# Patient Record
Sex: Female | Born: 1950 | Race: White | Hispanic: No | Marital: Married | Smoking: Never smoker
Health system: Southern US, Community
[De-identification: ages and names within clinical notes are randomized; demographics above are authoritative.]

## PROBLEM LIST (undated history)

## (undated) DIAGNOSIS — K589 Irritable bowel syndrome without diarrhea: Secondary | ICD-10-CM

## (undated) DIAGNOSIS — F32A Depression, unspecified: Secondary | ICD-10-CM

## (undated) DIAGNOSIS — K219 Gastro-esophageal reflux disease without esophagitis: Secondary | ICD-10-CM

## (undated) DIAGNOSIS — E079 Disorder of thyroid, unspecified: Secondary | ICD-10-CM

## (undated) DIAGNOSIS — E7800 Pure hypercholesterolemia, unspecified: Secondary | ICD-10-CM

## (undated) DIAGNOSIS — E785 Hyperlipidemia, unspecified: Secondary | ICD-10-CM

## (undated) DIAGNOSIS — F329 Major depressive disorder, single episode, unspecified: Secondary | ICD-10-CM

## (undated) DIAGNOSIS — I639 Cerebral infarction, unspecified: Secondary | ICD-10-CM

## (undated) DIAGNOSIS — M1990 Unspecified osteoarthritis, unspecified site: Secondary | ICD-10-CM

## (undated) DIAGNOSIS — I10 Essential (primary) hypertension: Secondary | ICD-10-CM

## (undated) DIAGNOSIS — E039 Hypothyroidism, unspecified: Secondary | ICD-10-CM

## (undated) HISTORY — DX: Cerebral infarction, unspecified: I63.9

## (undated) HISTORY — PX: TRIGGER FINGER RELEASE: SHX641

## (undated) HISTORY — DX: Hyperlipidemia, unspecified: E78.5

## (undated) HISTORY — PX: CHOLECYSTECTOMY: SHX55

## (undated) HISTORY — PX: APPENDECTOMY: SHX54

## (undated) HISTORY — PX: THYROIDECTOMY: SHX17

## (undated) HISTORY — PX: ABDOMINAL HYSTERECTOMY: SHX81

## (undated) HISTORY — PX: EYE SURGERY: SHX253

---

## 2014-10-27 DIAGNOSIS — Z1211 Encounter for screening for malignant neoplasm of colon: Secondary | ICD-10-CM | POA: Diagnosis not present

## 2014-10-27 DIAGNOSIS — K219 Gastro-esophageal reflux disease without esophagitis: Secondary | ICD-10-CM | POA: Diagnosis not present

## 2014-10-27 DIAGNOSIS — Z1239 Encounter for other screening for malignant neoplasm of breast: Secondary | ICD-10-CM | POA: Diagnosis not present

## 2014-10-27 DIAGNOSIS — R000 Tachycardia, unspecified: Secondary | ICD-10-CM | POA: Diagnosis not present

## 2014-10-29 DIAGNOSIS — R000 Tachycardia, unspecified: Secondary | ICD-10-CM | POA: Diagnosis not present

## 2014-11-29 DIAGNOSIS — L570 Actinic keratosis: Secondary | ICD-10-CM | POA: Diagnosis not present

## 2014-11-29 DIAGNOSIS — L578 Other skin changes due to chronic exposure to nonionizing radiation: Secondary | ICD-10-CM | POA: Diagnosis not present

## 2014-11-29 DIAGNOSIS — Z85828 Personal history of other malignant neoplasm of skin: Secondary | ICD-10-CM | POA: Diagnosis not present

## 2014-12-06 DIAGNOSIS — Z1211 Encounter for screening for malignant neoplasm of colon: Secondary | ICD-10-CM | POA: Diagnosis not present

## 2014-12-06 DIAGNOSIS — Z85828 Personal history of other malignant neoplasm of skin: Secondary | ICD-10-CM | POA: Diagnosis not present

## 2014-12-06 DIAGNOSIS — D139 Benign neoplasm of ill-defined sites within the digestive system: Secondary | ICD-10-CM | POA: Diagnosis not present

## 2014-12-06 DIAGNOSIS — D123 Benign neoplasm of transverse colon: Secondary | ICD-10-CM | POA: Diagnosis not present

## 2014-12-06 DIAGNOSIS — K5190 Ulcerative colitis, unspecified, without complications: Secondary | ICD-10-CM | POA: Diagnosis not present

## 2014-12-06 DIAGNOSIS — K633 Ulcer of intestine: Secondary | ICD-10-CM | POA: Diagnosis not present

## 2014-12-06 DIAGNOSIS — D126 Benign neoplasm of colon, unspecified: Secondary | ICD-10-CM | POA: Diagnosis not present

## 2014-12-06 DIAGNOSIS — Z86010 Personal history of colonic polyps: Secondary | ICD-10-CM | POA: Diagnosis not present

## 2014-12-27 DIAGNOSIS — R197 Diarrhea, unspecified: Secondary | ICD-10-CM | POA: Diagnosis not present

## 2014-12-27 DIAGNOSIS — E780 Pure hypercholesterolemia: Secondary | ICD-10-CM | POA: Diagnosis not present

## 2014-12-27 DIAGNOSIS — R000 Tachycardia, unspecified: Secondary | ICD-10-CM | POA: Diagnosis not present

## 2014-12-27 DIAGNOSIS — K219 Gastro-esophageal reflux disease without esophagitis: Secondary | ICD-10-CM | POA: Diagnosis not present

## 2014-12-27 DIAGNOSIS — I10 Essential (primary) hypertension: Secondary | ICD-10-CM | POA: Diagnosis not present

## 2014-12-27 DIAGNOSIS — G4700 Insomnia, unspecified: Secondary | ICD-10-CM | POA: Diagnosis not present

## 2014-12-27 DIAGNOSIS — F329 Major depressive disorder, single episode, unspecified: Secondary | ICD-10-CM | POA: Diagnosis not present

## 2014-12-27 DIAGNOSIS — E890 Postprocedural hypothyroidism: Secondary | ICD-10-CM | POA: Diagnosis not present

## 2014-12-29 DIAGNOSIS — L259 Unspecified contact dermatitis, unspecified cause: Secondary | ICD-10-CM | POA: Diagnosis not present

## 2014-12-29 DIAGNOSIS — D485 Neoplasm of uncertain behavior of skin: Secondary | ICD-10-CM | POA: Diagnosis not present

## 2014-12-29 DIAGNOSIS — L570 Actinic keratosis: Secondary | ICD-10-CM | POA: Diagnosis not present

## 2014-12-29 DIAGNOSIS — L821 Other seborrheic keratosis: Secondary | ICD-10-CM | POA: Diagnosis not present

## 2014-12-29 DIAGNOSIS — Z85828 Personal history of other malignant neoplasm of skin: Secondary | ICD-10-CM | POA: Diagnosis not present

## 2014-12-29 DIAGNOSIS — L720 Epidermal cyst: Secondary | ICD-10-CM | POA: Diagnosis not present

## 2014-12-29 DIAGNOSIS — Z808 Family history of malignant neoplasm of other organs or systems: Secondary | ICD-10-CM | POA: Diagnosis not present

## 2015-01-04 ENCOUNTER — Ambulatory Visit: Payer: Self-pay | Admitting: Interventional Cardiology

## 2015-01-24 DIAGNOSIS — L570 Actinic keratosis: Secondary | ICD-10-CM | POA: Diagnosis not present

## 2015-01-25 ENCOUNTER — Encounter: Payer: Self-pay | Admitting: Interventional Cardiology

## 2015-03-05 DIAGNOSIS — J019 Acute sinusitis, unspecified: Secondary | ICD-10-CM | POA: Diagnosis not present

## 2015-03-05 DIAGNOSIS — H109 Unspecified conjunctivitis: Secondary | ICD-10-CM | POA: Diagnosis not present

## 2015-03-05 DIAGNOSIS — J3089 Other allergic rhinitis: Secondary | ICD-10-CM | POA: Diagnosis not present

## 2015-03-15 DIAGNOSIS — K219 Gastro-esophageal reflux disease without esophagitis: Secondary | ICD-10-CM | POA: Diagnosis not present

## 2015-03-15 DIAGNOSIS — Z86010 Personal history of colonic polyps: Secondary | ICD-10-CM | POA: Diagnosis not present

## 2015-03-15 DIAGNOSIS — R197 Diarrhea, unspecified: Secondary | ICD-10-CM | POA: Diagnosis not present

## 2015-03-21 DIAGNOSIS — R197 Diarrhea, unspecified: Secondary | ICD-10-CM | POA: Diagnosis not present

## 2015-03-24 DIAGNOSIS — L821 Other seborrheic keratosis: Secondary | ICD-10-CM | POA: Diagnosis not present

## 2015-03-24 DIAGNOSIS — Z411 Encounter for cosmetic surgery: Secondary | ICD-10-CM | POA: Diagnosis not present

## 2015-03-24 DIAGNOSIS — D489 Neoplasm of uncertain behavior, unspecified: Secondary | ICD-10-CM | POA: Diagnosis not present

## 2015-03-24 DIAGNOSIS — L570 Actinic keratosis: Secondary | ICD-10-CM | POA: Diagnosis not present

## 2015-04-04 DIAGNOSIS — L570 Actinic keratosis: Secondary | ICD-10-CM | POA: Diagnosis not present

## 2015-04-07 DIAGNOSIS — K589 Irritable bowel syndrome without diarrhea: Secondary | ICD-10-CM | POA: Diagnosis not present

## 2015-06-01 DIAGNOSIS — M545 Low back pain: Secondary | ICD-10-CM | POA: Diagnosis not present

## 2015-06-01 DIAGNOSIS — E780 Pure hypercholesterolemia: Secondary | ICD-10-CM | POA: Diagnosis not present

## 2015-06-01 DIAGNOSIS — G4700 Insomnia, unspecified: Secondary | ICD-10-CM | POA: Diagnosis not present

## 2015-06-01 DIAGNOSIS — K219 Gastro-esophageal reflux disease without esophagitis: Secondary | ICD-10-CM | POA: Diagnosis not present

## 2015-06-01 DIAGNOSIS — F339 Major depressive disorder, recurrent, unspecified: Secondary | ICD-10-CM | POA: Diagnosis not present

## 2015-06-01 DIAGNOSIS — E890 Postprocedural hypothyroidism: Secondary | ICD-10-CM | POA: Diagnosis not present

## 2015-06-01 DIAGNOSIS — R197 Diarrhea, unspecified: Secondary | ICD-10-CM | POA: Diagnosis not present

## 2015-06-01 DIAGNOSIS — I10 Essential (primary) hypertension: Secondary | ICD-10-CM | POA: Diagnosis not present

## 2015-07-27 DIAGNOSIS — R197 Diarrhea, unspecified: Secondary | ICD-10-CM | POA: Diagnosis not present

## 2015-07-27 DIAGNOSIS — R1084 Generalized abdominal pain: Secondary | ICD-10-CM | POA: Diagnosis not present

## 2015-07-27 DIAGNOSIS — Z86010 Personal history of colonic polyps: Secondary | ICD-10-CM | POA: Diagnosis not present

## 2015-07-27 DIAGNOSIS — R140 Abdominal distension (gaseous): Secondary | ICD-10-CM | POA: Diagnosis not present

## 2015-07-29 DIAGNOSIS — Z23 Encounter for immunization: Secondary | ICD-10-CM | POA: Diagnosis not present

## 2015-07-29 DIAGNOSIS — D225 Melanocytic nevi of trunk: Secondary | ICD-10-CM | POA: Diagnosis not present

## 2015-07-29 DIAGNOSIS — L570 Actinic keratosis: Secondary | ICD-10-CM | POA: Diagnosis not present

## 2015-07-29 DIAGNOSIS — D2271 Melanocytic nevi of right lower limb, including hip: Secondary | ICD-10-CM | POA: Diagnosis not present

## 2015-07-29 DIAGNOSIS — L820 Inflamed seborrheic keratosis: Secondary | ICD-10-CM | POA: Diagnosis not present

## 2015-07-29 DIAGNOSIS — Z808 Family history of malignant neoplasm of other organs or systems: Secondary | ICD-10-CM | POA: Diagnosis not present

## 2015-08-18 DIAGNOSIS — L570 Actinic keratosis: Secondary | ICD-10-CM | POA: Diagnosis not present

## 2015-08-24 DIAGNOSIS — K580 Irritable bowel syndrome with diarrhea: Secondary | ICD-10-CM | POA: Diagnosis not present

## 2015-10-12 DIAGNOSIS — L570 Actinic keratosis: Secondary | ICD-10-CM | POA: Diagnosis not present

## 2015-11-03 DIAGNOSIS — M545 Low back pain: Secondary | ICD-10-CM | POA: Diagnosis not present

## 2015-11-03 DIAGNOSIS — M65322 Trigger finger, left index finger: Secondary | ICD-10-CM | POA: Diagnosis not present

## 2015-11-03 DIAGNOSIS — M533 Sacrococcygeal disorders, not elsewhere classified: Secondary | ICD-10-CM | POA: Diagnosis not present

## 2015-11-10 DIAGNOSIS — M65332 Trigger finger, left middle finger: Secondary | ICD-10-CM | POA: Diagnosis not present

## 2015-11-14 DIAGNOSIS — M461 Sacroiliitis, not elsewhere classified: Secondary | ICD-10-CM | POA: Diagnosis not present

## 2015-12-05 DIAGNOSIS — M461 Sacroiliitis, not elsewhere classified: Secondary | ICD-10-CM | POA: Diagnosis not present

## 2015-12-19 DIAGNOSIS — M461 Sacroiliitis, not elsewhere classified: Secondary | ICD-10-CM | POA: Diagnosis not present

## 2015-12-19 DIAGNOSIS — M545 Low back pain: Secondary | ICD-10-CM | POA: Diagnosis not present

## 2015-12-29 DIAGNOSIS — Z6841 Body Mass Index (BMI) 40.0 and over, adult: Secondary | ICD-10-CM | POA: Diagnosis not present

## 2015-12-29 DIAGNOSIS — R635 Abnormal weight gain: Secondary | ICD-10-CM | POA: Diagnosis not present

## 2015-12-29 DIAGNOSIS — E890 Postprocedural hypothyroidism: Secondary | ICD-10-CM | POA: Diagnosis not present

## 2015-12-29 DIAGNOSIS — R8290 Unspecified abnormal findings in urine: Secondary | ICD-10-CM | POA: Diagnosis not present

## 2015-12-29 DIAGNOSIS — R232 Flushing: Secondary | ICD-10-CM | POA: Diagnosis not present

## 2015-12-29 DIAGNOSIS — R197 Diarrhea, unspecified: Secondary | ICD-10-CM | POA: Diagnosis not present

## 2016-01-19 ENCOUNTER — Ambulatory Visit: Payer: Medicare Other | Attending: Orthopaedic Surgery

## 2016-01-19 DIAGNOSIS — M5441 Lumbago with sciatica, right side: Secondary | ICD-10-CM

## 2016-01-19 DIAGNOSIS — M62838 Other muscle spasm: Secondary | ICD-10-CM | POA: Diagnosis not present

## 2016-01-19 DIAGNOSIS — R531 Weakness: Secondary | ICD-10-CM | POA: Diagnosis not present

## 2016-01-19 NOTE — Patient Instructions (Signed)
Knee to Chest (Flexion)   Pull knee toward chest. Feel stretch in lower back or buttock area. Breathing deeply, Hold _30___ seconds. Repeat with other knee. Repeat __3__ times. Do __3__ sessions per day.   Lumbar Rotation: Caudal - Bilateral (Supine)   Feet and knees together, arms outstretched, rock knees left and right, staying within shoulder distance ( man in picture is going too far) ,relaxing muscles of low back. Perform for 1 minute. Relax. Repeat _3___ times per set.  Do __3__ sessions per day.   Piriformis (Supine)  Cross legs, right on top. Gently pull other knee toward chest until stretch is felt in buttock/hip of top leg. Hold _30___ seconds. Repeat __3__ times per set. Do __3__ sets per session. Do __3__ sessions per day.  Hip Stretch  Put right ankle over left knee. Let right knee fall downward, but keep ankle in place. Feel the stretch in hip. May push down gently with hand to feel stretch. Hold _30___ seconds while counting out loud. Repeat with other leg. Repeat _3_ times. Do __3__ sessions per day.

## 2016-01-19 NOTE — Therapy (Addendum)
[place], [place], [address] Phone: [phone]   Fax:  [phone]  Physical Therapy Evaluation   Patient Details  Name: April Armstrong MRN: [id number] Date of Birth: 06-06-1951 Referring Provider: Joni Fears  Encounter Date: 01/19/2016      PT End of Session - 01/19/16 1451    Visit Number 1   Number of Visits 16   Date for PT Re-Evaluation 03/14/16   PT Start Time 1330   PT Stop Time 1450   PT Time Calculation (min) 80 min   Activity Tolerance Patient tolerated treatment well   Behavior During Therapy [facility] for tasks assessed/performed      No past medical history on file.  No past surgical history on file.  There were no vitals filed for this visit.  Visit Diagnosis:  Right-sided low back pain with right-sided sciatica - Plan: PT plan of care cert/re-cert  Weakness - Plan: PT plan of care cert/re-cert  Muscle spasm - Plan: PT plan of care cert/re-cert      Subjective Assessment - 01/19/16 1341    Subjective Pt has chronic LBP and started worsening >7 years ago. Pt underwent "shots" (ESI) for treatment 7 yeas ago, and again recently with Dr Cordelia Poche. Pain is worse with bending, vaccuming, sweeping. Better with sit to relieve pain. R leg weakness noted by patient.    Pertinent History thyroid, tremor, cholesterol, depression, and anxiety, IBS, R achilles tendon sx, 2003 tummy tuck    Limitations Walking   How long can you sit comfortably? not limited   How long can you stand comfortably? 5 mins   How long can you walk comfortably? 5 mins    Diagnostic tests X ray "Sacroiliac Arthritis"   Patient Stated Goals Be able to help with grandchildren, wash dishes, laundry, without pain.    Currently in Pain? Yes   Pain Score 2    Pain Location Back  hip and glute   Pain Orientation Left   Pain Descriptors / Indicators Aching;Cramping;Dull   Pain Type Acute pain   Pain Onset More than a month  ago   Pain Frequency Constant   Aggravating Factors  bending forward    Pain Relieving Factors heating pad, medication, use of TENs machine             [facility] PT Assessment - 01/19/16 0001    Assessment   Medical Diagnosis LBP   Referring Provider Joni Fears   Onset Date/Surgical Date 12/19/15   Hand Dominance Right   Next MD Visit 01/23/16   Prior Therapy ESI    Precautions   Precautions None   Restrictions   Weight Bearing Restrictions No   Balance Screen   Has the patient fallen in the past 6 months No   [place] residence   Prior Function   Level of Independence Independent   Cognition   Overall Cognitive Status Within Functional Limits for tasks assessed   Observation/Other Assessments   Focus on Therapeutic Outcomes (FOTO)  Intake: 58% limited, Predicted : 44% limited    ROM / Strength   AROM / PROM / Strength AROM;Strength   AROM   AROM Assessment Site Lumbar   Lumbar Flexion 90   Lumbar Extension 15   Lumbar - Right Side Bend 15   Lumbar - Left Side Bend 10   Lumbar - Right Rotation 30   Lumbar - Left Rotation 40   Strength   Strength  Assessment Site Hip;Knee;Ankle   Right/Left Hip Right;Left   Right Hip Flexion 3/5   Right Hip Extension 3+/5   Right Hip ABduction 3+/5   Left Hip Flexion 3+/5   Left Hip Extension 3/5   Left Hip ABduction 3/5   Right/Left Knee Right;Left   Right Knee Flexion 3+/5   Right Knee Extension 4/5   Left Knee Flexion 3+/5   Left Knee Extension 4/5   Right/Left Ankle Right;Left   Right Ankle Dorsiflexion 4/5   Right Ankle Plantar Flexion 3-/5   Left Ankle Dorsiflexion 4/5   Left Ankle Plantar Flexion 3/5   Palpation   Palpation comment PT at R QL, bilat piriformis and glute max   Special Tests    Special Tests --  SLS R: 8 secs, L: 4 secs                    [facility] Adult PT Treatment/Exercise - 01/19/16 0001    Self-Care   Self-Care --  see education section     Exercises   Exercises Lumbar   Lumbar Exercises: Stretches   Single Knee to Chest Stretch 3 reps;30 seconds  HEP   Lower Trunk Rotation 5 reps;10 seconds  HEP, with cues for breathing   Piriformis Stretch 1 rep;30 seconds  HEP, seated and supine    Manual Therapy   Manual Therapy Soft tissue mobilization   Soft tissue mobilization STM to lumbar and QL glutes and piriformis                PT Education - 01/19/16 1450    Education provided Yes   Education Details PT POC, HEP: SKTC, piriformis stretch, LTR ADL's;Posture;Heat/Ice Application;Other Self-Care Comments  Extensive Education on positions and activity to avoid, positioning during the day, proper posture, types of chairs/ furniture to sit on, PT POC, HEP. Pt demonstrated and verbalized understanding.      Person(s) Educated Patient   Methods Explanation;Handout   Comprehension Verbalized understanding;Returned demonstration;Need further instruction          PT Short Term Goals - 01/19/16 1501    PT SHORT TERM GOAL #1   Title Pt will be able to demo posture and body mechanics as it relates to lumbar spine by 02/16/16.    Time 4   Period Weeks   Status New   PT SHORT TERM GOAL #2   Title Pt will be I with initial HEP for continued strengthening and mobility by 02/16/16    Time 4   Period Weeks   Status New           PT Long Term Goals - 01/19/16 1502    PT LONG TERM GOAL #1   Title FOTO score will improve from 58% limited to 44% to demo improved function and mobility by 03/14/16.     Time 8   Period Weeks   Status New   PT LONG TERM GOAL #2   Title Pt will tolerate washing dishes for 10 mins without pain by 03/14/16.     Time 8   Period Weeks   Status New   PT LONG TERM GOAL #3   Title Bilat hip flexion, extension, and abduction strength will improve to 4-/5 for improved standing and walking  tolerance to 20 mins without increased pain by 03/14/16.    Time 8   Period Weeks   Status New   PT LONG  TERM GOAL #4   Title R trunk rotation will improve to  40 degrees in order for pt to perform hygiene activities with R UE by 03/14/16.    Time 4   Period Weeks   Status New               Plan - 02-11-16 1452    Clinical Impression Statement Pt presents for moderate complexity evaluation for back pain. Pain is better with sitting and worse with bending, vaccuming, and lifting. Pt presents with impairments including pain, impaired mobility/ROM, and impaired hip and core strength, which limit pt's functional abilities with standing, walking, bending, lifting. Pt will benefit from oupt PT for 2 times a week for 8 weeks for core and LE strengthening, stretching, pt education in order to address these impairments and functional limitations and return pt to pain-free PLOF.    Pt will benefit from skilled therapeutic intervention in order to improve on the following deficits Decreased activity tolerance;Decreased mobility;Decreased strength;Difficulty walking;Decreased range of motion;Decreased endurance;Impaired flexibility;Increased muscle spasms;Pain;Obesity;Impaired perceived functional ability   Rehab Potential Good   PT Frequency 2x / week   PT Duration 8 weeks   PT Treatment/Interventions ADLs/Self Care Home Management;Ultrasound;Cryotherapy;Moist Heat;Traction;Therapeutic exercise;Therapeutic activities;Functional mobility training;Neuromuscular re-education;Dry needling;Taping;Manual techniques;Patient/family education;Passive range of motion   PT Next Visit Plan Review effects of HEP ( if incresaed pain, switch to extension- based program).   Progress with basic back exercises, core strengthening, and hip strengthening. MT to lumbar paraspinals and piriformis. Body mechanics education.     PT Home Exercise Plan SKTC, piriformis stretch, LTR   Consulted and Agree with Plan of Care Patient          G-Codes - 02-11-16 1513    Functional Assessment Tool Used FOTO    Functional Limitation  Mobility: Walking and moving around   Mobility: Walking and Moving Around Current Status [id number]) At least 40 percent but less than 60 percent impaired, limited or restricted   Mobility: Walking and Moving Around Goal Status [id number]) At least 20 percent but less than 40 percent impaired, limited or restricted       Problem List There are no active problems to display for this patient.   Dollene Cleveland, PT 11-Feb-2016, 3:52 PM  [facility] [address] [place], [place], [address] Phone: [phone]   Fax:  [phone]  Name: Keileen Bride MRN: [id number] Date of Birth: 1950-12-09   Pt did not return after initial evaluation - reported she was waiting on an MRI.  Romualdo Bolk, PT, DPT 07/05/16 11:11 AM Phone: [phone] Fax: [phone]

## 2016-01-23 DIAGNOSIS — M545 Low back pain: Secondary | ICD-10-CM | POA: Diagnosis not present

## 2016-01-26 ENCOUNTER — Other Ambulatory Visit: Payer: Self-pay | Admitting: Orthopaedic Surgery

## 2016-01-26 DIAGNOSIS — M545 Low back pain: Secondary | ICD-10-CM

## 2016-01-30 ENCOUNTER — Encounter: Payer: Medicare Other | Admitting: Physical Therapy

## 2016-02-01 ENCOUNTER — Encounter: Payer: Medicare Other | Admitting: Physical Therapy

## 2016-02-06 ENCOUNTER — Encounter: Payer: Medicare Other | Admitting: Physical Therapy

## 2016-02-07 ENCOUNTER — Ambulatory Visit
Admission: RE | Admit: 2016-02-07 | Discharge: 2016-02-07 | Disposition: A | Discharge status: Home / Self Care | Payer: Medicare Other | Source: Ambulatory Visit | Attending: Orthopaedic Surgery | Admitting: Orthopaedic Surgery

## 2016-02-07 DIAGNOSIS — M5126 Other intervertebral disc displacement, lumbar region: Secondary | ICD-10-CM | POA: Diagnosis not present

## 2016-02-07 DIAGNOSIS — M545 Low back pain: Secondary | ICD-10-CM

## 2016-02-08 ENCOUNTER — Encounter: Payer: Medicare Other | Admitting: Physical Therapy

## 2016-02-09 DIAGNOSIS — L570 Actinic keratosis: Secondary | ICD-10-CM | POA: Diagnosis not present

## 2016-02-09 DIAGNOSIS — L821 Other seborrheic keratosis: Secondary | ICD-10-CM | POA: Diagnosis not present

## 2016-02-09 DIAGNOSIS — L851 Acquired keratosis [keratoderma] palmaris et plantaris: Secondary | ICD-10-CM | POA: Diagnosis not present

## 2016-02-13 ENCOUNTER — Encounter: Payer: Medicare Other | Admitting: Physical Therapy

## 2016-02-15 ENCOUNTER — Encounter: Payer: Medicare Other | Admitting: Physical Therapy

## 2016-02-20 ENCOUNTER — Encounter: Payer: Medicare Other | Admitting: Physical Therapy

## 2016-02-20 DIAGNOSIS — M545 Low back pain: Secondary | ICD-10-CM | POA: Diagnosis not present

## 2016-02-22 ENCOUNTER — Encounter: Payer: Medicare Other | Admitting: Physical Therapy

## 2016-02-22 DIAGNOSIS — M47816 Spondylosis without myelopathy or radiculopathy, lumbar region: Secondary | ICD-10-CM | POA: Diagnosis not present

## 2016-02-22 DIAGNOSIS — M545 Low back pain: Secondary | ICD-10-CM | POA: Diagnosis not present

## 2016-03-01 DIAGNOSIS — M545 Low back pain: Secondary | ICD-10-CM | POA: Diagnosis not present

## 2016-03-01 DIAGNOSIS — M47816 Spondylosis without myelopathy or radiculopathy, lumbar region: Secondary | ICD-10-CM | POA: Diagnosis not present

## 2016-03-02 DIAGNOSIS — R197 Diarrhea, unspecified: Secondary | ICD-10-CM | POA: Diagnosis not present

## 2016-03-02 DIAGNOSIS — R635 Abnormal weight gain: Secondary | ICD-10-CM | POA: Diagnosis not present

## 2016-03-02 DIAGNOSIS — E890 Postprocedural hypothyroidism: Secondary | ICD-10-CM | POA: Diagnosis not present

## 2016-03-02 DIAGNOSIS — R232 Flushing: Secondary | ICD-10-CM | POA: Diagnosis not present

## 2016-03-26 DIAGNOSIS — R197 Diarrhea, unspecified: Secondary | ICD-10-CM | POA: Diagnosis not present

## 2016-03-26 DIAGNOSIS — M545 Low back pain: Secondary | ICD-10-CM | POA: Diagnosis not present

## 2016-03-26 DIAGNOSIS — E890 Postprocedural hypothyroidism: Secondary | ICD-10-CM | POA: Diagnosis not present

## 2016-03-26 DIAGNOSIS — E7800 Pure hypercholesterolemia, unspecified: Secondary | ICD-10-CM | POA: Diagnosis not present

## 2016-03-26 DIAGNOSIS — F339 Major depressive disorder, recurrent, unspecified: Secondary | ICD-10-CM | POA: Diagnosis not present

## 2016-03-26 DIAGNOSIS — F419 Anxiety disorder, unspecified: Secondary | ICD-10-CM | POA: Diagnosis not present

## 2016-03-26 DIAGNOSIS — I10 Essential (primary) hypertension: Secondary | ICD-10-CM | POA: Diagnosis not present

## 2016-03-26 DIAGNOSIS — K219 Gastro-esophageal reflux disease without esophagitis: Secondary | ICD-10-CM | POA: Diagnosis not present

## 2016-03-26 DIAGNOSIS — G4700 Insomnia, unspecified: Secondary | ICD-10-CM | POA: Diagnosis not present

## 2016-04-04 DIAGNOSIS — M545 Low back pain: Secondary | ICD-10-CM | POA: Diagnosis not present

## 2016-04-04 DIAGNOSIS — M47816 Spondylosis without myelopathy or radiculopathy, lumbar region: Secondary | ICD-10-CM | POA: Diagnosis not present

## 2016-04-11 DIAGNOSIS — L570 Actinic keratosis: Secondary | ICD-10-CM | POA: Diagnosis not present

## 2016-04-12 ENCOUNTER — Encounter (HOSPITAL_COMMUNITY): Payer: Self-pay | Admitting: *Deleted

## 2016-04-12 ENCOUNTER — Inpatient Hospital Stay (HOSPITAL_COMMUNITY)
Admission: EM | Admit: 2016-04-12 | Discharge: 2016-04-14 | DRG: 066 | Disposition: A | Discharge status: Home / Self Care | Payer: Medicare Other | Attending: Internal Medicine | Admitting: Internal Medicine

## 2016-04-12 ENCOUNTER — Emergency Department (HOSPITAL_COMMUNITY): Payer: Medicare Other

## 2016-04-12 DIAGNOSIS — M5481 Occipital neuralgia: Secondary | ICD-10-CM | POA: Insufficient documentation

## 2016-04-12 DIAGNOSIS — R519 Headache, unspecified: Secondary | ICD-10-CM

## 2016-04-12 DIAGNOSIS — R51 Headache: Secondary | ICD-10-CM

## 2016-04-12 DIAGNOSIS — E785 Hyperlipidemia, unspecified: Secondary | ICD-10-CM | POA: Diagnosis not present

## 2016-04-12 DIAGNOSIS — E039 Hypothyroidism, unspecified: Secondary | ICD-10-CM | POA: Diagnosis present

## 2016-04-12 DIAGNOSIS — F329 Major depressive disorder, single episode, unspecified: Secondary | ICD-10-CM | POA: Diagnosis not present

## 2016-04-12 DIAGNOSIS — R42 Dizziness and giddiness: Secondary | ICD-10-CM

## 2016-04-12 DIAGNOSIS — I639 Cerebral infarction, unspecified: Secondary | ICD-10-CM | POA: Diagnosis not present

## 2016-04-12 DIAGNOSIS — E7800 Pure hypercholesterolemia, unspecified: Secondary | ICD-10-CM

## 2016-04-12 DIAGNOSIS — F419 Anxiety disorder, unspecified: Secondary | ICD-10-CM | POA: Diagnosis present

## 2016-04-12 DIAGNOSIS — K219 Gastro-esophageal reflux disease without esophagitis: Secondary | ICD-10-CM | POA: Diagnosis present

## 2016-04-12 DIAGNOSIS — R52 Pain, unspecified: Secondary | ICD-10-CM

## 2016-04-12 DIAGNOSIS — F32A Depression, unspecified: Secondary | ICD-10-CM | POA: Diagnosis present

## 2016-04-12 DIAGNOSIS — I10 Essential (primary) hypertension: Secondary | ICD-10-CM | POA: Diagnosis present

## 2016-04-12 DIAGNOSIS — K589 Irritable bowel syndrome without diarrhea: Secondary | ICD-10-CM | POA: Diagnosis present

## 2016-04-12 HISTORY — DX: Irritable bowel syndrome, unspecified: K58.9

## 2016-04-12 HISTORY — DX: Depression, unspecified: F32.A

## 2016-04-12 HISTORY — DX: Disorder of thyroid, unspecified: E07.9

## 2016-04-12 HISTORY — DX: Essential (primary) hypertension: I10

## 2016-04-12 HISTORY — DX: Pure hypercholesterolemia, unspecified: E78.00

## 2016-04-12 HISTORY — DX: Major depressive disorder, single episode, unspecified: F32.9

## 2016-04-12 HISTORY — DX: Hypothyroidism, unspecified: E03.9

## 2016-04-12 HISTORY — DX: Gastro-esophageal reflux disease without esophagitis: K21.9

## 2016-04-12 LAB — BASIC METABOLIC PANEL
Anion gap: 7 (ref 5–15)
BUN: 21 mg/dL — AB (ref 6–20)
CHLORIDE: 105 mmol/L (ref 101–111)
CO2: 28 mmol/L (ref 22–32)
CREATININE: 0.77 mg/dL (ref 0.44–1.00)
Calcium: 9.4 mg/dL (ref 8.9–10.3)
GFR calc Af Amer: >60 mL/min (ref >60)
GLUCOSE: 97 mg/dL (ref 65–99)
POTASSIUM: 4 mmol/L (ref 3.5–5.1)
Sodium: 140 mmol/L (ref 135–145)

## 2016-04-12 LAB — CBC
HCT: 41.6 % (ref 36.0–46.0)
Hemoglobin: 13.9 g/dL (ref 12.0–15.0)
MCH: 31.6 pg (ref 26.0–34.0)
MCHC: 33.4 g/dL (ref 30.0–36.0)
MCV: 94.5 fL (ref 78.0–100.0)
PLATELETS: 236 10*3/uL (ref 150–400)
RBC: 4.4 MIL/uL (ref 3.87–5.11)
RDW: 12.6 % (ref 11.5–15.5)
WBC: 8.6 10*3/uL (ref 4.0–10.5)

## 2016-04-12 LAB — CBG MONITORING, ED: Glucose-Capillary: 87 mg/dL (ref 65–99)

## 2016-04-12 LAB — PROTIME-INR
INR: 1.06 (ref 0.00–1.49)
Prothrombin Time: 13.6 seconds (ref 11.6–15.2)

## 2016-04-12 MED ORDER — METOPROLOL SUCCINATE ER 25 MG PO TB24
25.0000 mg | ORAL_TABLET | Freq: Every day | ORAL | Status: DC
  Administered 2016-04-13 – 2016-04-14 (×2): 25 mg via ORAL
  Filled 2016-04-12 (×3): qty 1

## 2016-04-12 MED ORDER — BUPROPION HCL ER (XL) 150 MG PO TB24
300.0000 mg | ORAL_TABLET | Freq: Every day | ORAL | Status: DC
  Administered 2016-04-13 – 2016-04-14 (×2): 300 mg via ORAL
  Filled 2016-04-12: qty 2
  Filled 2016-04-12 (×2): qty 1

## 2016-04-12 MED ORDER — CYCLOSPORINE 0.05 % OP EMUL
1.0000 [drp] | Freq: Two times a day (BID) | OPHTHALMIC | Status: DC
  Administered 2016-04-13 – 2016-04-14 (×4): 1 [drp] via OPHTHALMIC
  Filled 2016-04-12 (×6): qty 1

## 2016-04-12 MED ORDER — LEVOTHYROXINE SODIUM 100 MCG PO TABS
100.0000 ug | ORAL_TABLET | Freq: Every day | ORAL | Status: DC
  Administered 2016-04-13 – 2016-04-14 (×2): 100 ug via ORAL
  Filled 2016-04-12 (×4): qty 1

## 2016-04-12 MED ORDER — LORATADINE 10 MG PO TABS: 10.0000 mg | ORAL_TABLET | Freq: Every day | ORAL | Status: DC | PRN

## 2016-04-12 MED ORDER — ASPIRIN 81 MG PO CHEW: 324.0000 mg | CHEWABLE_TABLET | Freq: Once | ORAL | Status: DC

## 2016-04-12 MED ORDER — PANTOPRAZOLE SODIUM 40 MG PO TBEC
40.0000 mg | DELAYED_RELEASE_TABLET | Freq: Every day | ORAL | Status: DC
  Administered 2016-04-13 – 2016-04-14 (×2): 40 mg via ORAL
  Filled 2016-04-12 (×2): qty 1

## 2016-04-12 MED ORDER — ELUXADOLINE 75 MG PO TABS: 75.0000 mg | ORAL_TABLET | Freq: Every day | ORAL | Status: DC

## 2016-04-12 MED ORDER — PRENATAL MULTIVITAMIN CH
1.0000 | ORAL_TABLET | Freq: Every day | ORAL | Status: DC
  Administered 2016-04-14: 1 via ORAL
  Filled 2016-04-12 (×2): qty 1

## 2016-04-12 MED ORDER — MECLIZINE HCL 25 MG PO TABS: 25.0000 mg | ORAL_TABLET | Freq: Three times a day (TID) | ORAL | Status: DC | PRN

## 2016-04-12 MED ORDER — SERTRALINE HCL 100 MG PO TABS
100.0000 mg | ORAL_TABLET | Freq: Every day | ORAL | Status: DC
Start: 2016-04-13 — End: 2016-04-14
  Administered 2016-04-13 – 2016-04-14 (×2): 100 mg via ORAL
  Filled 2016-04-12 (×2): qty 1

## 2016-04-12 MED ORDER — LORAZEPAM 0.5 MG PO TABS
0.5000 mg | ORAL_TABLET | Freq: Four times a day (QID) | ORAL | Status: DC | PRN
  Administered 2016-04-13: 0.5 mg via ORAL
  Filled 2016-04-12: qty 1

## 2016-04-12 MED ORDER — ATORVASTATIN CALCIUM 40 MG PO TABS
40.0000 mg | ORAL_TABLET | Freq: Every day | ORAL | Status: DC
  Administered 2016-04-13 (×2): 40 mg via ORAL
  Filled 2016-04-12 (×4): qty 1

## 2016-04-12 MED ORDER — SODIUM CHLORIDE 0.9 % IV SOLN
INTRAVENOUS | Status: DC
Start: 2016-04-12 — End: 2016-04-13
  Administered 2016-04-13: 75 mL/h via INTRAVENOUS

## 2016-04-12 MED ORDER — TRAZODONE HCL 50 MG PO TABS
75.0000 mg | ORAL_TABLET | Freq: Every day | ORAL | Status: DC
  Administered 2016-04-13 (×2): 75 mg via ORAL
  Filled 2016-04-12 (×2): qty 2

## 2016-04-12 MED ORDER — VITAMIN B-12 100 MCG PO TABS
100.0000 ug | ORAL_TABLET | Freq: Every day | ORAL | Status: DC
  Administered 2016-04-13 (×2): 100 ug via ORAL
  Filled 2016-04-12 (×3): qty 1

## 2016-04-12 MED ORDER — LAMOTRIGINE 100 MG PO TABS
100.0000 mg | ORAL_TABLET | Freq: Every day | ORAL | Status: DC
  Administered 2016-04-13 – 2016-04-14 (×2): 100 mg via ORAL
  Filled 2016-04-12 (×3): qty 1

## 2016-04-12 MED ORDER — LORAZEPAM 2 MG/ML IJ SOLN
1.0000 mg | Freq: Once | INTRAMUSCULAR | Status: AC
  Administered 2016-04-12: 1 mg via INTRAVENOUS
  Filled 2016-04-12: qty 1

## 2016-04-12 MED ORDER — DIAZEPAM 5 MG PO TABS: 2.5000 mg | ORAL_TABLET | Freq: Four times a day (QID) | ORAL | Status: DC | PRN

## 2016-04-12 MED ORDER — MECLIZINE HCL 25 MG PO TABS
25.0000 mg | ORAL_TABLET | Freq: Once | ORAL | Status: AC
  Administered 2016-04-12: 25 mg via ORAL
  Filled 2016-04-12: qty 1

## 2016-04-12 NOTE — Discharge Instructions (Signed)
Benign Positional Vertigo °Vertigo is the feeling that you or your surroundings are moving when they are not. Benign positional vertigo is the most common form of vertigo. The cause of this condition is not serious (is benign). This condition is triggered by certain movements and positions (is positional). This condition can be dangerous if it occurs while you are doing something that could endanger you or others, such as driving.  °CAUSES °In many cases, the cause of this condition is not known. It may be caused by a disturbance in an area of the inner ear that helps your brain to sense movement and balance. This disturbance can be caused by a viral infection (labyrinthitis), head injury, or repetitive motion. °RISK FACTORS °This condition is more likely to develop in: °· Women. °· People who are 50 years of age or older. °SYMPTOMS °Symptoms of this condition usually happen when you move your head or your eyes in different directions. Symptoms may start suddenly, and they usually last for less than a minute. Symptoms may include: °· Loss of balance and falling. °· Feeling like you are spinning or moving. °· Feeling like your surroundings are spinning or moving. °· Nausea and vomiting. °· Blurred vision. °· Dizziness. °· Involuntary eye movement (nystagmus). °Symptoms can be mild and cause only slight annoyance, or they can be severe and interfere with daily life. Episodes of benign positional vertigo may return (recur) over time, and they may be triggered by certain movements. Symptoms may improve over time. °DIAGNOSIS °This condition is usually diagnosed by medical history and a physical exam of the head, neck, and ears. You may be referred to a health care provider who specializes in ear, nose, and throat (ENT) problems (otolaryngologist) or a provider who specializes in disorders of the nervous system (neurologist). You may have additional testing, including: °· MRI. °· A CT scan. °· Eye movement tests. Your  health care provider may ask you to change positions quickly while he or she watches you for symptoms of benign positional vertigo, such as nystagmus. Eye movement may be tested with an electronystagmogram (ENG), caloric stimulation, the Dix-Hallpike test, or the roll test. °· An electroencephalogram (EEG). This records electrical activity in your brain. °· Hearing tests. °TREATMENT °Usually, your health care provider will treat this by moving your head in specific positions to adjust your inner ear back to normal. Surgery may be needed in severe cases, but this is rare. In some cases, benign positional vertigo may resolve on its own in 2-4 weeks. °HOME CARE INSTRUCTIONS °Safety °· Move slowly. Avoid sudden body or head movements. °· Avoid driving. °· Avoid operating heavy machinery. °· Avoid doing any tasks that would be dangerous to you or others if a vertigo episode would occur. °· If you have trouble walking or keeping your balance, try using a cane for stability. If you feel dizzy or unstable, sit down right away. °· Return to your normal activities as told by your health care provider. Ask your health care provider what activities are safe for you. °General Instructions °· Take over-the-counter and prescription medicines only as told by your health care provider. °· Avoid certain positions or movements as told by your health care provider. °· Drink enough fluid to keep your urine clear or pale yellow. °· Keep all follow-up visits as told by your health care provider. This is important. °SEEK MEDICAL CARE IF: °· You have a fever. °· Your condition gets worse or you develop new symptoms. °· Your family or friends   notice any behavioral changes.  Your nausea or vomiting gets worse.  You have numbness or a "pins and needles" sensation. SEEK IMMEDIATE MEDICAL CARE IF:  You have difficulty speaking or moving.  You are always dizzy.  You faint.  You develop severe headaches.  You have weakness in your  legs or arms.  You have changes in your hearing or vision.  You develop a stiff neck.  You develop sensitivity to light.   This information is not intended to replace advice given to you by your health care provider. Make sure you discuss any questions you have with your health care provider.   Document Released: 08/20/2006 Document Revised: 08/03/2015 Document Reviewed: 03/07/2015 Elsevier Interactive Patient Education 2016 [employer] General Headache Without Cause A headache is pain or discomfort felt around the head or neck area. The specific cause of a headache may not be found. There are many causes and types of headaches. A few common ones are:  Tension headaches.  Migraine headaches.  Cluster headaches.  Chronic daily headaches. HOME CARE INSTRUCTIONS  Watch your condition for any changes. Take these steps to help with your condition: Managing Pain  Take over-the-counter and prescription medicines only as told by your health care provider.  Lie down in a dark, quiet room when you have a headache.  If directed, apply ice to the head and neck area:  Put ice in a plastic bag.  Place a towel between your skin and the bag.  Leave the ice on for 20 minutes, 2-3 times per day.  Use a heating pad or hot shower to apply heat to the head and neck area as told by your health care provider.  Keep lights dim if bright lights bother you or make your headaches worse. Eating and Drinking  Eat meals on a regular schedule.  Limit alcohol use.  Decrease the amount of caffeine you drink, or stop drinking caffeine. General Instructions  Keep all follow-up visits as told by your health care provider. This is important.  Keep a headache journal to help find out what may trigger your headaches. For example, write down:  What you eat and drink.  How much sleep you get.  Any change to your diet or medicines.  Try massage or other relaxation techniques.  Limit  stress.  Sit up straight, and do not tense your muscles.  Do not use tobacco products, including cigarettes, chewing tobacco, or e-cigarettes. If you need help quitting, ask your health care provider.  Exercise regularly as told by your health care provider.  Sleep on a regular schedule. Get 7-9 hours of sleep, or the amount recommended by your health care provider. SEEK MEDICAL CARE IF:   Your symptoms are not helped by medicine.  You have a headache that is different from the usual headache.  You have nausea or you vomit.  You have a fever. SEEK IMMEDIATE MEDICAL CARE IF:   Your headache becomes severe.  You have repeated vomiting.  You have a stiff neck.  You have a loss of vision.  You have problems with speech.  You have pain in the eye or ear.  You have muscular weakness or loss of muscle control.  You lose your balance or have trouble walking.  You feel faint or pass out.  You have confusion.   This information is not intended to replace advice given to you by your health care provider. Make sure you discuss any questions you have with your health care provider.  Document Released: 11/12/2005 Document Revised: 08/03/2015 Document Reviewed: 03/07/2015 Elsevier Interactive Patient Education [employer].

## 2016-04-12 NOTE — ED Provider Notes (Signed)
CSN: [id number]     Arrival date & time 04/12/16  1729 History   First MD Initiated Contact with Patient 04/12/16 1934     Chief Complaint  Patient presents with  . Dizziness     (Consider location/radiation/quality/duration/timing/severity/associated sxs/prior Treatment) HPI Patient states that she has been having some degree of dizziness which has an off-balance quality to it. She is somewhat equivocal on the severity of the symptoms at onset. She identifies falling 5 days ago due to dizziness with leaning in a certain direction. She hit the coffee table with her side but is not aware of having hit her head. She notes that yesterday morning she awoke with the fairly severe posterior headache. She reports she's had it all day yesterday and has persisted today. She reports that she continues to have a sensation of things not catching up with her vision. If she turns her head and looks to the side it seems like everything is delayed her off. She reports it feels like vertigo. She states however she is never personally had a history of vertigo. She denies any loss of vision, no focal weakness numbness or tingling of extremities. No slurred speech, no vomiting. She has not had fever or neck stiffness. She has not been ill recently. Past Medical History  Diagnosis Date  . Thyroid disease   . GERD (gastroesophageal reflux disease)   . Hypercholesterolemia   . Hypertension    Past Surgical History  Procedure Laterality Date  . Trigger finger release    . Cesarean section    . Abdominal hysterectomy    . Cholecystectomy    . Thyroidectomy    . Appendectomy     No family history on file. Social History  Substance Use Topics  . Smoking status: Never Smoker   . Smokeless tobacco: None  . Alcohol Use: No   OB History    No data available     Review of Systems 10 Systems reviewed and are negative for acute change except as noted in the HPI.    Allergies  Review of patient's allergies  indicates no known allergies.  Home Medications   Prior to Admission medications   Medication Sig Start Date End Date Taking? Authorizing Provider  atorvastatin (LIPITOR) 40 MG tablet Take 40 mg by mouth at bedtime.  04/10/16  Yes Historical Provider, MD  buPROPion (WELLBUTRIN XL) 300 MG 24 hr tablet Take 300 mg by mouth daily.  03/28/16  Yes Historical Provider, MD  Eluxadoline (VIBERZI PO) Take 75-150 mg by mouth daily. Pt takes 1 or 2 tablets depending on IBS condition.   Yes Historical Provider, MD  ibuprofen (ADVIL,MOTRIN) 200 MG tablet Take 800 mg by mouth every 6 (six) hours as needed for moderate pain.   Yes Historical Provider, MD  lamoTRIgine (LAMICTAL) 100 MG tablet Take 100 mg by mouth daily.  02/10/16  Yes Historical Provider, MD  levothyroxine (SYNTHROID, LEVOTHROID) 100 MCG tablet Take 100 mcg by mouth daily before breakfast.  03/28/16  Yes Historical Provider, MD  lisinopril-hydrochlorothiazide (PRINZIDE,ZESTORETIC) 20-12.5 MG tablet Take 1 tablet by mouth daily.  04/02/16  Yes Historical Provider, MD  loratadine (CLARITIN) 10 MG tablet Take 10 mg by mouth daily as needed for allergies (pain).   Yes Historical Provider, MD  LORazepam (ATIVAN) 0.5 MG tablet Take 0.5 mg by mouth every 6 (six) hours as needed for anxiety.  03/27/16  Yes Historical Provider, MD  meloxicam (MOBIC) 15 MG tablet Take 15 mg by mouth daily.  02/05/16  Yes Historical Provider, MD  metoprolol succinate (TOPROL-XL) 25 MG 24 hr tablet Take 25 mg by mouth daily.  02/10/16  Yes Historical Provider, MD  omeprazole (PRILOSEC) 40 MG capsule Take 40 mg by mouth daily.  04/09/16  Yes Historical Provider, MD  Prenatal Vit-Fe Fumarate-FA (PRENATAL MULTIVITAMIN) TABS tablet Take 1 tablet by mouth daily at 12 noon.   Yes Historical Provider, MD  RESTASIS 0.05 % ophthalmic emulsion Place 1 drop into both eyes 2 (two) times daily.  02/15/16  Yes Historical Provider, MD  sertraline (ZOLOFT) 100 MG tablet Take 100 mg by mouth daily.   03/28/16  Yes Historical Provider, MD  spironolactone (ALDACTONE) 25 MG tablet Take 25 mg by mouth daily.  01/09/16  Yes Historical Provider, MD  traZODone (DESYREL) 150 MG tablet Take 75 mg by mouth at bedtime.  02/05/16  Yes Historical Provider, MD  vitamin B-12 (CYANOCOBALAMIN) 100 MCG tablet Take 100 mcg by mouth at bedtime.   Yes Historical Provider, MD  diazepam (VALIUM) 5 MG tablet Take 0.5 tablets (2.5 mg total) by mouth every 6 (six) hours as needed (VERTIGO that is not relieved by meclizine.). 04/12/16   Charlesetta Shanks, MD  meclizine (ANTIVERT) 25 MG tablet Take 1 tablet (25 mg total) by mouth 3 (three) times daily as needed for dizziness. 04/12/16   Charlesetta Shanks, MD   BP 112/73 mmHg  Pulse 73  Temp(Src) 98.2 F (36.8 C) (Oral)  Resp 20  SpO2 96% Physical Exam  Constitutional: She is oriented to person, place, and time. She appears well-developed and well-nourished.  Patient is alert and nontoxic. She has no respiratory distress.  HENT:  Head: Normocephalic and atraumatic.  Right Ear: External ear normal.  Left Ear: External ear normal.  Nose: Nose normal.  Mouth/Throat: Oropharynx is clear and moist.  Bilateral TMs normal without erythema or bulging. Posterior oropharynx widely patent.  Eyes: EOM are normal. Pupils are equal, round, and reactive to light.  Neck: Neck supple.  Cardiovascular: Normal rate, regular rhythm, normal heart sounds and intact distal pulses.   Pulmonary/Chest: Effort normal and breath sounds normal.  Abdominal: Soft. Bowel sounds are normal. She exhibits no distension. There is no tenderness.  Musculoskeletal: Normal range of motion. She exhibits no edema or tenderness.  Neurological: She is alert and oriented to person, place, and time. She has normal strength. No cranial nerve deficit. She exhibits normal muscle tone. Coordination normal. GCS eye subscore is 4. GCS verbal subscore is 5. GCS motor subscore is 6.  Patient's motor strength is 5 out of 5  upper and lower. Questionable very subtle incoordination with finger-nose examination more noticeable on the left. No gross or obvious past-pointing or tremor. She appropriately performs heel-to-shin examination bilaterally.  Skin: Skin is warm, dry and intact.  Psychiatric: She has a normal mood and affect.    ED Course  Procedures (including critical care time) Labs Review Labs Reviewed  BASIC METABOLIC PANEL - Abnormal; Notable for the following:    BUN 21 (*)    All other components within normal limits  CBC  URINALYSIS, ROUTINE W REFLEX MICROSCOPIC (NOT AT [facility])  CBG MONITORING, ED    Imaging Review No results found. I have personally reviewed and evaluated these images and lab results as part of my medical decision-making.   EKG Interpretation None     Consult:[facility] hospitalist Dr. Donna Bernard.   MDM   Final diagnoses:  Vertigo  Acute nonintractable headache, unspecified headache type    Patient  has symptoms of dizziness and vertigo which started approximately 5 days ago. The patient does not describe these as being severely debilitating. She did however develop a headache yesterday that she qualifies as very painful. Her mental status is clear. Patient is nontoxic. Due to concern for possible cerebellar stroke or vertebrobasilar dissection given the patient's headache history in conjunction with vertigo, MRI/MRA has been obtained.Results indicate small, acute infarct. Plan will be for admission for CVA that occurred greater than 48 hours without focal motor or cognitive dysfunction.    Charlesetta Shanks, MD 04/14/16 [phone]

## 2016-04-12 NOTE — ED Notes (Signed)
Patient transported to MRI 

## 2016-04-12 NOTE — ED Notes (Signed)
Pt reports off balance which started Sunday that caused her to fall, then dizziness started Tuesday and h/a which started yesterday which gotten worse today.  Pt denies any unilateral weakness, facial droop, slurred speech at this time.  Pt is A&O x 4.

## 2016-04-12 NOTE — ED Notes (Signed)
Patient tried to give sample but dropped in in commode

## 2016-04-12 NOTE — H&P (Addendum)
History and Physical    April Armstrong [id number] DOB: 1951-08-11 DOA: 04/12/2016  Referring MD/NP/PA:   PCP: No primary care provider on file.   Patient coming from:  The patient is coming from home.  At his baseline, she is independent for most of his ADL.  Chief Complaint: Poor balance and dizziness  HPI: April Armstrong is a 65 y.o. female with medical history significant of hypertension, HLD, GERD, hypothyroidism, depression, IBS, who presents with poor balance and dizziness.  Patient reports that she has been having poor balance and dizziness for about 4-5 days. She had fall on Sunday, without any injury. Patient does not have unilateral weakness, numbness or tingling in her extremities. No slurred speech, vision change or hearing loss. She reports headache over occipital area, which is moderate, constant and nonradiating. No neck stiffness.  Patient does not have fever, chills, chest pain, shortness of breath, cough, nausea, vomiting, symptoms of UTI. Patient states that she has chronic diarrhea due to IBS, which has not changed recently.  ED Course: pt was found to have WBC 8.6, temperature normal, no tachycardia, electrolytes and renal function okay. Pt is placed on tele bed for obs. Neurology was consulted.  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 No associated hemorrhage or mass effect; mild generalized age-related cerebral atrophy; chronic small vessel ischemic disease. MRA HEAD showed no large or proximal arterial branch occlusion within the intracranial circulation. No high-grade or correctable stenosis. Azygos ACA, which is largely supplied via the left internal carotid artery system. Right ICA slightly diminutive, likely related to hypoplastic right A1 segment. Mild distal small vessel atheromatous irregularity within the MCA and PCA branches bilaterally. p  Review of Systems:   General: no fevers, chills, no changes  in body weight, has fatigue and HA. HEENT: no blurry vision, hearing changes or sore throat Pulm: no dyspnea, coughing, wheezing CV: no chest pain, no palpitations Abd: no nausea, vomiting, abdominal pain, has diarrhea, no constipation GU: no dysuria, burning on urination, increased urinary frequency, hematuria  Ext: no leg edema Neuro: no unilateral weakness, numbness, or tingling, no vision change or hearing loss. Has dizziness and poor balance. Skin: no rash MSK: No muscle spasm, no deformity, no limitation of range of movement in spin Heme: No easy bruising.  Travel history: No recent long distant travel.  Allergy: No Known Allergies  Past Medical History  Diagnosis Date  . Thyroid disease   . GERD (gastroesophageal reflux disease)   . Hypercholesterolemia   . Hypertension   . Depression   . Hypothyroidism   . IBS (irritable bowel syndrome)     Past Surgical History  Procedure Laterality Date  . Trigger finger release    . Cesarean section    . Abdominal hysterectomy    . Cholecystectomy    . Thyroidectomy    . Appendectomy      Social History:  reports that she has never smoked. She does not have any smokeless tobacco history on file. She reports that she does not drink alcohol or use illicit drugs.  Family History:  Family History  Problem Relation Age of Onset  . Dementia Mother   . Hypercholesterolemia Mother   . Heart disease Father   . Stroke Father   . Heart disease Brother   . Congestive Heart Failure Sister      Prior to Admission medications   Medication Sig Start Date End Date Taking? Authorizing Provider  atorvastatin (LIPITOR) 40 MG tablet  Take 40 mg by mouth at bedtime.  04/10/16  Yes Historical Provider, MD  buPROPion (WELLBUTRIN XL) 300 MG 24 hr tablet Take 300 mg by mouth daily.  03/28/16  Yes Historical Provider, MD  Eluxadoline (VIBERZI PO) Take 75-150 mg by mouth daily. Pt takes 1 or 2 tablets depending on IBS condition.   Yes Historical  Provider, MD  ibuprofen (ADVIL,MOTRIN) 200 MG tablet Take 800 mg by mouth every 6 (six) hours as needed for moderate pain.   Yes Historical Provider, MD  lamoTRIgine (LAMICTAL) 100 MG tablet Take 100 mg by mouth daily.  02/10/16  Yes Historical Provider, MD  levothyroxine (SYNTHROID, LEVOTHROID) 100 MCG tablet Take 100 mcg by mouth daily before breakfast.  03/28/16  Yes Historical Provider, MD  lisinopril-hydrochlorothiazide (PRINZIDE,ZESTORETIC) 20-12.5 MG tablet Take 1 tablet by mouth daily.  04/02/16  Yes Historical Provider, MD  loratadine (CLARITIN) 10 MG tablet Take 10 mg by mouth daily as needed for allergies (pain).   Yes Historical Provider, MD  LORazepam (ATIVAN) 0.5 MG tablet Take 0.5 mg by mouth every 6 (six) hours as needed for anxiety.  03/27/16  Yes Historical Provider, MD  meloxicam (MOBIC) 15 MG tablet Take 15 mg by mouth daily.  02/05/16  Yes Historical Provider, MD  metoprolol succinate (TOPROL-XL) 25 MG 24 hr tablet Take 25 mg by mouth daily.  02/10/16  Yes Historical Provider, MD  omeprazole (PRILOSEC) 40 MG capsule Take 40 mg by mouth daily.  04/09/16  Yes Historical Provider, MD  Prenatal Vit-Fe Fumarate-FA (PRENATAL MULTIVITAMIN) TABS tablet Take 1 tablet by mouth daily at 12 noon.   Yes Historical Provider, MD  RESTASIS 0.05 % ophthalmic emulsion Place 1 drop into both eyes 2 (two) times daily.  02/15/16  Yes Historical Provider, MD  sertraline (ZOLOFT) 100 MG tablet Take 100 mg by mouth daily.  03/28/16  Yes Historical Provider, MD  spironolactone (ALDACTONE) 25 MG tablet Take 25 mg by mouth daily.  01/09/16  Yes Historical Provider, MD  traZODone (DESYREL) 150 MG tablet Take 75 mg by mouth at bedtime.  02/05/16  Yes Historical Provider, MD  vitamin B-12 (CYANOCOBALAMIN) 100 MCG tablet Take 100 mcg by mouth at bedtime.   Yes Historical Provider, MD  diazepam (VALIUM) 5 MG tablet Take 0.5 tablets (2.5 mg total) by mouth every 6 (six) hours as needed (VERTIGO that is not relieved by  meclizine.). 04/12/16   Charlesetta Shanks, MD  meclizine (ANTIVERT) 25 MG tablet Take 1 tablet (25 mg total) by mouth 3 (three) times daily as needed for dizziness. 04/12/16   Charlesetta Shanks, MD    Physical Exam: Filed Vitals:   04/12/16 1831 04/12/16 2121 04/12/16 2304 04/12/16 2337  BP: 125/78 112/73 117/64 116/62  Pulse: 75 73 72 75  Temp: 98.2 F (36.8 C)   98.6 F (37 C)  TempSrc: Oral   Oral  Resp: 14 20 16 19   Height:    5\' 2"  (1.575 m)  Weight:    95.255 kg (210 lb)  SpO2: 95% 96% 97% 96%   General: Not in acute distress HEENT:       Eyes: PERRL, EOMI, no scleral icterus.       ENT: No discharge from the ears and nose, no pharynx injection, no tonsillar enlargement.        Neck: No JVD, no bruit, no mass felt. Heme: No neck lymph node enlargement. Cardiac: S1/S2, RRR, No murmurs, No gallops or rubs. Pulm: No rales, wheezing, rhonchi or rubs. Abd: Soft,  nondistended, nontender, no rebound pain, no organomegaly, BS present. GU: No hematuria Ext: No pitting leg edema bilaterally. 2+DP/PT pulse bilaterally. Musculoskeletal: No joint deformities, No joint redness or warmth, no limitation of ROM in spin. Skin: No rashes.  Neuro: Alert, oriented X3, cranial nerves II-XII grossly intact, moves all extremities normally. Muscle strength 5/5 in all extremities, sensation to light touch intact. Knee reflex 1+ bilaterally. Negative Babinski's sign. Normal finger to nose test. Psych: Patient is not psychotic, no suicidal or hemocidal ideation.  Labs on Admission: I have personally reviewed following labs and imaging studies  CBC:  Recent Labs Lab 04/12/16 1925  WBC 8.6  HGB 13.9  HCT 41.6  MCV 94.5  PLT [id number]   Basic Metabolic Panel:  Recent Labs Lab 04/12/16 1925  NA 140  K 4.0  CL 105  CO2 28  GLUCOSE 97  BUN 21*  CREATININE 0.77  CALCIUM 9.4   GFR: Estimated Creatinine Clearance: 74.5 mL/min (by C-G formula based on Cr of 0.77). Liver Function Tests: No results  for input(s): AST, ALT, ALKPHOS, BILITOT, PROT, ALBUMIN in the last 168 hours. No results for input(s): LIPASE, AMYLASE in the last 168 hours. No results for input(s): AMMONIA in the last 168 hours. Coagulation Profile:  Recent Labs Lab 04/12/16 1925  INR 1.06   Cardiac Enzymes: No results for input(s): CKTOTAL, CKMB, CKMBINDEX, TROPONINI in the last 168 hours. BNP (last 3 results) No results for input(s): PROBNP in the last 8760 hours. HbA1C: No results for input(s): HGBA1C in the last 72 hours. CBG:  Recent Labs Lab 04/12/16 1912  GLUCAP 87   Lipid Profile: No results for input(s): CHOL, HDL, LDLCALC, TRIG, CHOLHDL, LDLDIRECT in the last 72 hours. Thyroid Function Tests: No results for input(s): TSH, T4TOTAL, FREET4, T3FREE, THYROIDAB in the last 72 hours. Anemia Panel: No results for input(s): VITAMINB12, FOLATE, FERRITIN, TIBC, IRON, RETICCTPCT in the last 72 hours. Urine analysis: No results found for: COLORURINE, APPEARANCEUR, LABSPEC, PHURINE, GLUCOSEU, HGBUR, BILIRUBINUR, KETONESUR, PROTEINUR, UROBILINOGEN, NITRITE, LEUKOCYTESUR Sepsis Labs: @LABRCNTIP (procalcitonin:4,lacticidven:4) )No results found for this or any previous visit (from the past 240 hour(s)).   Radiological Exams on Admission: Mr Brain Wo Contrast (neuro Protocol)  04/12/2016  CLINICAL DATA:  Initial evaluation for acute vertigo. EXAM: MRI HEAD WITHOUT CONTRAST MRA HEAD WITHOUT CONTRAST TECHNIQUE: Multiplanar, multiecho pulse sequences of the brain and surrounding structures were obtained without intravenous contrast. Angiographic images of the head were obtained using MRA technique without contrast. COMPARISON:  None available. FINDINGS: MRI HEAD FINDINGS Diffuse prominence of the CSF containing spaces is compatible with generalized age-related cerebral atrophy. Patchy T2/FLAIR hyperintensity present within the periventricular, deep, and subcortical white matter of both cerebral hemispheres, nonspecific,  but most likely related to moderate chronic small vessel ischemic disease. Small abnormal restricted diffusion involving the cortical gray matter of the posterior right frontal lobe near the vertex (series 5, image 41). Finding consistent with small acute ischemic infarct. Infarct measures approximately 14 mm in length. This closely approximates the precentral gyrus. No associated hemorrhage or mass effect. No other acute infarct identified. Gray-white matter differentiation otherwise maintained. Major intracranial vascular flow voids are preserved. No acute or chronic intracranial hemorrhage. No areas of chronic infarction. No mass lesion, midline shift, or mass effect. No hydrocephalus. No extra-axial fluid collection. Major dural sinuses are grossly patent. Craniocervical junction within normal limits. Mild degenerative spondylolysis noted within the upper cervical spine without significant stenosis. Pituitary gland normal.  No acute abnormality about the orbits. Mild mucosal  thickening within the ethmoidal air cells. Paranasal sinuses are otherwise clear. No mastoid effusion. Inner ear structures grossly normal. Bone marrow signal intensity within normal limits. No scalp soft tissue abnormality. MRA HEAD FINDINGS ANTERIOR CIRCULATION: Visualized distal cervical segments of the internal carotid arteries are patent with antegrade flow. Right ICA somewhat diminutive as compared to the left. Petrous, cavernous, and supraclinoid segments widely patent. Right A1 segment hypoplastic but patent, likely accounting for the slightly diminutive right ICA. Dominant left A1 segment widely patent. Anterior communicating artery within normal limits. There appears to be an azygos ACA which is widely patent to its distal left. M1 segments patent without stenosis or occlusion. MCA bifurcations normal. No proximal M2 branch occlusion or stenosis. Distal MCA branches are symmetric but demonstrate multifocal atheromatous  irregularity. Vertebral arteries patent to the vertebrobasilar junction. Posterior inferior cerebral arteries not well evaluated on this exam. Basilar artery widely patent to its distal aspect. Superior cerebral arteries patent bilaterally. Both of the posterior cerebral arteries arise from the basilar artery and are well opacified to their distal aspects. Mild distal atheromatous irregularity within the PCA branches bilaterally. No aneurysm or vascular malformation. IMPRESSION: MRI HEAD IMPRESSION: 1. 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 No associated hemorrhage or mass effect. 2. No other acute intracranial process identified. 3. Mild generalized age-related cerebral atrophy. Multifocal patchy T2/FLAIR foci involving the cerebral white matter of both cerebral hemispheres is somewhat nonspecific, but most likely related to moderate chronic small vessel ischemic disease. MRA HEAD IMPRESSION: 1. No large or proximal arterial branch occlusion within the intracranial circulation. No high-grade or correctable stenosis. 2. Azygos ACA, which is largely supplied via the left internal carotid artery system. Right ICA slightly diminutive, likely related to hypoplastic right A1 segment. 3. Mild distal small vessel atheromatous irregularity within the MCA and PCA branches bilaterally. Electronically Signed   By: Jeannine Boga M.D.   On: 04/12/2016 22:55   Mr Virgel Paling (cerebral Arteries)  04/12/2016  CLINICAL DATA:  Initial evaluation for acute vertigo. EXAM: MRI HEAD WITHOUT CONTRAST MRA HEAD WITHOUT CONTRAST TECHNIQUE: Multiplanar, multiecho pulse sequences of the brain and surrounding structures were obtained without intravenous contrast. Angiographic images of the head were obtained using MRA technique without contrast. COMPARISON:  None available. FINDINGS: MRI HEAD FINDINGS Diffuse prominence of the CSF containing spaces is compatible with generalized age-related  cerebral atrophy. Patchy T2/FLAIR hyperintensity present within the periventricular, deep, and subcortical white matter of both cerebral hemispheres, nonspecific, but most likely related to moderate chronic small vessel ischemic disease. Small abnormal restricted diffusion involving the cortical gray matter of the posterior right frontal lobe near the vertex (series 5, image 41). Finding consistent with small acute ischemic infarct. Infarct measures approximately 14 mm in length. This closely approximates the precentral gyrus. No associated hemorrhage or mass effect. No other acute infarct identified. Gray-white matter differentiation otherwise maintained. Major intracranial vascular flow voids are preserved. No acute or chronic intracranial hemorrhage. No areas of chronic infarction. No mass lesion, midline shift, or mass effect. No hydrocephalus. No extra-axial fluid collection. Major dural sinuses are grossly patent. Craniocervical junction within normal limits. Mild degenerative spondylolysis noted within the upper cervical spine without significant stenosis. Pituitary gland normal.  No acute abnormality about the orbits. Mild mucosal thickening within the ethmoidal air cells. Paranasal sinuses are otherwise clear. No mastoid effusion. Inner ear structures grossly normal. Bone marrow signal intensity within normal limits. No scalp soft tissue abnormality. MRA HEAD FINDINGS ANTERIOR CIRCULATION:  Visualized distal cervical segments of the internal carotid arteries are patent with antegrade flow. Right ICA somewhat diminutive as compared to the left. Petrous, cavernous, and supraclinoid segments widely patent. Right A1 segment hypoplastic but patent, likely accounting for the slightly diminutive right ICA. Dominant left A1 segment widely patent. Anterior communicating artery within normal limits. There appears to be an azygos ACA which is widely patent to its distal left. M1 segments patent without stenosis or  occlusion. MCA bifurcations normal. No proximal M2 branch occlusion or stenosis. Distal MCA branches are symmetric but demonstrate multifocal atheromatous irregularity. Vertebral arteries patent to the vertebrobasilar junction. Posterior inferior cerebral arteries not well evaluated on this exam. Basilar artery widely patent to its distal aspect. Superior cerebral arteries patent bilaterally. Both of the posterior cerebral arteries arise from the basilar artery and are well opacified to their distal aspects. Mild distal atheromatous irregularity within the PCA branches bilaterally. No aneurysm or vascular malformation. IMPRESSION: MRI HEAD IMPRESSION: 1. 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 No associated hemorrhage or mass effect. 2. No other acute intracranial process identified. 3. Mild generalized age-related cerebral atrophy. Multifocal patchy T2/FLAIR foci involving the cerebral white matter of both cerebral hemispheres is somewhat nonspecific, but most likely related to moderate chronic small vessel ischemic disease. MRA HEAD IMPRESSION: 1. No large or proximal arterial branch occlusion within the intracranial circulation. No high-grade or correctable stenosis. 2. Azygos ACA, which is largely supplied via the left internal carotid artery system. Right ICA slightly diminutive, likely related to hypoplastic right A1 segment. 3. Mild distal small vessel atheromatous irregularity within the MCA and PCA branches bilaterally. Electronically Signed   By: Jeannine Boga M.D.   On: 04/12/2016 22:55     EKG: Independently reviewed. Sinus rhythm, QTC 461, mild T-wave inversion V2-V4.  Assessment/Plan Principal Problem:   Stroke [facility]) Active Problems:   Depression   Hypothyroidism   GERD (gastroesophageal reflux disease)   Hypercholesterolemia   Hypertension   IBS (irritable bowel syndrome)   Stroke [facility]): Patient's poor balance and dizziness are caused  by stroke. 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  MRA HEAD showed no large or proximal arterial branch occlusion within the intracranial circulation. No high-grade or correctable stenosis.  - will place on tele bed for obs - Neurology was consulted, with follow-up recommendations. - Risk factor modification: HgbA1c, fasting lipid panel  - PT consult, OT consult, Speech consult  - 2 d Echocardiogram  - Ekg  - Carotid dopplers  - Aspirin  Depression and anxiety: Stable, no suicidal or homicidal ideations. -Continue home medications: Wellbutrin when necessary Ativan  Hypothyroidism: Last TSH was not on record -Continue home Synthroid -Check TSH  GERD: -Protonix  HLD: Last LDL was not on record -Continue home medications: Lipitor -Check FLP  Hypertension: -Hold Prinzide and spironolactone to allow permissive hypertension -Continue metoprolol  IBS (irritable bowel syndrome): Patient has been followed up by Dr. Benson Norway. Still has diarrhea. She is on Viberzi -continue Biberzi  DVT ppx: SQ Lovenox Code Status: Full code Family Communication: Yes, patient's daughter and husband at bed side Disposition Plan:  Anticipate discharge back to previous home environment Consults called:  Neurology (EDP did not remember doctor's name, possibly Dr. Hollice Gong, on-call) Admission status: Obs / tele    Date of Service 04/12/2016    Ivor Costa [facility] Pager [phone]  If 7PM-7AM, please contact night-coverage [email or web address] Password [facility] 04/12/2016, 11:47 PM

## 2016-04-13 ENCOUNTER — Observation Stay (HOSPITAL_BASED_OUTPATIENT_CLINIC_OR_DEPARTMENT_OTHER): Payer: Medicare Other

## 2016-04-13 DIAGNOSIS — R51 Headache: Secondary | ICD-10-CM | POA: Diagnosis not present

## 2016-04-13 DIAGNOSIS — F419 Anxiety disorder, unspecified: Secondary | ICD-10-CM | POA: Diagnosis present

## 2016-04-13 DIAGNOSIS — I6789 Other cerebrovascular disease: Secondary | ICD-10-CM

## 2016-04-13 DIAGNOSIS — I10 Essential (primary) hypertension: Secondary | ICD-10-CM | POA: Insufficient documentation

## 2016-04-13 DIAGNOSIS — E039 Hypothyroidism, unspecified: Secondary | ICD-10-CM | POA: Diagnosis present

## 2016-04-13 DIAGNOSIS — M542 Cervicalgia: Secondary | ICD-10-CM | POA: Diagnosis not present

## 2016-04-13 DIAGNOSIS — I639 Cerebral infarction, unspecified: Secondary | ICD-10-CM

## 2016-04-13 DIAGNOSIS — I6300 Cerebral infarction due to thrombosis of unspecified precerebral artery: Secondary | ICD-10-CM | POA: Diagnosis not present

## 2016-04-13 DIAGNOSIS — M5481 Occipital neuralgia: Secondary | ICD-10-CM | POA: Diagnosis not present

## 2016-04-13 DIAGNOSIS — I63311 Cerebral infarction due to thrombosis of right middle cerebral artery: Secondary | ICD-10-CM | POA: Insufficient documentation

## 2016-04-13 DIAGNOSIS — E785 Hyperlipidemia, unspecified: Secondary | ICD-10-CM | POA: Diagnosis present

## 2016-04-13 DIAGNOSIS — K219 Gastro-esophageal reflux disease without esophagitis: Secondary | ICD-10-CM | POA: Diagnosis not present

## 2016-04-13 DIAGNOSIS — E7800 Pure hypercholesterolemia, unspecified: Secondary | ICD-10-CM | POA: Diagnosis not present

## 2016-04-13 DIAGNOSIS — I63411 Cerebral infarction due to embolism of right middle cerebral artery: Secondary | ICD-10-CM | POA: Diagnosis not present

## 2016-04-13 DIAGNOSIS — F329 Major depressive disorder, single episode, unspecified: Secondary | ICD-10-CM | POA: Diagnosis present

## 2016-04-13 DIAGNOSIS — S199XXA Unspecified injury of neck, initial encounter: Secondary | ICD-10-CM | POA: Diagnosis not present

## 2016-04-13 DIAGNOSIS — K589 Irritable bowel syndrome without diarrhea: Secondary | ICD-10-CM | POA: Diagnosis not present

## 2016-04-13 LAB — URINE MICROSCOPIC-ADD ON

## 2016-04-13 LAB — URINALYSIS, ROUTINE W REFLEX MICROSCOPIC
Bilirubin Urine: NEGATIVE
Glucose, UA: NEGATIVE mg/dL
HGB URINE DIPSTICK: NEGATIVE
Ketones, ur: NEGATIVE mg/dL
NITRITE: NEGATIVE
PROTEIN: NEGATIVE mg/dL
Specific Gravity, Urine: 1.023 (ref 1.005–1.030)
pH: 6 (ref 5.0–8.0)

## 2016-04-13 LAB — LIPID PANEL
CHOL/HDL RATIO: 4.1 ratio
Cholesterol: 167 mg/dL (ref 0–200)
HDL: 41 mg/dL (ref >40)
LDL Cholesterol: 92 mg/dL (ref 0–99)
Triglycerides: 172 mg/dL — ABNORMAL HIGH (ref <150)
VLDL: 34 mg/dL (ref 0–40)

## 2016-04-13 LAB — ECHOCARDIOGRAM COMPLETE
Height: 63 in
Weight: 3553.6 oz

## 2016-04-13 LAB — TSH: TSH: 3.067 u[IU]/mL (ref 0.350–4.500)

## 2016-04-13 MED ORDER — STROKE: EARLY STAGES OF RECOVERY BOOK
Freq: Once | Status: AC
  Administered 2016-04-13: 1
  Filled 2016-04-13: qty 1

## 2016-04-13 MED ORDER — ASPIRIN 325 MG PO TABS
325.0000 mg | ORAL_TABLET | Freq: Every day | ORAL | Status: DC
  Administered 2016-04-13 – 2016-04-14 (×3): 325 mg via ORAL
  Filled 2016-04-13 (×3): qty 1

## 2016-04-13 MED ORDER — SENNOSIDES-DOCUSATE SODIUM 8.6-50 MG PO TABS: 1.0000 | ORAL_TABLET | Freq: Every evening | ORAL | Status: DC | PRN

## 2016-04-13 MED ORDER — ENOXAPARIN SODIUM 40 MG/0.4ML ~~LOC~~ SOLN
40.0000 mg | Freq: Every day | SUBCUTANEOUS | Status: DC
  Administered 2016-04-13 (×2): 40 mg via SUBCUTANEOUS
  Filled 2016-04-13 (×3): qty 0.4

## 2016-04-13 MED ORDER — ASPIRIN 300 MG RE SUPP
300.0000 mg | Freq: Every day | RECTAL | Status: DC
  Filled 2016-04-13: qty 1

## 2016-04-13 MED ORDER — MORPHINE SULFATE (PF) 2 MG/ML IV SOLN: 1.0000 mg | Freq: Once | INTRAVENOUS | Status: DC

## 2016-04-13 MED ORDER — ACETAMINOPHEN 325 MG PO TABS
650.0000 mg | ORAL_TABLET | Freq: Four times a day (QID) | ORAL | Status: DC | PRN
  Administered 2016-04-13 – 2016-04-14 (×3): 650 mg via ORAL
  Filled 2016-04-13 (×3): qty 2

## 2016-04-13 NOTE — Progress Notes (Signed)
PROGRESS NOTE    April Armstrong  [id number] DOB: March 15, 1951 DOA: 04/12/2016 PCP: Dr.Mitchell Brief Narrative: is a 65 y.o. female with medical history significant of hypertension, HLD, GERD, hypothyroidism, depression, IBS, who presents with poor balance and dizziness. Patient reported that she has been having poor balance and dizziness for about 4-5 days. She had fall on Sunday, without any injury. MRI reported small acute R frontal infarct   Assessment & Plan:  CVA: small acute R frontal infarct -continue ASA, not on antiplatelets prior to admission -MRA without large vessel occlusions -NEuro consulted, d/w Dr.Nandigam recommended Tx to [facility] -FU HgbA1c, fasting lipid panel  -FU ECHO and Carotid duplex -PT consult, OT consult, Speech consult   Depression and anxiety: Stable, no suicidal or homicidal ideations. -Continue home medications: Wellbutrin when necessary Ativan  Hypothyroidism: Last TSH was not on record -Continue home Synthroid -TSH-WNL  GERD: -Protonix  HLD:  -Continue home medications: Lipitor -FU lipids  Hypertension: -Hold Prinzide and spironolactone to allow permissive hypertension -Continue metoprolol  IBS (irritable bowel syndrome): Patient has been followed up by Dr. Benson Norway, has chronic diarrhea -continue Viberzi  DVT ppx: SQ Lovenox  Code Status: Full code Family Communication:none at bed side Disposition Plan: Tx to [facility] per Dr.Nandigam    Consultants:  NEurology  Subjective: Feels ok, mild headache but improved Objective: Filed Vitals:   04/12/16 2352 04/13/16 0010 04/13/16 0158 04/13/16 0616  BP:  112/65 124/79 121/64  Pulse:  73 76 71  Temp: 98.6 F (37 C)  98.1 F (36.7 C) 97.7 F (36.5 C)  TempSrc:   Oral Oral  Resp:  22 16 18   Height:   5\' 3"  (1.6 m)   Weight:   100.744 kg (222 lb 1.6 oz)   SpO2:  96% 99% 94%   No intake or output data in the 24 hours ending 04/13/16 0914 Filed Weights   04/12/16 2337 04/13/16 0158    Weight: 95.255 kg (210 lb) 100.744 kg (222 lb 1.6 oz)    Examination:  General exam: Appears calm and comfortable, obese, no distress Respiratory system: Clear to auscultation. Respiratory effort normal. Cardiovascular system: S1 & S2 heard, RRR. No JVD, murmurs, rubs, gallops or clicks. No pedal edema. Gastrointestinal system: Abdomen is nondistended, soft and nontender. No organomegaly or masses felt. Normal bowel sounds heard. Central nervous system: Alert and oriented, motor 5/5 in all extremities, sensations-light touch intact, DTR 1plus, plantars withdrawal, gait not assessed, Extremities: Symmetric 5 x 5 power. Skin: No rashes, lesions or ulcers Psychiatry: Judgement and insight appear normal. Mood & affect appropriate.     Data Reviewed: I have personally reviewed following labs and imaging studies  CBC:  Recent Labs Lab 04/12/16 1925  WBC 8.6  HGB 13.9  HCT 41.6  MCV 94.5  PLT [id number]   Basic Metabolic Panel:  Recent Labs Lab 04/12/16 1925  NA 140  K 4.0  CL 105  CO2 28  GLUCOSE 97  BUN 21*  CREATININE 0.77  CALCIUM 9.4   GFR: Estimated Creatinine Clearance: 78.3 mL/min (by C-G formula based on Cr of 0.77). Liver Function Tests: No results for input(s): AST, ALT, ALKPHOS, BILITOT, PROT, ALBUMIN in the last 168 hours. No results for input(s): LIPASE, AMYLASE in the last 168 hours. No results for input(s): AMMONIA in the last 168 hours. Coagulation Profile:  Recent Labs Lab 04/12/16 1925  INR 1.06   Cardiac Enzymes: No results for input(s): CKTOTAL, CKMB, CKMBINDEX, TROPONINI in the last 168 hours. BNP (last 3  results) No results for input(s): PROBNP in the last 8760 hours. HbA1C: No results for input(s): HGBA1C in the last 72 hours. CBG:  Recent Labs Lab 04/12/16 1912  GLUCAP 87   Lipid Profile:  Recent Labs  04/13/16 0512  CHOL 167  HDL 41  LDLCALC 92  TRIG 172*  CHOLHDL 4.1   Thyroid Function Tests:  Recent Labs   04/13/16 0512  TSH 3.067   Anemia Panel: No results for input(s): VITAMINB12, FOLATE, FERRITIN, TIBC, IRON, RETICCTPCT in the last 72 hours. Urine analysis:    Component Value Date/Time   COLORURINE YELLOW 04/13/2016 0129   APPEARANCEUR CLEAR 04/13/2016 0129   LABSPEC 1.023 04/13/2016 0129   PHURINE 6.0 04/13/2016 0129   GLUCOSEU NEGATIVE 04/13/2016 0129   HGBUR NEGATIVE 04/13/2016 0129   BILIRUBINUR NEGATIVE 04/13/2016 0129   KETONESUR NEGATIVE 04/13/2016 0129   PROTEINUR NEGATIVE 04/13/2016 0129   NITRITE NEGATIVE 04/13/2016 0129   LEUKOCYTESUR TRACE* 04/13/2016 0129   Sepsis Labs: @LABRCNTIP (procalcitonin:4,lacticidven:4)  )No results found for this or any previous visit (from the past 240 hour(s)).       Radiology Studies: Mr Brain Wo Contrast (neuro Protocol)  04/12/2016  CLINICAL DATA:  Initial evaluation for acute vertigo. EXAM: MRI HEAD WITHOUT CONTRAST MRA HEAD WITHOUT CONTRAST TECHNIQUE: Multiplanar, multiecho pulse sequences of the brain and surrounding structures were obtained without intravenous contrast. Angiographic images of the head were obtained using MRA technique without contrast. COMPARISON:  None available. FINDINGS: MRI HEAD FINDINGS Diffuse prominence of the CSF containing spaces is compatible with generalized age-related cerebral atrophy. Patchy T2/FLAIR hyperintensity present within the periventricular, deep, and subcortical white matter of both cerebral hemispheres, nonspecific, but most likely related to moderate chronic small vessel ischemic disease. Small abnormal restricted diffusion involving the cortical gray matter of the posterior right frontal lobe near the vertex (series 5, image 41). Finding consistent with small acute ischemic infarct. Infarct measures approximately 14 mm in length. This closely approximates the precentral gyrus. No associated hemorrhage or mass effect. No other acute infarct identified. Gray-white matter differentiation  otherwise maintained. Major intracranial vascular flow voids are preserved. No acute or chronic intracranial hemorrhage. No areas of chronic infarction. No mass lesion, midline shift, or mass effect. No hydrocephalus. No extra-axial fluid collection. Major dural sinuses are grossly patent. Craniocervical junction within normal limits. Mild degenerative spondylolysis noted within the upper cervical spine without significant stenosis. Pituitary gland normal.  No acute abnormality about the orbits. Mild mucosal thickening within the ethmoidal air cells. Paranasal sinuses are otherwise clear. No mastoid effusion. Inner ear structures grossly normal. Bone marrow signal intensity within normal limits. No scalp soft tissue abnormality. MRA HEAD FINDINGS ANTERIOR CIRCULATION: Visualized distal cervical segments of the internal carotid arteries are patent with antegrade flow. Right ICA somewhat diminutive as compared to the left. Petrous, cavernous, and supraclinoid segments widely patent. Right A1 segment hypoplastic but patent, likely accounting for the slightly diminutive right ICA. Dominant left A1 segment widely patent. Anterior communicating artery within normal limits. There appears to be an azygos ACA which is widely patent to its distal left. M1 segments patent without stenosis or occlusion. MCA bifurcations normal. No proximal M2 branch occlusion or stenosis. Distal MCA branches are symmetric but demonstrate multifocal atheromatous irregularity. Vertebral arteries patent to the vertebrobasilar junction. Posterior inferior cerebral arteries not well evaluated on this exam. Basilar artery widely patent to its distal aspect. Superior cerebral arteries patent bilaterally. Both of the posterior cerebral arteries arise from the basilar artery and are  well opacified to their distal aspects. Mild distal atheromatous irregularity within the PCA branches bilaterally. No aneurysm or vascular malformation. IMPRESSION: MRI HEAD  IMPRESSION: 1. 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 No associated hemorrhage or mass effect. 2. No other acute intracranial process identified. 3. Mild generalized age-related cerebral atrophy. Multifocal patchy T2/FLAIR foci involving the cerebral white matter of both cerebral hemispheres is somewhat nonspecific, but most likely related to moderate chronic small vessel ischemic disease. MRA HEAD IMPRESSION: 1. No large or proximal arterial branch occlusion within the intracranial circulation. No high-grade or correctable stenosis. 2. Azygos ACA, which is largely supplied via the left internal carotid artery system. Right ICA slightly diminutive, likely related to hypoplastic right A1 segment. 3. Mild distal small vessel atheromatous irregularity within the MCA and PCA branches bilaterally. Electronically Signed   By: Jeannine Boga M.D.   On: 04/12/2016 22:55   Mr Virgel Paling (cerebral Arteries)  04/12/2016  CLINICAL DATA:  Initial evaluation for acute vertigo. EXAM: MRI HEAD WITHOUT CONTRAST MRA HEAD WITHOUT CONTRAST TECHNIQUE: Multiplanar, multiecho pulse sequences of the brain and surrounding structures were obtained without intravenous contrast. Angiographic images of the head were obtained using MRA technique without contrast. COMPARISON:  None available. FINDINGS: MRI HEAD FINDINGS Diffuse prominence of the CSF containing spaces is compatible with generalized age-related cerebral atrophy. Patchy T2/FLAIR hyperintensity present within the periventricular, deep, and subcortical white matter of both cerebral hemispheres, nonspecific, but most likely related to moderate chronic small vessel ischemic disease. Small abnormal restricted diffusion involving the cortical gray matter of the posterior right frontal lobe near the vertex (series 5, image 41). Finding consistent with small acute ischemic infarct. Infarct measures approximately 14 mm in length. This closely  approximates the precentral gyrus. No associated hemorrhage or mass effect. No other acute infarct identified. Gray-white matter differentiation otherwise maintained. Major intracranial vascular flow voids are preserved. No acute or chronic intracranial hemorrhage. No areas of chronic infarction. No mass lesion, midline shift, or mass effect. No hydrocephalus. No extra-axial fluid collection. Major dural sinuses are grossly patent. Craniocervical junction within normal limits. Mild degenerative spondylolysis noted within the upper cervical spine without significant stenosis. Pituitary gland normal.  No acute abnormality about the orbits. Mild mucosal thickening within the ethmoidal air cells. Paranasal sinuses are otherwise clear. No mastoid effusion. Inner ear structures grossly normal. Bone marrow signal intensity within normal limits. No scalp soft tissue abnormality. MRA HEAD FINDINGS ANTERIOR CIRCULATION: Visualized distal cervical segments of the internal carotid arteries are patent with antegrade flow. Right ICA somewhat diminutive as compared to the left. Petrous, cavernous, and supraclinoid segments widely patent. Right A1 segment hypoplastic but patent, likely accounting for the slightly diminutive right ICA. Dominant left A1 segment widely patent. Anterior communicating artery within normal limits. There appears to be an azygos ACA which is widely patent to its distal left. M1 segments patent without stenosis or occlusion. MCA bifurcations normal. No proximal M2 branch occlusion or stenosis. Distal MCA branches are symmetric but demonstrate multifocal atheromatous irregularity. Vertebral arteries patent to the vertebrobasilar junction. Posterior inferior cerebral arteries not well evaluated on this exam. Basilar artery widely patent to its distal aspect. Superior cerebral arteries patent bilaterally. Both of the posterior cerebral arteries arise from the basilar artery and are well opacified to their  distal aspects. Mild distal atheromatous irregularity within the PCA branches bilaterally. No aneurysm or vascular malformation. IMPRESSION: MRI HEAD IMPRESSION: 1. Small acute cortical ischemic infarct involving the posterior right frontal  lobe. No associated hemorrhage or mass effect. 2. No other acute intracranial process identified. 3. Mild generalized age-related cerebral atrophy. Multifocal patchy T2/FLAIR foci involving the cerebral white matter of both cerebral hemispheres is somewhat nonspecific, but most likely related to moderate chronic small vessel ischemic disease. MRA HEAD IMPRESSION: 1. No large or proximal arterial branch occlusion within the intracranial circulation. No high-grade or correctable stenosis. 2. Azygos ACA, which is largely supplied via the left internal carotid artery system. Right ICA slightly diminutive, likely related to hypoplastic right A1 segment. 3. Mild distal small vessel atheromatous irregularity within the MCA and PCA branches bilaterally. Electronically Signed   By: Jeannine Boga M.D.   On: 04/12/2016 22:55        Scheduled Meds: . aspirin  300 mg Rectal Daily   Or  . aspirin  325 mg Oral Daily  . atorvastatin  40 mg Oral QHS  . buPROPion  300 mg Oral Daily  . cycloSPORINE  1 drop Both Eyes BID  . Eluxadoline  75-150 mg Oral Daily  . enoxaparin (LOVENOX) injection  40 mg Subcutaneous QHS  . lamoTRIgine  100 mg Oral Daily  . levothyroxine  100 mcg Oral QAC breakfast  . metoprolol succinate  25 mg Oral Daily  .  morphine injection  1 mg Intravenous Once  . pantoprazole  40 mg Oral Daily  . prenatal multivitamin  1 tablet Oral Q1200  . sertraline  100 mg Oral Daily  . traZODone  75 mg Oral QHS  . vitamin B-12  100 mcg Oral QHS   Continuous Infusions:       Time spent: 42min    Domenic Polite, MD [facility] Pager [phone]  If 7PM-7AM, please contact night-coverage [email or web address] Password  [facility] 04/13/2016, 9:14 AM

## 2016-04-13 NOTE — Progress Notes (Signed)
VASCULAR LAB PRELIMINARY  PRELIMINARY  PRELIMINARY  PRELIMINARY  Carotid duplex  completed.    Preliminary report:  Bilateral:  1-39% ICA stenosis.  Vertebral artery flow is antegrade.      Ivyana Locey, RVT 04/13/2016, 10:20 AM

## 2016-04-13 NOTE — Progress Notes (Signed)
Patient passed the RN swallow screen in ED. PCP was notified so that the diet may be ordered.

## 2016-04-13 NOTE — Progress Notes (Signed)
Echocardiogram 2D Echocardiogram has been performed.  April Armstrong 04/13/2016, 3:41 PM

## 2016-04-13 NOTE — Progress Notes (Signed)
Speech Language Pathology Treatment:    Patient Details     Order for speech-language-cognitive assessment  received to start Sat 5/20     April Armstrong [place] M.Ed CCC-SLP Pager [place], Saud Bail Willis 04/13/2016, 10:30 AM

## 2016-04-13 NOTE — Progress Notes (Signed)
Pt being transferred to [facility] via [facility]. Report called to [employer], [facility] notified.

## 2016-04-13 NOTE — Consult Note (Signed)
Reason for consult: Stroke  HPI:  65 yo white female who had a fall several days ago at home after she slipped on some water.  She has had vertigo with headaches of occipital nature since then.  This has caused some imbalance.  She had no LOC when she fell, but it was a big fall.  She has remote history of migraines before she had her children.  She went to [facility] when it wasn't getting better and then transferred here.  She denies any numbness or weakness in any extremity.  She says she had a history of palpitations in the past and had a holter monitor detecting some arrhythmia but does not recall being told it was atrial fibrillation.  Telemetry so far has been normal sinus rhythm.    I reviewed her MRI Brain which shows an acute right frontal cortical infarct.  MRA Brain is normal.  Carotid Doppler ultrasound is normal.  TTE is normal.  LDL is 92, TG 172.  TSH normal.  She is a non-smoker, no illicit drugs, no diabetes.  BP well controlled.  She was not on anti-platelet treatment before this admission.  Meds: Reviewed, including ASA 325 mg qd, Lipitor 40 mg qd, Metoprolol XL 25 mg qd  Neuro exam is normal, except for vertigo with some head turns and imbalance while walking.    Strength 5/5 bilateral upper and lower extremities. No pronator drift. Sensation is intact. Finger to nose, heel to shin, and rapid alternating movements are intact.    A/P:  1. Vertigo, which is most likely post-concussive vertigo in combination with the headache.  Migrainous is a remote possibility but less likely given features.  2. Acute right frontal cortical infarct, without any physical sequelae.  It is essentially asymptomatic.  The vertigo would not be related to an infarct in that location.   She does not have any clear uncontrolled stroke risk factors.  There is a possibility of cardioembolims due to paroxysmal atrial fibrillation which has not been identified during this admission yet.  I recommend at least 30 days  of holter monitoring after discharge if nothing is found during telemetry here.  Consideration can be given to implanting a cardiac loop recording which can stay in for 3 years as well.  If atrial fibrillation is found, her management will change to anticoagulation.

## 2016-04-13 NOTE — Progress Notes (Signed)
[facility] progress note. Chief complaint. Transfer note. History of present illness. This 65 year old female with a history of hypertension, hypothyroidism, depression, irritable while presented to [facility] long emergency room poor balance and dizziness. MRI was obtained showing a small right acute frontal infarct. Case was discussed with neurology who requested patient be transferred to [facility] for further evaluation and treatment. The patient has arrived in transfer and I'm seeing her at bedside to ensure she remains clinically stable and that her orders transferred here appropriately. Patient complains of dizziness with change of position and a low-grade headache with cervical neck pain which she has chronically. Physical exam. Vital signs. Temperature 98.2, pulse 66, respiration 18, blood pressure 124/60. O2 sats 94%. General appearance. Obese female who is alert and in no distress. Cardiac. Rate and rhythm regular. Lungs. Breath sounds clear and equal. Abdomen. Soft and obese with positive bowel sounds. No pain. Neurologic. Cranial nerves II through XII grossly intact. No unilateral or focal defects seen. Impression/plan. Problem #1. Small acute right frontal infarct. I will notify neurology of patient's arrival. Patient for follow-up hemoglobin A1c, fasting lipid panel, 2-D echo, carotid Doppler, and therapy consults. Problem #2. Depression and anxiety. Continue home medications. Problem #3. Hypothyroidism. Continue Synthroid. TSH is within normal limits. Problem #4. GERD. Treated with Protonix. Problem #5. Hyperlipidemia. Continue home statin. Problem #6. Hypertension. Permissive hypertension. Hold Prinzide and spironolactone. Continue metoprolol. Problem #7. Irritable bowel syndrome. Continue home medication. Patient appears clinically stable post transfer. All orders appear to of transferred here appropriately.

## 2016-04-13 NOTE — ED Provider Notes (Signed)
Discussed briefly with Dr. Cristobal Goldmann, on call for neurology. He confirms that patient can be kept at [facility] for stroke workup. Neurology consultation in the a.m.  Orpah Greek, MD 04/13/16 [phone]

## 2016-04-14 ENCOUNTER — Inpatient Hospital Stay (HOSPITAL_COMMUNITY): Payer: Medicare Other

## 2016-04-14 DIAGNOSIS — I63411 Cerebral infarction due to embolism of right middle cerebral artery: Secondary | ICD-10-CM

## 2016-04-14 DIAGNOSIS — M5481 Occipital neuralgia: Secondary | ICD-10-CM | POA: Insufficient documentation

## 2016-04-14 LAB — HEMOGLOBIN A1C
Hgb A1c MFr Bld: 6.2 % — ABNORMAL HIGH (ref 4.8–5.6)
MEAN PLASMA GLUCOSE: 131 mg/dL

## 2016-04-14 LAB — VITAMIN B12: VITAMIN B 12: 1137 pg/mL — AB (ref 180–914)

## 2016-04-14 MED ORDER — ASPIRIN 325 MG PO TABS: 325.0000 mg | ORAL_TABLET | Freq: Every day | ORAL | Status: DC

## 2016-04-14 MED ORDER — ATORVASTATIN CALCIUM 80 MG PO TABS: 80.0000 mg | ORAL_TABLET | Freq: Every day | ORAL | Status: DC

## 2016-04-14 MED ORDER — LIDOCAINE HCL (PF) 2 % IJ SOLN
10.0000 mL | Freq: Once | INTRAMUSCULAR | Status: DC
  Filled 2016-04-14: qty 10

## 2016-04-14 MED ORDER — METHOCARBAMOL 500 MG PO TABS: 500.0000 mg | ORAL_TABLET | Freq: Four times a day (QID) | ORAL | Status: DC | PRN

## 2016-04-14 MED ORDER — DEXAMETHASONE SODIUM PHOSPHATE 4 MG/ML IJ SOLN
8.0000 mg | Freq: Once | INTRAMUSCULAR | Status: DC
  Filled 2016-04-14: qty 2

## 2016-04-14 NOTE — Procedures (Signed)
Procedure Date:  04/14/2016 Procedure: Occipital Nerve Block, bi  Pre-procedure Diagnosis: Headache  Post-procedure Diagnosis: same as above  Prior to Procedure:  Informed Consent: The risks, benefits, indications, potential complications, and alternatives were explained to the patient/family and informed consent obtained.  Attending Staff:  Dr. Rosalin Hawking Skin Prep: Cleansed with alcohol.  Anesthesia: 9ml Lidocaine 1% without epinephrine without added sodium bicarbonate, decadron 51ml (4mg ) Indications: She is a 65 yo F here for headache. The identity of the patient was confirmed and a bedside time out was performed.  Description of Procedure: Pt's skin was prepped with alcohol and 4cc lidocaine 2% and 1cc decadron 4mg /ml was injected over the occipital ridge in a fan-like fashion with appropriate procedure bilaterally. Pt had immediate relief.  Findings: immediate HA relief  Complications: The patient tolerated the procedure well with no complications.  Specimens:none  Estimated blood loss: zero   Rosalin Hawking, MD PhD Stroke Neurology 04/14/2016 4:07 PM

## 2016-04-14 NOTE — Progress Notes (Signed)
STROKE TEAM PROGRESS NOTE   HISTORY OF PRESENT ILLNESS (per record) 65 yo white female who had a fall several days ago at home after she slipped on some water. She has had vertigo with headaches of occipital nature since then. This has caused some imbalance. She had no LOC when she fell, but it was a big fall. She has remote history of migraines before she had her children. She went to [facility] when it wasn't getting better and then transferred here. She denies any numbness or weakness in any extremity. She says she had a history of palpitations in the past and had a holter monitor detecting some arrhythmia but does not recall being told it was atrial fibrillation. Telemetry so far has been normal sinus rhythm.   I reviewed her MRI Brain which shows an acute right frontal cortical infarct. MRA Brain is normal. Carotid Doppler ultrasound is normal. TTE is normal. LDL is 92, TG 172. TSH normal. She is a non-smoker, no illicit drugs, no diabetes. BP well controlled. She was not on anti-platelet treatment before this admission.   SUBJECTIVE (INTERVAL HISTORY) Her husband and daughter are at the bedside.  Overall she feels her condition is  stable. She still complains of occipital HA, neck pain and lightheadedness. MRI showed right frontal punctate infarct not correlating to presenting symptoms. Exam showed occipital neuralgia, will do occipital nerve block.     OBJECTIVE Temp:  [98 F (36.7 C)-98.2 F (36.8 C)] 98 F (36.7 C) (05/20 0508) Pulse Rate:  [66-77] 69 (05/20 0508) Cardiac Rhythm:  [-] Normal sinus rhythm (05/19 1900) Resp:  [16-18] 16 (05/20 0508) BP: (100-129)/(53-63) 129/63 mmHg (05/20 0508) SpO2:  [93 %-97 %] 96 % (05/20 0508) Weight:  [101.379 kg (223 lb 8 oz)] 101.379 kg (223 lb 8 oz) (05/19 2106)  CBC:  Recent Labs Lab 04/12/16 1925  WBC 8.6  HGB 13.9  HCT 41.6  MCV 94.5  PLT [id number]    Basic Metabolic Panel:  Recent Labs Lab 04/12/16 1925  NA 140  K 4.0   CL 105  CO2 28  GLUCOSE 97  BUN 21*  CREATININE 0.77  CALCIUM 9.4    Lipid Panel:    Component Value Date/Time   CHOL 167 04/13/2016 0512   TRIG 172* 04/13/2016 0512   HDL 41 04/13/2016 0512   CHOLHDL 4.1 04/13/2016 0512   VLDL 34 04/13/2016 0512   LDLCALC 92 04/13/2016 0512   HgbA1c:  Lab Results  Component Value Date   HGBA1C 6.2* 04/13/2016   Urine Drug Screen: No results found for: LABOPIA, COCAINSCRNUR, LABBENZ, AMPHETMU, THCU, LABBARB    IMAGING I have personally reviewed the radiological images below and agree with the radiology interpretations.  Mr Virgel Paling (cerebral Arteries) 04/12/2016    MRI HEAD 1. 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 No associated hemorrhage or mass effect.  2. No other acute intracranial process identified.  3. Mild generalized age-related cerebral atrophy. Multifocal patchy T2/FLAIR foci involving the cerebral white matter of both cerebral hemispheres is somewhat nonspecific, but most likely related to moderate chronic small vessel ischemic disease.   MRA HEAD  1. No large or proximal arterial branch occlusion within the intracranial circulation. No high-grade or correctable stenosis.  2. Azygos ACA, which is largely supplied via the left internal carotid artery system. Right ICA slightly diminutive, likely related to hypoplastic right A1 segment.  3. Mild distal small vessel atheromatous irregularity within the MCA and PCA branches bilaterally.  Bilateral Carotid duplex 04/13/2016 Summary: - The vertebral arteries appear patent with antegrade flow. - Findings consistent with 1-39 percent stenosis involving the  right internal carotid artery and the left internal carotid  artery.  Transthoracic echocardiogram 04/13/2016 Study Conclusions - Left ventricle: The cavity size was normal. Systolic function was  normal. The estimated ejection fraction was in the range of 55%  to 60%. Wall  motion was normal; there were no regional wall  motion abnormalities. Doppler parameters are consistent with  abnormal left ventricular relaxation (grade 1 diastolic  dysfunction). - Mitral valve: Calcified annulus. Impressions: - Normal LV systolic function; grade 1 diastolic dysfunction.    PHYSICAL EXAM  Temp:  [97.8 F (36.6 C)-98.2 F (36.8 C)] 97.8 F (36.6 C) (05/20 1401) Pulse Rate:  [64-73] 64 (05/20 1401) Resp:  [15-18] 15 (05/20 1401) BP: (106-129)/(60-72) 109/72 mmHg (05/20 1401) SpO2:  [93 %-98 %] 98 % (05/20 1401) Weight:  [223 lb 8 oz (101.379 kg)] 223 lb 8 oz (101.379 kg) (05/19 2106)  General - Well nourished, well developed, in no apparent distress.  Ophthalmologic - Fundi not visualized due to eye movement.  Cardiovascular - Regular rate and rhythm.  Neck - occipital nerve tenderness on palpation bilaterally.  Mental Status -  Level of arousal and orientation to time, place, and person were intact. Language including expression, naming, repetition, comprehension was assessed and found intact. Fund of Knowledge was assessed and was intact.  Cranial Nerves II - XII - II - Visual field intact OU. III, IV, VI - Extraocular movements intact. V - Facial sensation intact bilaterally. VII - Facial movement intact bilaterally. VIII - Hearing & vestibular intact bilaterally. X - Palate elevates symmetrically. XI - Chin turning & shoulder shrug intact bilaterally. XII - Tongue protrusion intact.  Motor Strength - The patient's strength was normal in all extremities and pronator drift was absent.  Bulk was normal and fasciculations were absent.   Motor Tone - Muscle tone was assessed at the neck and appendages and was normal.  Reflexes - The patient's reflexes were 1+ in all extremities and she had no pathological reflexes.  Sensory - Light touch, temperature/pinprick were assessed and were symmetrical.    Coordination - The patient had normal movements  in the hands with no ataxia or dysmetria.  Tremor was absent.  Gait and Station - The patient's transfers, posture, gait, station, and turns were observed as normal.    ASSESSMENT/PLAN [name] is a 65 y.o. female with history of hyperlipidemia, remote history of migraines, history of palpitations, hypertension, and hypothyroidism presenting with a recent fall, vertigo, headache, and balance problems.  She did not receive IV t-PA due to late presentation and unknown time of onset.  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  Resultant  Occipital neuralgia  MRI - small acute cortical ischemic infarct involving the posterior right frontal lobe.  MRA - unremarkable  Carotid Doppler - no significant stenosis  2D Echo - unremarkable  Recommend 30 day cardiac event monitoring as outpt.  LDL - 92  HgbA1c - 6.2  VTE prophylaxis - Lovenox  Diet Heart Room service appropriate?: Yes; Fluid consistency:: Thin  No antithrombotic prior to admission, now on aspirin 325 mg daily. Continue ASA on discharge.  Patient counseled to be compliant with her antithrombotic medications  Ongoing aggressive stroke risk factor management  Therapy recommendations: - Pending  Disposition: Pending  Palpitations  Pt stated occasional heart racing bud does  admit that she has anxiety episode  Had previous one day holter monitoring which was negative  Will do 30 day cardiac event monitoring as outpt  Occipital neuralgia  Occipital nerve tenderness on palpitation bilaterally  Pt complains of occipital pain with radiation to vertex and behind of eye  Will do occipital nerve block  Follow up with her outpt pain management doctor   Hypertension  Stable  Permissive hypertension (OK if < 220/120) but gradually normalize in 5-7 days  Hyperlipidemia  Home meds:  Lipitor 40 mg daily resumed in hospital  LDL 92, goal < 70  Increase  Lipitor to 80 mg daily  Continue statin at discharge  Other Stroke Risk Factors  Advanced age  Obesity, Body mass index is 39.6 kg/(m^2).   Family hx stroke (father)  Migraines  Other Active Problems  Mildly elevated BUN - 21  Hospital day # 1  Neurology will sign off. Please call with questions. Pt will follow up with Dr. Erlinda Hong at [facility] in about 2 months. Thanks for the consult.  Rosalin Hawking, MD PhD Stroke Neurology 04/14/2016 4:04 PM    To contact Stroke Continuity provider, please refer to [email or web address] After hours, contact General Neurology

## 2016-04-14 NOTE — Progress Notes (Signed)
PT Cancellation Note  Patient Details Name: April Armstrong MRN: [id number] DOB: 28-Aug-1951   Cancelled Treatment:    Reason Eval/Treat Not Completed: Patient at procedure or test/unavailable.  Will return 04/15/16 to attempt PT evaluation.  Collie Siad PT, DPT  Pager: [phone] Phone: [phone] 04/14/2016, 2:35 PM

## 2016-04-14 NOTE — Progress Notes (Signed)
Pt discharged from hospital per orders from MD. Pt and spouse educated on discharge instructions. Pt and spouse verbalized understanding of instructions. All questions and concerns were addressed. Pt's IV was removed before discharge. Pt exited hospital via wheelchair. 

## 2016-04-14 NOTE — Progress Notes (Signed)
Pt arrived on unit 2050hrs, A&O, no obvious distress, denies any pain, numbness, or tingling, NIHSS-0, does states dizziness remains with movement. Oriented to unit and room equipment, admission notified of pt arrival.

## 2016-04-14 NOTE — Discharge Summary (Signed)
Physician Discharge Summary  April Armstrong [id number] DOB: [birth year] DOA: 04/12/2016  PCP: Donnie Coffin, MD  Admit date: 04/12/2016 Discharge date: 04/14/2016   Recommendations for Outpatient Follow-Up:   1. S/p occipital nerve block 2. Outpatient 30 day event monitor- will need to be arranged   Discharge Diagnosis:   Principal Problem:   Stroke [facility]) Active Problems:   Depression   Hypothyroidism   GERD (gastroesophageal reflux disease)   Hypercholesterolemia   Hypertension   IBS (irritable bowel syndrome)   Stroke (cerebrum) (HCC)   Acute CVA (cerebrovascular accident) ([place])   Bilateral occipital neuralgia   Discharge disposition:  Home  Discharge Condition: Improved.  Diet recommendation: Low sodium, heart healthy  Wound care: None.   History of Present Illness:   April Armstrong is a 65 y.o. female with medical history significant of hypertension, HLD, GERD, hypothyroidism, depression, IBS, who presents with poor balance and dizziness.  Patient reports that she has been having poor balance and dizziness for about 4-5 days. She had fall on Sunday, without any injury. Patient does not have unilateral weakness, numbness or tingling in her extremities. No slurred speech, vision change or hearing loss. She reports headache over occipital area, which is moderate, constant and nonradiating. No neck stiffness. Patient does not have fever, chills, chest pain, shortness of breath, cough, nausea, vomiting, symptoms of UTI. Patient states that she has chronic diarrhea due to IBS, which has not changed recently.  ED Course: pt was found to have WBC 8.6, temperature normal, no tachycardia, electrolytes and renal function okay. Pt is placed on tele bed for obs. Neurology was consulted.  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 No associated hemorrhage or mass effect; mild generalized age-related cerebral atrophy;  chronic small vessel ischemic disease. MRA HEAD showed no large or proximal arterial branch occlusion within the intracranial circulation. No high-grade or correctable stenosis. Azygos ACA, which is largely supplied via the left internal carotid artery system. Right ICA slightly diminutive, likely related to hypoplastic right A1 segment. Mild distal small vessel atheromatous irregularity within the MCA and PCA branches bilaterally.   Hospital Course by Problem:   Stroke: Right frontal cortical punctate infarct, possibly embolic with an unknown etiology.  Resultant Occipital neuralgia  MRI - small acute cortical ischemic infarct involving the posterior right frontal lobe.  MRA - unremarkable  Carotid Doppler - no significant stenosis  2D Echo - unremarkable  Recommend 30 day cardiac event monitoring as outpt.  LDL - 92  HgbA1c - 6.2  No antithrombotic prior to admission, now on aspirin 325 mg daily. Continue ASA on discharge.   Palpitations  Pt stated occasional heart racing bud does admit that she has anxiety episode  Had previous one day holter monitoring which was negative  Will do 30 day cardiac event monitoring as outpt  B/l Occipital neuralgia  Occipital nerve tenderness on palpitation bilaterally  Pt complains of occipital pain with radiation to vertex and behind of eye  S/p occipital nerve block  Follow up with her outpt pain management doctor -immediate pain and symptom relief  Hypertension Permissive hypertension (OK if < 220/120) but gradually normalize in 5-7 days  Hyperlipidemia  Home meds: Lipitor 40 mg daily resumed in hospital  LDL 92, goal < 70  Increase Lipitor to 80 mg daily  Continue statin at discharge    Medical Consultants:    Neuro   Discharge Exam:   Filed Vitals:   04/14/16 0925 04/14/16 1401  BP: 106/63 109/72  Pulse: 70 64  Temp: 98 F (36.7 C) 97.8 F (36.6 C)  Resp: 16 15   Filed Vitals:   04/14/16 0110  04/14/16 0508 04/14/16 0925 04/14/16 1401  BP: 114/62 129/63 106/63 109/72  Pulse: 73 69 70 64  Temp: 98.1 F (36.7 C) 98 F (36.7 C) 98 F (36.7 C) 97.8 F (36.6 C)  TempSrc: Oral Oral Oral Oral  Resp: 18 16 16 15   Height:      Weight:      SpO2: 93% 96% 93% 98%    Gen:  NAD   The results of significant diagnostics from this hospitalization (including imaging, microbiology, ancillary and laboratory) are listed below for reference.     Procedures and Diagnostic Studies:   Mr Brain Wo Contrast (neuro Protocol)  04/12/2016  CLINICAL DATA:  Initial evaluation for acute vertigo. EXAM: MRI HEAD WITHOUT CONTRAST MRA HEAD WITHOUT CONTRAST TECHNIQUE: Multiplanar, multiecho pulse sequences of the brain and surrounding structures were obtained without intravenous contrast. Angiographic images of the head were obtained using MRA technique without contrast. COMPARISON:  None available. FINDINGS: MRI HEAD FINDINGS Diffuse prominence of the CSF containing spaces is compatible with generalized age-related cerebral atrophy. Patchy T2/FLAIR hyperintensity present within the periventricular, deep, and subcortical white matter of both cerebral hemispheres, nonspecific, but most likely related to moderate chronic small vessel ischemic disease. Small abnormal restricted diffusion involving the cortical gray matter of the posterior right frontal lobe near the vertex (series 5, image 41). Finding consistent with small acute ischemic infarct. Infarct measures approximately 14 mm in length. This closely approximates the precentral gyrus. No associated hemorrhage or mass effect. No other acute infarct identified. Gray-white matter differentiation otherwise maintained. Major intracranial vascular flow voids are preserved. No acute or chronic intracranial hemorrhage. No areas of chronic infarction. No mass lesion, midline shift, or mass effect. No hydrocephalus. No extra-axial fluid collection. Major dural sinuses are  grossly patent. Craniocervical junction within normal limits. Mild degenerative spondylolysis noted within the upper cervical spine without significant stenosis. Pituitary gland normal.  No acute abnormality about the orbits. Mild mucosal thickening within the ethmoidal air cells. Paranasal sinuses are otherwise clear. No mastoid effusion. Inner ear structures grossly normal. Bone marrow signal intensity within normal limits. No scalp soft tissue abnormality. MRA HEAD FINDINGS ANTERIOR CIRCULATION: Visualized distal cervical segments of the internal carotid arteries are patent with antegrade flow. Right ICA somewhat diminutive as compared to the left. Petrous, cavernous, and supraclinoid segments widely patent. Right A1 segment hypoplastic but patent, likely accounting for the slightly diminutive right ICA. Dominant left A1 segment widely patent. Anterior communicating artery within normal limits. There appears to be an azygos ACA which is widely patent to its distal left. M1 segments patent without stenosis or occlusion. MCA bifurcations normal. No proximal M2 branch occlusion or stenosis. Distal MCA branches are symmetric but demonstrate multifocal atheromatous irregularity. Vertebral arteries patent to the vertebrobasilar junction. Posterior inferior cerebral arteries not well evaluated on this exam. Basilar artery widely patent to its distal aspect. Superior cerebral arteries patent bilaterally. Both of the posterior cerebral arteries arise from the basilar artery and are well opacified to their distal aspects. Mild distal atheromatous irregularity within the PCA branches bilaterally. No aneurysm or vascular malformation. IMPRESSION: MRI HEAD IMPRESSION: 1. 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 No associated hemorrhage or mass effect. 2. No other acute intracranial process identified. 3. Mild generalized age-related cerebral atrophy. Multifocal patchy T2/FLAIR foci  involving the cerebral white matter of both cerebral hemispheres is somewhat nonspecific, but most likely related to moderate chronic small vessel ischemic disease. MRA HEAD IMPRESSION: 1. No large or proximal arterial branch occlusion within the intracranial circulation. No high-grade or correctable stenosis. 2. Azygos ACA, which is largely supplied via the left internal carotid artery system. Right ICA slightly diminutive, likely related to hypoplastic right A1 segment. 3. Mild distal small vessel atheromatous irregularity within the MCA and PCA branches bilaterally. Electronically Signed   By: Jeannine Boga M.D.   On: 04/12/2016 22:55   Mr Virgel Paling (cerebral Arteries)  04/12/2016  CLINICAL DATA:  Initial evaluation for acute vertigo. EXAM: MRI HEAD WITHOUT CONTRAST MRA HEAD WITHOUT CONTRAST TECHNIQUE: Multiplanar, multiecho pulse sequences of the brain and surrounding structures were obtained without intravenous contrast. Angiographic images of the head were obtained using MRA technique without contrast. COMPARISON:  None available. FINDINGS: MRI HEAD FINDINGS Diffuse prominence of the CSF containing spaces is compatible with generalized age-related cerebral atrophy. Patchy T2/FLAIR hyperintensity present within the periventricular, deep, and subcortical white matter of both cerebral hemispheres, nonspecific, but most likely related to moderate chronic small vessel ischemic disease. Small abnormal restricted diffusion involving the cortical gray matter of the posterior right frontal lobe near the vertex (series 5, image 41). Finding consistent with small acute ischemic infarct. Infarct measures approximately 14 mm in length. This closely approximates the precentral gyrus. No associated hemorrhage or mass effect. No other acute infarct identified. Gray-white matter differentiation otherwise maintained. Major intracranial vascular flow voids are preserved. No acute or chronic intracranial hemorrhage. No  areas of chronic infarction. No mass lesion, midline shift, or mass effect. No hydrocephalus. No extra-axial fluid collection. Major dural sinuses are grossly patent. Craniocervical junction within normal limits. Mild degenerative spondylolysis noted within the upper cervical spine without significant stenosis. Pituitary gland normal.  No acute abnormality about the orbits. Mild mucosal thickening within the ethmoidal air cells. Paranasal sinuses are otherwise clear. No mastoid effusion. Inner ear structures grossly normal. Bone marrow signal intensity within normal limits. No scalp soft tissue abnormality. MRA HEAD FINDINGS ANTERIOR CIRCULATION: Visualized distal cervical segments of the internal carotid arteries are patent with antegrade flow. Right ICA somewhat diminutive as compared to the left. Petrous, cavernous, and supraclinoid segments widely patent. Right A1 segment hypoplastic but patent, likely accounting for the slightly diminutive right ICA. Dominant left A1 segment widely patent. Anterior communicating artery within normal limits. There appears to be an azygos ACA which is widely patent to its distal left. M1 segments patent without stenosis or occlusion. MCA bifurcations normal. No proximal M2 branch occlusion or stenosis. Distal MCA branches are symmetric but demonstrate multifocal atheromatous irregularity. Vertebral arteries patent to the vertebrobasilar junction. Posterior inferior cerebral arteries not well evaluated on this exam. Basilar artery widely patent to its distal aspect. Superior cerebral arteries patent bilaterally. Both of the posterior cerebral arteries arise from the basilar artery and are well opacified to their distal aspects. Mild distal atheromatous irregularity within the PCA branches bilaterally. No aneurysm or vascular malformation. IMPRESSION: MRI HEAD IMPRESSION: 1. 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 No associated hemorrhage  or mass effect. 2. No other acute intracranial process identified. 3. Mild generalized age-related cerebral atrophy. Multifocal patchy T2/FLAIR foci involving the cerebral white matter of both cerebral hemispheres is somewhat nonspecific, but most likely related to moderate chronic small vessel ischemic disease. MRA HEAD IMPRESSION: 1. No large or proximal arterial branch occlusion within the  intracranial circulation. No high-grade or correctable stenosis. 2. Azygos ACA, which is largely supplied via the left internal carotid artery system. Right ICA slightly diminutive, likely related to hypoplastic right A1 segment. 3. Mild distal small vessel atheromatous irregularity within the MCA and PCA branches bilaterally. Electronically Signed   By: Jeannine Boga M.D.   On: 04/12/2016 22:55     Labs:   Basic Metabolic Panel:  Recent Labs Lab 04/12/16 1925  NA 140  K 4.0  CL 105  CO2 28  GLUCOSE 97  BUN 21*  CREATININE 0.77  CALCIUM 9.4   GFR Estimated Creatinine Clearance: 78.6 mL/min (by C-G formula based on Cr of 0.77). Liver Function Tests: No results for input(s): AST, ALT, ALKPHOS, BILITOT, PROT, ALBUMIN in the last 168 hours. No results for input(s): LIPASE, AMYLASE in the last 168 hours. No results for input(s): AMMONIA in the last 168 hours. Coagulation profile  Recent Labs Lab 04/12/16 1925  INR 1.06    CBC:  Recent Labs Lab 04/12/16 1925  WBC 8.6  HGB 13.9  HCT 41.6  MCV 94.5  PLT 236   Cardiac Enzymes: No results for input(s): CKTOTAL, CKMB, CKMBINDEX, TROPONINI in the last 168 hours. BNP: Invalid input(s): POCBNP CBG:  Recent Labs Lab 04/12/16 1912  GLUCAP 87   D-Dimer No results for input(s): DDIMER in the last 72 hours. Hgb A1c  Recent Labs  04/13/16 0512  HGBA1C 6.2*   Lipid Profile  Recent Labs  04/13/16 0512  CHOL 167  HDL 41  LDLCALC 92  TRIG 172*  CHOLHDL 4.1   Thyroid function studies  Recent Labs  04/13/16 0512  TSH  3.067   Anemia work up  Recent Labs  04/14/16 1407  VITAMINB12 1137*   Microbiology No results found for this or any previous visit (from the past 240 hour(s)).   Discharge Instructions:   Discharge Instructions    Ambulatory referral to Neurology    Complete by:  As directed   Pt will follow up with Dr. Erlinda Hong at [facility] in about 2 months. Thanks.     Diet - low sodium heart healthy    Complete by:  As directed      Discharge instructions    Complete by:  As directed     30 day cardiac event monitoring as outpt Valium/antivert was sent to your pharmacy-- do not fill these     Increase activity slowly    Complete by:  As directed             Medication List    STOP taking these medications        lisinopril-hydrochlorothiazide 20-12.5 MG tablet  Commonly known as:  PRINZIDE,ZESTORETIC      TAKE these medications        aspirin 325 MG tablet  Take 1 tablet (325 mg total) by mouth daily.     atorvastatin 80 MG tablet  Commonly known as:  LIPITOR  Take 1 tablet (80 mg total) by mouth at bedtime.     buPROPion 300 MG 24 hr tablet  Commonly known as:  WELLBUTRIN XL  Take 300 mg by mouth daily.     ibuprofen 200 MG tablet  Commonly known as:  ADVIL,MOTRIN  Take 800 mg by mouth every 6 (six) hours as needed for moderate pain.     lamoTRIgine 100 MG tablet  Commonly known as:  LAMICTAL  Take 100 mg by mouth daily.     levothyroxine 100 MCG tablet  Commonly known as:  SYNTHROID, LEVOTHROID  Take 100 mcg by mouth daily before breakfast.     loratadine 10 MG tablet  Commonly known as:  CLARITIN  Take 10 mg by mouth daily as needed for allergies (pain).     LORazepam 0.5 MG tablet  Commonly known as:  ATIVAN  Take 0.5 mg by mouth every 6 (six) hours as needed for anxiety.     meloxicam 15 MG tablet  Commonly known as:  MOBIC  Take 15 mg by mouth daily.     metoprolol succinate 25 MG 24 hr tablet  Commonly known as:  TOPROL-XL  Take 25 mg by mouth daily.       omeprazole 40 MG capsule  Commonly known as:  PRILOSEC  Take 40 mg by mouth daily.     prenatal multivitamin Tabs tablet  Take 1 tablet by mouth daily at 12 noon.     RESTASIS 0.05 % ophthalmic emulsion  Generic drug:  cycloSPORINE  Place 1 drop into both eyes 2 (two) times daily.     sertraline 100 MG tablet  Commonly known as:  ZOLOFT  Take 100 mg by mouth daily.     spironolactone 25 MG tablet  Commonly known as:  ALDACTONE  Take 25 mg by mouth daily.     traZODone 150 MG tablet  Commonly known as:  DESYREL  Take 75 mg by mouth at bedtime.     VIBERZI PO  Take 75-150 mg by mouth daily. Pt takes 1 or 2 tablets depending on IBS condition.     vitamin B-12 100 MCG tablet  Commonly known as:  CYANOCOBALAMIN  Take 100 mcg by mouth at bedtime.           Follow-up Information    Follow up with Xu,Jindong, MD. Schedule an appointment as soon as possible for a visit in 2 months.   Specialty:  Neurology   Why:  Call tomorrow to schedule recheck next week., stroke clinic   Contact information:   [address] [place] [address] [phone]       Follow up with Donnie Coffin, MD In 1 week.   Specialty:  Family Medicine   Contact information:   [address] [place] [address] [phone]        Time coordinating discharge: 35 min  Signed:  Vicktoria Armstrong Alison Stalling   [facility] 04/14/2016, 5:10 PM

## 2016-04-14 NOTE — Evaluation (Signed)
Occupational Therapy Evaluation and Discharge Patient Details Name: April Armstrong MRN: [id number] DOB: 10-Sep-1951 Today's Date: 04/14/2016    History of Present Illness 65 y.o. female with medical history significant of hypertension, HLD, GERD, hypothyroidism, depression, IBS, who presents with poor balance and dizziness. MRI on 5/18 + for Small acute cortical ischemic infarct involving the posterior.   Clinical Impression   Pt reports she was independent with ADLs and mobility PTA. Currently pt has returned to baseline level of function and is independent with ADLs and functional mobility. Performed higher level balance activities with pt; no dizziness, unsteadiness, or LOB noted. Pt planning to d/c home with 24/7 supervision from her husband. No further acute OT needs identified; signing off at this time. Please re-consult if needs change. Thank you for this referral.    Follow Up Recommendations  No OT follow up;Supervision - Intermittent    Equipment Recommendations  None recommended by OT    Recommendations for Other Services       Precautions / Restrictions Precautions Precautions: Fall Restrictions Weight Bearing Restrictions: No      Mobility Bed Mobility Overal bed mobility: Independent                Transfers Overall transfer level: Independent Equipment used: None                  Balance Overall balance assessment: No apparent balance deficits (not formally assessed)                                          ADL Overall ADL's : Independent;At baseline                                       General ADL Comments: Pt able to demo independence with functional mobility and ADL activities; performed tub transfer with mod I. Performed part of the DGI and higher level balance activities; pt with no dizziness, unsteadiness, or LOB throughout all activities. Reports she feels she has returned to baseline.     Vision  Vision Assessment?: No apparent visual deficits   Perception     Praxis      Pertinent Vitals/Pain Pain Assessment: No/denies pain     Hand Dominance Right   Extremity/Trunk Assessment Upper Extremity Assessment Upper Extremity Assessment: Overall WFL for tasks assessed   Lower Extremity Assessment Lower Extremity Assessment: Overall WFL for tasks assessed   Cervical / Trunk Assessment Cervical / Trunk Assessment: Normal   Communication Communication Communication: No difficulties   Cognition Arousal/Alertness: Awake/alert Behavior During Therapy: WFL for tasks assessed/performed Overall Cognitive Status: Within Functional Limits for tasks assessed                     General Comments       Exercises       Shoulder Instructions      Home Living Family/patient expects to be discharged to:: Private residence Living Arrangements: Spouse/significant other Available Help at Discharge: Family;Available 24 hours/day Type of Home: House Home Access: Stairs to enter [employer] of Steps: 2   Home Layout: Two level;Able to live on main level with bedroom/bathroom     Bathroom Shower/Tub: Tub/shower unit Shower/tub characteristics: Architectural technologist: Standard     Home Equipment: Civil engineer, contracting  Prior Functioning/Environment Level of Independence: Independent             OT Diagnosis: Other (comment) (dizziness)   OT Problem List:     OT Treatment/Interventions:      OT Goals(Current goals can be found in the care plan section) Acute Rehab OT Goals Patient Stated Goal: get back out on her boat OT Goal Formulation: All assessment and education complete, DC therapy  OT Frequency:     Barriers to D/C:            Co-evaluation              End of Session Equipment Utilized During Treatment: Gait belt  Activity Tolerance: Patient tolerated treatment well Patient left: in bed;with call bell/phone within  reach;with family/visitor present;with nursing/sitter in room   Time: [id number] OT Time Calculation (min): 16 min Charges:  OT General Charges $OT Visit: 1 Procedure OT Evaluation $OT Eval Low Complexity: 1 Procedure G-Codes:     Binnie Kand M.S., OTR/L Pager: [phone]  04/14/2016, 5:30 PM

## 2016-04-14 NOTE — Evaluation (Signed)
Speech Language Pathology Evaluation Patient Details Name: April Armstrong MRN: [id number] DOB: 18-Oct-1951 Today's Date: 04/14/2016 Time: [id number] SLP Time Calculation (min) (ACUTE ONLY): 11 min  Problem List:  Patient Active Problem List   Diagnosis Date Noted  . Stroke (cerebrum) ([place]) 04/13/2016  . Acute CVA (cerebrovascular accident) ([place]) 04/13/2016  . Cerebral infarction due to unspecified mechanism   . Essential hypertension   . Thyroid activity decreased   . Stroke ([place]) 04/12/2016  . Depression   . Hypothyroidism   . GERD (gastroesophageal reflux disease)   . Hypercholesterolemia   . Hypertension   . IBS (irritable bowel syndrome)    Past Medical History:  Past Medical History  Diagnosis Date  . Thyroid disease   . GERD (gastroesophageal reflux disease)   . Hypercholesterolemia   . Hypertension   . Depression   . Hypothyroidism   . IBS (irritable bowel syndrome)    Past Surgical History:  Past Surgical History  Procedure Laterality Date  . Trigger finger release    . Cesarean section    . Abdominal hysterectomy    . Cholecystectomy    . Thyroidectomy    . Appendectomy     HPI:  Pt is a 65 y.o. female with medical history significant of hypertension, HLD, GERD, hypothyroidism, depression, IBS, who presents with poor balance and dizziness. MRI of head signficant for small acute cortical ischemic infarct involving the posterior right frontal lobe   Assessment / Plan / Recommendation Clinical Impression  Pts cogntive linguistic abilities appearing within functional limits. Expressive and receptive language abilities also appear intact. Pt and spouse deny any difficutly from baseline functioning. No ST needs identified. ST to sign off.      SLP Assessment  Patient does not need any further Speech Lanaguage Pathology Services    Follow Up Recommendations  None    Frequency and Duration           SLP Evaluation Prior Functioning  Cognitive/Linguistic  Baseline: Within functional limits Type of Home: House  Lives With: Spouse Available Help at Discharge: Family;Available 24 hours/day Education: some college Vocation: Retired   Clinical biochemist Status: Within Advertising copywriter for tasks assessed Arousal/Alertness: Awake/alert Orientation Level: Oriented X4 Memory: Appears intact (pt reports at baseline having mild STM deficits ) Awareness: Appears intact Problem Solving: Appears intact Safety/Judgment: Appears intact    Comprehension  Auditory Comprehension Overall Auditory Comprehension: Appears within functional limits for tasks assessed    Expression Expression Primary Mode of Expression: Verbal Verbal Expression Overall Verbal Expression: Appears within functional limits for tasks assessed Written Expression Dominant Hand: Right   Oral / Motor  Oral Motor/Sensory Function Overall Oral Motor/Sensory Function: Within functional limits Motor Speech Overall Motor Speech: Appears within functional limits for tasks assessed   GO                   Arvil Chaco MA, [facility] Pathologist    Levi Aland 04/14/2016, 11:29 AM

## 2016-04-14 NOTE — Progress Notes (Signed)
OT Cancellation Note  Patient Details Name: April Armstrong MRN: [id number] DOB: 09-16-1951   Cancelled Treatment:    Reason Eval/Treat Not Completed: Patient at procedure or test/ unavailable. Pt currently off the floor for test. Will follow up for OT eval as time allows.  Binnie Kand M.S., OTR/L Pager: [phone]  04/14/2016, 2:35 PM

## 2016-04-16 ENCOUNTER — Telehealth: Payer: Self-pay | Admitting: *Deleted

## 2016-04-16 NOTE — Telephone Encounter (Signed)
------------------------------------------------------------   Otho Najjar [facility]             CID [id number]  Patient April Armstrong                 Pt's Dr Reola Calkins PT       Area Code 336 Phone# [phone] AN APPT W/OFFICE WITHIN A    WEEK FOR A FOLLOW UP AFTER HAVING A STROKE           Disp:Y/N N If Y = C/B If No Response In 26minutes ============================================================

## 2016-04-18 NOTE — Telephone Encounter (Signed)
Rn call patient about her appt in 2 months. Pt stated she did schedule a 2 month follow up with Dr.Xu. Also her discharge stated to have a recheck in two weeks. Rn stated a message will be sent to Dr. Erlinda Hong about if its a clerical error. Pt verbalized understanding.

## 2016-04-18 NOTE — Telephone Encounter (Signed)
Pt called to scheduled appt but she brought to my attention that it sts to f.u in 26mth but to call clinic to f/u for recheck next week. RN please clarify. Thanks!!

## 2016-04-18 NOTE — Telephone Encounter (Signed)
Appointment in 2 months. Please let the pt know. Thanks.  Rosalin Hawking, MD PhD Stroke Neurology 04/18/2016 10:47 PM

## 2016-04-19 NOTE — Telephone Encounter (Signed)
Rn call patient back to let her know that her 2 month appt is fine. Rn stated she does not have to be recheck in 2 weeks at [facility]. Pt verbalized understanding.

## 2016-04-20 DIAGNOSIS — M545 Low back pain: Secondary | ICD-10-CM | POA: Diagnosis not present

## 2016-04-20 DIAGNOSIS — R000 Tachycardia, unspecified: Secondary | ICD-10-CM | POA: Diagnosis not present

## 2016-04-20 DIAGNOSIS — F339 Major depressive disorder, recurrent, unspecified: Secondary | ICD-10-CM | POA: Diagnosis not present

## 2016-04-20 DIAGNOSIS — G4700 Insomnia, unspecified: Secondary | ICD-10-CM | POA: Diagnosis not present

## 2016-04-20 DIAGNOSIS — I10 Essential (primary) hypertension: Secondary | ICD-10-CM | POA: Diagnosis not present

## 2016-04-20 DIAGNOSIS — K589 Irritable bowel syndrome without diarrhea: Secondary | ICD-10-CM | POA: Diagnosis not present

## 2016-04-20 DIAGNOSIS — I639 Cerebral infarction, unspecified: Secondary | ICD-10-CM | POA: Diagnosis not present

## 2016-04-25 DIAGNOSIS — I6789 Other cerebrovascular disease: Secondary | ICD-10-CM | POA: Diagnosis not present

## 2016-04-25 DIAGNOSIS — R1084 Generalized abdominal pain: Secondary | ICD-10-CM | POA: Diagnosis not present

## 2016-04-25 DIAGNOSIS — K580 Irritable bowel syndrome with diarrhea: Secondary | ICD-10-CM | POA: Diagnosis not present

## 2016-04-25 DIAGNOSIS — R000 Tachycardia, unspecified: Secondary | ICD-10-CM | POA: Insufficient documentation

## 2016-04-26 ENCOUNTER — Ambulatory Visit (INDEPENDENT_AMBULATORY_CARE_PROVIDER_SITE_OTHER): Payer: Medicare Other

## 2016-04-26 DIAGNOSIS — R000 Tachycardia, unspecified: Secondary | ICD-10-CM

## 2016-05-03 DIAGNOSIS — M545 Low back pain: Secondary | ICD-10-CM | POA: Diagnosis not present

## 2016-05-03 DIAGNOSIS — M47816 Spondylosis without myelopathy or radiculopathy, lumbar region: Secondary | ICD-10-CM | POA: Diagnosis not present

## 2016-05-17 DIAGNOSIS — M47816 Spondylosis without myelopathy or radiculopathy, lumbar region: Secondary | ICD-10-CM | POA: Diagnosis not present

## 2016-06-13 DIAGNOSIS — M542 Cervicalgia: Secondary | ICD-10-CM | POA: Diagnosis not present

## 2016-06-13 DIAGNOSIS — M5481 Occipital neuralgia: Secondary | ICD-10-CM | POA: Diagnosis not present

## 2016-06-13 DIAGNOSIS — M47812 Spondylosis without myelopathy or radiculopathy, cervical region: Secondary | ICD-10-CM | POA: Diagnosis not present

## 2016-06-13 DIAGNOSIS — M47816 Spondylosis without myelopathy or radiculopathy, lumbar region: Secondary | ICD-10-CM | POA: Diagnosis not present

## 2016-06-21 ENCOUNTER — Ambulatory Visit (INDEPENDENT_AMBULATORY_CARE_PROVIDER_SITE_OTHER): Payer: Medicare Other | Admitting: Neurology

## 2016-06-21 ENCOUNTER — Encounter: Payer: Self-pay | Admitting: Neurology

## 2016-06-21 VITALS — BP 103/72 | HR 69 | Ht 63.0 in | Wt 229.0 lb

## 2016-06-21 DIAGNOSIS — M5481 Occipital neuralgia: Secondary | ICD-10-CM | POA: Diagnosis not present

## 2016-06-21 DIAGNOSIS — I10 Essential (primary) hypertension: Secondary | ICD-10-CM | POA: Diagnosis not present

## 2016-06-21 DIAGNOSIS — F411 Generalized anxiety disorder: Secondary | ICD-10-CM

## 2016-06-21 DIAGNOSIS — I639 Cerebral infarction, unspecified: Secondary | ICD-10-CM

## 2016-06-21 DIAGNOSIS — E785 Hyperlipidemia, unspecified: Secondary | ICD-10-CM

## 2016-06-21 DIAGNOSIS — I63311 Cerebral infarction due to thrombosis of right middle cerebral artery: Secondary | ICD-10-CM | POA: Diagnosis not present

## 2016-06-21 NOTE — Patient Instructions (Signed)
-   continue ASA and lipitor for stroke prevention - check BP at home and record - Follow up with your primary care physician for stroke risk factor modification. Recommend maintain blood pressure goal <130/80, diabetes with hemoglobin A1c goal below 7.0% and lipids with LDL cholesterol goal below 70 mg/dL.  - relaxation and cope with stress - consider psychology counseling - follow up in 4 months

## 2016-06-22 DIAGNOSIS — F411 Generalized anxiety disorder: Secondary | ICD-10-CM | POA: Insufficient documentation

## 2016-06-22 NOTE — Progress Notes (Signed)
STROKE NEUROLOGY FOLLOW UP NOTE  NAME: April Armstrong DOB: [birth year]  REASON FOR VISIT: stroke follow up HISTORY FROM: pt and chart  Today we had the pleasure of seeing April Armstrong in follow-up at our Neurology Clinic. Pt was accompanied by no one.   History Summary [name] is a 65 y.o. female with history of hyperlipidemia, remote history of migraines, palpitations, hypertension, and hypothyroidism admitted on 04/12/16 after a recent fall, vertigo, headache, and balance problems.  MRI showed small acute infarct involving the posterior right frontal lobe. MRA, carotid Doppler, TTE all negative. LDL 92 and A1c 6.2.  She does have history of palpitations and recommend 30 day cardiac event monitor as outpatient.  She had occipital neuralgia and received occipital nerve block bilaterally.  She was discharged with aspirin and Lipitor.  Interval History During the interval time, the patient has been doing well. No recurrent strokelike symptoms. Occipital neurology has not flared up yet.  30 day monitoring negative for A. Fib. On aspirin and Lipitor, without complaints.  Has frequent anxiety spells, on Wellbutrin, Lamictal, Ativan,  Zoloft and trazodone. BP 103/72.  REVIEW OF SYSTEMS: Full 14 system review of systems performed and notable only for those listed below and in HPI above, all others are negative:  Constitutional:   Fatigue, unexpected wt changes, excessive sweating Cardiovascular:  Leg swelling Ear/Nose/Throat:   Skin:  Eyes:   Blurry vision Respiratory:   Gastroitestinal:   Genitourinary:  Hematology/Lymphatic:   Bleeding easily Endocrine:  Excessive thirst Musculoskeletal:   Allergy/Immunology:   Neurological:   Speech difficulty Psychiatric:  Depression, nervous, anxious Sleep:  Daytime sleepiness, snoring  The following represents the patient's updated allergies and side effects list: No Known Allergies  The neurologically relevant items on the patient's  problem list were reviewed on today's visit.  Neurologic Examination  A problem focused neurological exam (12 or more points of the single system neurologic examination, vital signs counts as 1 point, cranial nerves count for 8 points) was performed.  Blood pressure 103/72, pulse 69, height 5\' 3"  (1.6 m), weight 229 lb (103.9 kg).  General - Well nourished, well developed, in no apparent distress.  Ophthalmologic - Sharp disc margins OU.   Cardiovascular - Regular rate and rhythm with no murmur.  Mental Status -  Level of arousal and orientation to time, place, and person were intact. Language including expression, naming, repetition, comprehension was assessed and found intact. Attention span and concentration were normal. Recent and remote memory were intact. Fund of Knowledge was assessed and was intact.  Cranial Nerves II - XII - II - Visual field intact OU. III, IV, VI - Extraocular movements intact. V - Facial sensation intact bilaterally. VII - Facial movement intact bilaterally. VIII - Hearing & vestibular intact bilaterally. X - Palate elevates symmetrically. XI - Chin turning & shoulder shrug intact bilaterally. XII - Tongue protrusion intact.  Motor Strength - The patient's strength was normal in all extremities and pronator drift was absent.  Bulk was normal and fasciculations were absent.   Motor Tone - Muscle tone was assessed at the neck and appendages and was normal.  Reflexes - The patient's reflexes were 1+ in all extremities and she had no pathological reflexes.  Sensory - Light touch, temperature/pinprick, vibration and proprioception, and Romberg testing were assessed and were normal.    Coordination - The patient had normal movements in the hands and feet with no ataxia or dysmetria.  Tremor was absent.  Gait and Station -  The patient's transfers, posture, gait, station, and turns were observed as normal.   Functional score  mRS = 0   0 - No  symptoms.   1 - No significant disability. Able to carry out all usual activities, despite some symptoms.   2 - Slight disability. Able to look after own affairs without assistance, but unable to carry out all previous activities.   3 - Moderate disability. Requires some help, but able to walk unassisted.   4 - Moderately severe disability. Unable to attend to own bodily needs without assistance, and unable to walk unassisted.   5 - Severe disability. Requires constant nursing care and attention, bedridden, incontinent.   6 - Dead.   NIH Stroke Scale = 0   Data reviewed: I personally reviewed the images and agree with the radiology interpretations.  Mri and Mra Head 04/12/2016    MRI HEAD 1. 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 No associated hemorrhage or mass effect.  2. No other acute intracranial process identified.  3. Mild generalized age-related cerebral atrophy. Multifocal patchy T2/FLAIR foci involving the cerebral white matter of both cerebral hemispheres is somewhat nonspecific, but most likely related to moderate chronic small vessel ischemic disease.   MRA HEAD  1. No large or proximal arterial branch occlusion within the intracranial circulation. No high-grade or correctable stenosis.  2. Azygos ACA, which is largely supplied via the left internal carotid artery system. Right ICA slightly diminutive, likely related to hypoplastic right A1 segment.  3. Mild distal small vessel atheromatous irregularity within the MCA and PCA branches bilaterally.   Bilateral Carotid duplex 04/13/2016 Summary: - The vertebral arteries appear patent with antegrade flow. - Findings consistent with 1-39 percent stenosis involving the  right internal carotid artery and the left internal carotid  artery.  Transthoracic echocardiogram 04/13/2016 Study Conclusions - Left ventricle: The cavity size was normal. Systolic function was   normal. The estimated ejection fraction was in the range of 55%  to 60%. Wall motion was normal; there were no regional wall  motion abnormalities. Doppler parameters are consistent with  abnormal left ventricular relaxation (grade 1 diastolic  dysfunction). - Mitral valve: Calcified annulus. Impressions: - Normal LV systolic function; grade 1 diastolic dysfunction.   30 day cardiac event monitoring -  No A. fib  Component     Latest Ref Rng & Units 04/13/2016 04/14/2016  Cholesterol     0 - 200 mg/dL 167   Triglycerides     <150 mg/dL 172 (H)   HDL Cholesterol     >40 mg/dL 41   Total CHOL/HDL Ratio     RATIO 4.1   VLDL     0 - 40 mg/dL 34   LDL (calc)     0 - 99 mg/dL 92   Hemoglobin A1C     4.8 - 5.6 % 6.2 (H)   Mean Plasma Glucose     mg/dL 131   TSH     0.350 - 4.500 uIU/mL 3.067   Vitamin B12     180 - 914 pg/mL  1,137 (H)    Assessment: As you may recall, she is a 65 y.o. Caucasian female with PMH of hyperlipidemia, remote history of migraines, palpitations, hypertension, and hypothyroidism admitted on 04/12/16 for a small acute infarct involving the posterior right frontal lobe. MRA, carotid Doppler, TTE all negative. LDL 92 and A1c 6.2.  She does have history of palpitations and recommend 30 day cardiac event monitor  as outpatient.  She had occipital neuralgia and received occipital nerve block bilaterally.  She was discharged with aspirin and Lipitor. During the interval time, the patient has been doing well.  30 today cardiac event monitoring no A. Fib. Occipital neurology has not flared up yet.  On aspirin and Lipitor, without complaints. Has frequent anxiety spells, on Wellbutrin, Lamictal, Ativan,  Zoloft and trazodone.   Plan:  - continue ASA and lipitor for stroke prevention - check BP at home and record - Follow up with your primary care physician for stroke risk factor modification. Recommend maintain blood pressure goal <130/80, diabetes with hemoglobin  A1c goal below 7.0% and lipids with LDL cholesterol goal below 70 mg/dL.  - relaxation and cope with stress - consider psychology counseling - follow up in 4 months   I spent more than 25 minutes of face to face time with the patient. Greater than 50% of time was spent in counseling and coordination of care.    No orders of the defined types were placed in this encounter.   No orders of the defined types were placed in this encounter.   Patient Instructions  - continue ASA and lipitor for stroke prevention - check BP at home and record - Follow up with your primary care physician for stroke risk factor modification. Recommend maintain blood pressure goal <130/80, diabetes with hemoglobin A1c goal below 7.0% and lipids with LDL cholesterol goal below 70 mg/dL.  - relaxation and cope with stress - consider psychology counseling - follow up in 4 months    Rosalin Hawking, MD PhD [facility] [address], [place], [place] [address] [phone]

## 2016-07-03 ENCOUNTER — Encounter: Payer: Self-pay | Admitting: Interventional Cardiology

## 2016-07-03 ENCOUNTER — Ambulatory Visit (INDEPENDENT_AMBULATORY_CARE_PROVIDER_SITE_OTHER): Payer: Medicare Other | Admitting: Interventional Cardiology

## 2016-07-03 VITALS — BP 102/66 | HR 76 | Ht 63.0 in | Wt 226.1 lb

## 2016-07-03 DIAGNOSIS — I10 Essential (primary) hypertension: Secondary | ICD-10-CM

## 2016-07-03 DIAGNOSIS — E785 Hyperlipidemia, unspecified: Secondary | ICD-10-CM

## 2016-07-03 DIAGNOSIS — R000 Tachycardia, unspecified: Secondary | ICD-10-CM

## 2016-07-03 DIAGNOSIS — I6300 Cerebral infarction due to thrombosis of unspecified precerebral artery: Secondary | ICD-10-CM | POA: Diagnosis not present

## 2016-07-03 NOTE — Patient Instructions (Signed)
Medication Instructions:  Your physician recommends that you continue on your current medications as directed. Please refer to the Current Medication list given to you today.  Labwork: None ordered.  Testing/Procedures: None ordered.  Follow-Up: Your physician recommends that you schedule a follow-up appointment as needed.   Any Other Special Instructions Will Be Listed Below (If Applicable).     If you need a refill on your cardiac medications before your next appointment, please call your pharmacy.   

## 2016-07-03 NOTE — Progress Notes (Signed)
Cardiology Office Note    Date:  07/03/2016   ID:  April Armstrong, DOB 1950/11/27, MRN [id number]  PCP:  Donnie Coffin, MD  Cardiologist: Sinclair Grooms, MD   Chief Complaint  Patient presents with  . Hospitalization Follow-up    History of Present Illness:  April Armstrong is a 65 y.o. female who wishes to establish with a cardiologist because of a significant family history of heart disease (atrial fibrillation, coronary artery disease, CHF, hypertension), hyperlipidemia, and hypertension. She has not normal resting EKG with anterior T-wave abnormality.  Patient seems to be quite anxious. Is no prior history of cardiac abnormality. She did have a recent stroke. It was felt to be an ischemic right cortical infarct. Mild atheromatous irregularity was noted overall it was felt that the event was likely embolic.  30 day monitor has not demonstrated any arrhythmia that can cause embolic events. Echocardiogram is normal. Carotid Dopplers did not reveal any atherosclerotic abnormality.    Past Medical History:  Diagnosis Date  . Depression   . GERD (gastroesophageal reflux disease)   . Hypercholesterolemia   . Hypertension   . Hypothyroidism   . IBS (irritable bowel syndrome)   . Stroke ([place])   . Thyroid disease     Past Surgical History:  Procedure Laterality Date  . ABDOMINAL HYSTERECTOMY    . APPENDECTOMY    . CESAREAN SECTION    . CHOLECYSTECTOMY    . THYROIDECTOMY    . TRIGGER FINGER RELEASE      Current Medications: Outpatient Medications Prior to Visit  Medication Sig Dispense Refill  . aspirin 325 MG tablet Take 1 tablet (325 mg total) by mouth daily.    Marland Kitchen atorvastatin (LIPITOR) 80 MG tablet Take 1 tablet (80 mg total) by mouth at bedtime. 30 tablet 0  . buPROPion (WELLBUTRIN XL) 300 MG 24 hr tablet Take 300 mg by mouth daily.     Marland Kitchen ibuprofen (ADVIL,MOTRIN) 200 MG tablet Take 800 mg by mouth every 6 (six) hours as needed for moderate pain.    Marland Kitchen lamoTRIgine  (LAMICTAL) 100 MG tablet Take 100 mg by mouth daily.     Marland Kitchen levothyroxine (SYNTHROID, LEVOTHROID) 100 MCG tablet Take 100 mcg by mouth daily before breakfast.     . loratadine (CLARITIN) 10 MG tablet Take 10 mg by mouth daily as needed for allergies.     Marland Kitchen LORazepam (ATIVAN) 0.5 MG tablet Take 0.5 mg by mouth every 6 (six) hours as needed for anxiety.     . metoprolol succinate (TOPROL-XL) 25 MG 24 hr tablet Take 25 mg by mouth daily.     Marland Kitchen omeprazole (PRILOSEC) 40 MG capsule Take 40 mg by mouth daily.     . Prenatal Vit-Fe Fumarate-FA (PRENATAL MULTIVITAMIN) TABS tablet Take 1 tablet by mouth daily at 12 noon.    . RESTASIS 0.05 % ophthalmic emulsion Place 1 drop into both eyes 2 (two) times daily.     . sertraline (ZOLOFT) 100 MG tablet Take 100 mg by mouth daily.     Marland Kitchen spironolactone (ALDACTONE) 25 MG tablet Take 25 mg by mouth daily.     . traZODone (DESYREL) 150 MG tablet Take 75 mg by mouth at bedtime.     . vitamin B-12 (CYANOCOBALAMIN) 100 MCG tablet Take 100 mcg by mouth at bedtime.    . meloxicam (MOBIC) 15 MG tablet Take 15 mg by mouth daily.      No facility-administered medications prior to visit.  Allergies:   Review of patient's allergies indicates no known allergies.   Social History   Social History  . Marital status: Unknown    Spouse name: N/A  . Number of children: N/A  . Years of education: N/A   Social History Main Topics  . Smoking status: Never Smoker  . Smokeless tobacco: Never Used  . Alcohol use No  . Drug use: No  . Sexual activity: Not Asked   Other Topics Concern  . None   Social History Narrative  . None     Family History:  The patient's family history includes Congestive Heart Failure in her sister; Dementia in her mother; Heart disease in her brother and father; Hypercholesterolemia in her mother; Stroke in her father.   ROS:   Please see the history of present illness.    Chronic back pain, anxiety, difficulty sleeping, occasional  dizziness.  All other systems reviewed and are negative.   PHYSICAL EXAM:   VS:  BP 102/66   Pulse 76   Ht 5\' 3"  (1.6 m)   Wt 226 lb 1.9 oz (102.6 kg)   BMI 40.06 kg/m    GEN: Well nourished, well developed, in no acute distress. Obese.  HEENT: normal  Neck: no JVD, carotid bruits, or masses Cardiac: RRR; no murmurs, rubs, or gallops,no edema  Respiratory:  clear to auscultation bilaterally, normal work of breathing GI: soft, nontender, nondistended, + BS MS: no deformity or atrophy  Skin: warm and dry, no rash Neuro:  Alert and Oriented x 3, Strength and sensation are intact Psych: euthymic mood, full affect  Wt Readings from Last 3 Encounters:  07/03/16 226 lb 1.9 oz (102.6 kg)  06/21/16 229 lb (103.9 kg)  04/13/16 223 lb 8 oz (101.4 kg)      Studies/Labs Reviewed:   EKG:  EKG  Normal sinus rhythm with flattened/biphasic  Recent Labs: 04/12/2016: BUN 21; Creatinine, Ser 0.77; Hemoglobin 13.9; Platelets 236; Potassium 4.0; Sodium 140 04/13/2016: TSH 3.067   Lipid Panel    Component Value Date/Time   CHOL 167 04/13/2016 0512   TRIG 172 (H) 04/13/2016 0512   HDL 41 04/13/2016 0512   CHOLHDL 4.1 04/13/2016 0512   VLDL 34 04/13/2016 0512   LDLCALC 92 04/13/2016 0512    Additional studies/ records that were reviewed today include:  Echocardiography, May 2017: Study Conclusions  - Left ventricle: The cavity size was normal. Systolic function was   normal. The estimated ejection fraction was in the range of 55%   to 60%. Wall motion was normal; there were no regional wall   motion abnormalities. Doppler parameters are consistent with   abnormal left ventricular relaxation (grade 1 diastolic   dysfunction). - Mitral valve: Calcified annulus.  Impressions:  - Normal LV systolic function; grade 1 diastolic dysfunction.   Carotid ultrasound: May 2017 Summary:  - The vertebral arteries appear patent with antegrade flow. - Findings consistent with 1-39  percent stenosis involving the   right internal carotid artery and the left internal carotid   artery.  Other specific details can be found in the table(s) above.   Thirty-day continuous monitor: 03/2016 Study Highlights    Normal study with NSR as basic rhythm.  Rare PAC's.  No pauses or arrhythmia.  No correlation between complaints and rhythm.   Normal study with no correlation between complaints of dizziness and heart rhythm.     ASSESSMENT:    1. Rapid heart rate   2. Essential hypertension   3.  Cerebrovascular accident (CVA) due to thrombosis of precerebral artery ([place])   4. Hyperlipidemia   5. Morbid obesity due to excess calories ([place])      PLAN:  In order of problems listed above:  1. No abnormalities noted on 30 day monitor. 2. Excellent blood pressure control. Target 130/90 or less 3. This was felt to be embolic. Negative workup occurred in the hospital. Platelet therapy in the form of aspirin. Followed by neurology. 4. Goal LDL cholesterol of 70 discuss. Currently on had intensity statin therapy. 5. Current weight loss as this will decrease generalized inflammation in her body that could be a culprit in the genesis of her stroke.  No definitive clinical abnormalities to suggest ongoing cardiac problem. We'll see the patient as needed  Medication Adjustments/Labs and Tests Ordered: Current medicines are reviewed at length with the patient today.  Concerns regarding medicines are outlined above.  Medication changes, Labs and Tests ordered today are listed in the Patient Instructions below. Patient Instructions  Medication Instructions:  Your physician recommends that you continue on your current medications as directed. Please refer to the Current Medication list given to you today.   Labwork: None ordered  Testing/Procedures: None ordered  Follow-Up: Your physician recommends that you schedule a follow-up appointment as needed   Any Other  Special Instructions Will Be Listed Below (If Applicable).     If you need a refill on your cardiac medications before your next appointment, please call your pharmacy.      Signed, Sinclair Grooms, MD  07/03/2016 11:25 AM    [address], [place], [place]  [address] Phone: [phone]; Fax: [phone]

## 2016-07-17 DIAGNOSIS — F330 Major depressive disorder, recurrent, mild: Secondary | ICD-10-CM | POA: Diagnosis not present

## 2016-07-24 DIAGNOSIS — F330 Major depressive disorder, recurrent, mild: Secondary | ICD-10-CM | POA: Diagnosis not present

## 2016-08-01 DIAGNOSIS — F330 Major depressive disorder, recurrent, mild: Secondary | ICD-10-CM | POA: Diagnosis not present

## 2016-08-07 DIAGNOSIS — L570 Actinic keratosis: Secondary | ICD-10-CM | POA: Diagnosis not present

## 2016-08-07 DIAGNOSIS — L821 Other seborrheic keratosis: Secondary | ICD-10-CM | POA: Diagnosis not present

## 2016-08-07 DIAGNOSIS — Z85828 Personal history of other malignant neoplasm of skin: Secondary | ICD-10-CM | POA: Diagnosis not present

## 2016-08-07 DIAGNOSIS — D225 Melanocytic nevi of trunk: Secondary | ICD-10-CM | POA: Diagnosis not present

## 2016-08-07 DIAGNOSIS — L853 Xerosis cutis: Secondary | ICD-10-CM | POA: Diagnosis not present

## 2016-08-07 DIAGNOSIS — H61001 Unspecified perichondritis of right external ear: Secondary | ICD-10-CM | POA: Diagnosis not present

## 2016-08-07 DIAGNOSIS — D2271 Melanocytic nevi of right lower limb, including hip: Secondary | ICD-10-CM | POA: Diagnosis not present

## 2016-08-13 DIAGNOSIS — F330 Major depressive disorder, recurrent, mild: Secondary | ICD-10-CM | POA: Diagnosis not present

## 2016-08-20 DIAGNOSIS — F330 Major depressive disorder, recurrent, mild: Secondary | ICD-10-CM | POA: Diagnosis not present

## 2016-08-31 DIAGNOSIS — F330 Major depressive disorder, recurrent, mild: Secondary | ICD-10-CM | POA: Diagnosis not present

## 2016-09-17 DIAGNOSIS — I10 Essential (primary) hypertension: Secondary | ICD-10-CM | POA: Diagnosis not present

## 2016-09-17 DIAGNOSIS — R05 Cough: Secondary | ICD-10-CM | POA: Diagnosis not present

## 2016-09-17 DIAGNOSIS — R9431 Abnormal electrocardiogram [ECG] [EKG]: Secondary | ICD-10-CM | POA: Diagnosis not present

## 2016-09-17 DIAGNOSIS — R0789 Other chest pain: Secondary | ICD-10-CM | POA: Diagnosis not present

## 2016-09-17 DIAGNOSIS — J209 Acute bronchitis, unspecified: Secondary | ICD-10-CM | POA: Diagnosis not present

## 2016-09-17 DIAGNOSIS — Z79899 Other long term (current) drug therapy: Secondary | ICD-10-CM | POA: Diagnosis not present

## 2016-09-17 DIAGNOSIS — Z7982 Long term (current) use of aspirin: Secondary | ICD-10-CM | POA: Diagnosis not present

## 2016-09-17 DIAGNOSIS — J069 Acute upper respiratory infection, unspecified: Secondary | ICD-10-CM | POA: Diagnosis not present

## 2016-09-24 DIAGNOSIS — F330 Major depressive disorder, recurrent, mild: Secondary | ICD-10-CM | POA: Diagnosis not present

## 2016-10-01 DIAGNOSIS — F330 Major depressive disorder, recurrent, mild: Secondary | ICD-10-CM | POA: Diagnosis not present

## 2016-10-08 DIAGNOSIS — F330 Major depressive disorder, recurrent, mild: Secondary | ICD-10-CM | POA: Diagnosis not present

## 2016-10-22 ENCOUNTER — Ambulatory Visit (INDEPENDENT_AMBULATORY_CARE_PROVIDER_SITE_OTHER): Payer: Medicare Other | Admitting: Neurology

## 2016-10-22 ENCOUNTER — Encounter: Payer: Self-pay | Admitting: Neurology

## 2016-10-22 VITALS — BP 116/76 | HR 66 | Wt 225.1 lb

## 2016-10-22 DIAGNOSIS — I10 Essential (primary) hypertension: Secondary | ICD-10-CM

## 2016-10-22 DIAGNOSIS — H8110 Benign paroxysmal vertigo, unspecified ear: Secondary | ICD-10-CM

## 2016-10-22 DIAGNOSIS — I639 Cerebral infarction, unspecified: Secondary | ICD-10-CM | POA: Diagnosis not present

## 2016-10-22 DIAGNOSIS — F411 Generalized anxiety disorder: Secondary | ICD-10-CM | POA: Diagnosis not present

## 2016-10-22 DIAGNOSIS — E785 Hyperlipidemia, unspecified: Secondary | ICD-10-CM | POA: Diagnosis not present

## 2016-10-22 DIAGNOSIS — I63311 Cerebral infarction due to thrombosis of right middle cerebral artery: Secondary | ICD-10-CM | POA: Diagnosis not present

## 2016-10-22 DIAGNOSIS — M5481 Occipital neuralgia: Secondary | ICD-10-CM

## 2016-10-22 NOTE — Patient Instructions (Addendum)
-   continue ASA and lipitor for stroke prevention - check BP at home and record - Follow up with your primary care physician for stroke risk factor modification. Recommend maintain blood pressure goal <130/80, diabetes with hemoglobin A1c goal below 7.0% and lipids with LDL cholesterol goal below 70 mg/dL.  - relaxation and cope with stress - continue psychology follow up - will refer to PT for BPPV maneuver training. Whenever you have vertigo, do the maneuver 5-10 times a day for 2-3 weeks.  - make appointment with cardiology to consider long term heart monitoring I.e. Loop recorder for heart palpitation, especially the episode in October in [place].  - follow up in 6 months

## 2016-10-22 NOTE — Progress Notes (Signed)
STROKE NEUROLOGY FOLLOW UP NOTE  NAME: April Armstrong DOB: 10/02/1951  REASON FOR VISIT: stroke follow up HISTORY FROM: pt and chart  Today we had the pleasure of seeing April Armstrong in follow-up at our Neurology Clinic. Pt was accompanied by no one.   History Summary April Armstrong is a 65 y.o. female with history of hyperlipidemia, remote history of migraines, palpitations, hypertension, and hypothyroidism admitted on 04/12/16 after a recent fall, vertigo, headache, and balance problems.  MRI showed small acute infarct involving the posterior right frontal lobe. MRA, carotid Doppler, TTE all negative. LDL 92 and A1c 6.2.  She does have history of palpitations and recommend 30 day cardiac event monitor as outpatient.  She had occipital neuralgia and received occipital nerve block bilaterally.  She was discharged with aspirin and Lipitor.  06/21/16 follow up - the patient has been doing well. No recurrent strokelike symptoms. Occipital neurology has not flared up yet.  30 day monitoring negative for A. Fib. On aspirin and Lipitor, without complaints.  Has frequent anxiety spells, on Wellbutrin, Lamictal, Ativan,  Zoloft and trazodone. BP 103/72.  Interval History During the interval time, pt followed with cardiology Dr. Tamala Julian in 06/2016. However, one month ago, she was in [place] and went to urgent care for palpitation and SOB. He was admitted for evaluation and was told abnormal EKGs. However, no diagnosis given beside acute bronchitis. She has not seen Dr. Tamala Julian yet since came back. She also complains of brief vertigo everyday for a while when lying down at night before sleep. No vertigo on sitting up in the morning. BP 116/76 today.   REVIEW OF SYSTEMS: Full 14 system review of systems performed and notable only for those listed below and in HPI above, all others are negative:  Constitutional:   Fatigue, chills Cardiovascular:  palpitation Ear/Nose/Throat:   Skin:  Eyes:   Blurry  vision Respiratory:  wheezing Gastroitestinal:   Genitourinary:  Hematology/Lymphatic:    Endocrine:  Excessive thirst Musculoskeletal:  Walking difficulty Allergy/Immunology:   Neurological:   dizziness Psychiatric:  Depression, nervous, anxious Sleep:  Daytime sleepiness, snoring, sleep talking  The following represents the patient's updated allergies and side effects list: No Known Allergies  The neurologically relevant items on the patient's problem list were reviewed on today's visit.  Neurologic Examination  A problem focused neurological exam (12 or more points of the single system neurologic examination, vital signs counts as 1 point, cranial nerves count for 8 points) was performed.  Blood pressure 116/76, pulse 66, weight 225 lb 1.6 oz (102.1 kg).  General - Well nourished, well developed, in no apparent distress.  Ophthalmologic - Sharp disc margins OU.   Cardiovascular - Regular rate and rhythm with no murmur.  Mental Status -  Level of arousal and orientation to time, place, and person were intact. Language including expression, naming, repetition, comprehension was assessed and found intact. Attention span and concentration were normal. Recent and remote memory were intact. Fund of Knowledge was assessed and was intact.  Cranial Nerves II - XII - II - Visual field intact OU. III, IV, VI - Extraocular movements intact. V - Facial sensation intact bilaterally. VII - Facial movement intact bilaterally. VIII - Hearing & vestibular intact bilaterally. X - Palate elevates symmetrically. XI - Chin turning & shoulder shrug intact bilaterally. XII - Tongue protrusion intact.  Motor Strength - The patient's strength was normal in all extremities and pronator drift was absent.  Bulk was normal and fasciculations were absent.  Motor Tone - Muscle tone was assessed at the neck and appendages and was normal.  Reflexes - The patient's reflexes were 1+ in all  extremities and she had no pathological reflexes.  Sensory - Light touch, temperature/pinprick, vibration and proprioception, and Romberg testing were assessed and were normal.    Coordination - The patient had normal movements in the hands and feet with no ataxia or dysmetria.  Tremor was absent.  Gait and Station - The patient's transfers, posture, gait, station, and turns were observed as normal.   Data reviewed: I personally reviewed the images and agree with the radiology interpretations.  Mri and Mra Head 04/12/2016    MRI HEAD 1. 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 No associated hemorrhage or mass effect.  2. No other acute intracranial process identified.  3. Mild generalized age-related cerebral atrophy. Multifocal patchy T2/FLAIR foci involving the cerebral white matter of both cerebral hemispheres is somewhat nonspecific, but most likely related to moderate chronic small vessel ischemic disease.   MRA HEAD  1. No large or proximal arterial branch occlusion within the intracranial circulation. No high-grade or correctable stenosis.  2. Azygos ACA, which is largely supplied via the left internal carotid artery system. Right ICA slightly diminutive, likely related to hypoplastic right A1 segment.  3. Mild distal small vessel atheromatous irregularity within the MCA and PCA branches bilaterally.   Bilateral Carotid duplex 04/13/2016 Summary: - The vertebral arteries appear patent with antegrade flow. - Findings consistent with 1-39 percent stenosis involving the  right internal carotid artery and the left internal carotid  artery.  Transthoracic echocardiogram 04/13/2016 Study Conclusions - Left ventricle: The cavity size was normal. Systolic function was  normal. The estimated ejection fraction was in the range of 55%  to 60%. Wall motion was normal; there were no regional wall  motion abnormalities. Doppler  parameters are consistent with  abnormal left ventricular relaxation (grade 1 diastolic  dysfunction). - Mitral valve: Calcified annulus. Impressions: - Normal LV systolic function; grade 1 diastolic dysfunction.   30 day cardiac event monitoring -  No A. fib  Component     Latest Ref Rng & Units 04/13/2016 04/14/2016  Cholesterol     0 - 200 mg/dL 167   Triglycerides     <150 mg/dL 172 (H)   HDL Cholesterol     >40 mg/dL 41   Total CHOL/HDL Ratio     RATIO 4.1   VLDL     0 - 40 mg/dL 34   LDL (calc)     0 - 99 mg/dL 92   Hemoglobin A1C     4.8 - 5.6 % 6.2 (H)   Mean Plasma Glucose     mg/dL 131   TSH     0.350 - 4.500 uIU/mL 3.067   Vitamin B12     180 - 914 pg/mL  1,137 (H)    Assessment: As you may recall, she is a 65 y.o. Caucasian female with PMH of hyperlipidemia, remote history of migraines, palpitations, hypertension, and hypothyroidism admitted on 04/12/16 for a small acute infarct involving the posterior right frontal lobe. MRA, carotid Doppler, TTE all negative. LDL 92 and A1c 6.2.  She does have history of palpitations and recommend 30 day cardiac event monitor as outpatient.  She had occipital neuralgia and received occipital nerve block bilaterally.  She was discharged with aspirin and Lipitor. During the interval time, the patient has been doing well.  30 today cardiac event  monitoring no A. Fib. Occipital neuralgia has not flared up yet.  On aspirin and Lipitor, without complaints. Has frequent anxiety spells, on Wellbutrin, Lamictal, Ativan,  Zoloft and trazodone. Has psychology counseling and felt helpful. Followed with cardiology and had recent admission in [place] for palpitation. EKG abnormal as per pt but no further details. Recommend to follow up with cardiology closely. 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    Plan:  - continue ASA and lipitor for stroke prevention - check BP at home and record - Follow up with your primary  care physician for stroke risk factor modification. Recommend maintain blood pressure goal <130/80, diabetes with hemoglobin A1c goal below 7.0% and lipids with LDL cholesterol goal below 70 mg/dL.  - relaxation and cope with stress - continue psychology follow up - will refer to PT for BPPV maneuver training.  - follow up with cardiology soon to consider long term heart monitoring i.e. Loop recorder for heart palpitation with hx of stroke.  - follow up in 6 months   I spent more than 25 minutes of face to face time with the patient. Greater than 50% of time was spent in counseling and coordination of care. We discussed about cardiology follow up, BPPV diagnosis and treatment.   Orders Placed This Encounter  Procedures  . Ambulatory referral to Physical Therapy    Referral Priority:   Routine    Referral Type:   Physical Medicine    Referral Reason:   Specialty Services Required    Requested Specialty:   Physical Therapy    Number of Visits Requested:   1    No orders of the defined types were placed in this encounter.   Patient Instructions  - continue ASA and lipitor for stroke prevention - check BP at home and record - Follow up with your primary care physician for stroke risk factor modification. Recommend maintain blood pressure goal <130/80, diabetes with hemoglobin A1c goal below 7.0% and lipids with LDL cholesterol goal below 70 mg/dL.  - relaxation and cope with stress - continue psychology follow up - will refer to PT for BPPV maneuver training. Whenever you have vertigo, do the maneuver 5-10 times a day for 2-3 weeks.  - make appointment with cardiology to consider long term heart monitoring I.e. Loop recorder for heart palpitation, especially the episode in October in [place].  - follow up in 6 months    Rosalin Hawking, MD PhD [facility] [address] [place], [place] [address] [phone]

## 2016-10-23 DIAGNOSIS — F330 Major depressive disorder, recurrent, mild: Secondary | ICD-10-CM | POA: Diagnosis not present

## 2016-12-03 ENCOUNTER — Ambulatory Visit (INDEPENDENT_AMBULATORY_CARE_PROVIDER_SITE_OTHER): Payer: Medicare Other | Admitting: Interventional Cardiology

## 2016-12-03 ENCOUNTER — Encounter: Payer: Self-pay | Admitting: Interventional Cardiology

## 2016-12-03 VITALS — BP 122/74 | HR 79 | Ht 63.0 in | Wt 223.0 lb

## 2016-12-03 DIAGNOSIS — R000 Tachycardia, unspecified: Secondary | ICD-10-CM | POA: Diagnosis not present

## 2016-12-03 DIAGNOSIS — R0609 Other forms of dyspnea: Secondary | ICD-10-CM

## 2016-12-03 DIAGNOSIS — R0602 Shortness of breath: Secondary | ICD-10-CM

## 2016-12-03 DIAGNOSIS — I6340 Cerebral infarction due to embolism of unspecified cerebral artery: Secondary | ICD-10-CM

## 2016-12-03 DIAGNOSIS — I10 Essential (primary) hypertension: Secondary | ICD-10-CM | POA: Diagnosis not present

## 2016-12-03 DIAGNOSIS — E7849 Other hyperlipidemia: Secondary | ICD-10-CM

## 2016-12-03 DIAGNOSIS — E784 Other hyperlipidemia: Secondary | ICD-10-CM | POA: Diagnosis not present

## 2016-12-03 DIAGNOSIS — R0600 Dyspnea, unspecified: Secondary | ICD-10-CM

## 2016-12-03 NOTE — Patient Instructions (Signed)
Medication Instructions:  None  Labwork: None  Testing/Procedures: Your physician has requested that you have a lexiscan myoview. For further information please visit [email or web address]. Please follow instruction sheet, as given.    Follow-Up: Your physician wants you to follow-up in: 6 months with Dr. Tamala Julian. You will receive a reminder letter in the mail two months in advance. If you don't receive a letter, please call our office to schedule the follow-up appointment.   Any Other Special Instructions Will Be Listed Below (If Applicable).  You have been referred to our Electrophysiology physicians for loop recorder placement. (Dr. Tamala Julian ok with whomever has next available availability)    If you need a refill on your cardiac medications before your next appointment, please call your pharmacy.

## 2016-12-03 NOTE — Progress Notes (Signed)
Cardiology Office Note    Date:  12/03/2016   ID:  April Armstrong, DOB [birth year], MRN [id number]  PCP:  April Coffin, MD  Cardiologist: Sinclair Grooms, MD   Chief Complaint  Patient presents with  . Atrial Fibrillation  . Hospitalization Follow-up    History of Present Illness:  April Armstrong is a 66 y.o. female with hypertension, history of stroke (felt embolic), and an occasional palpitations.  April Armstrong suffered a stroke in late spring 2017. Heart was structurally normal and echocardiography without explanation for stroke. 30- day monitor did not demonstrate any arrhythmias that would predispose to embolization. In October while visiting in [place]. She developed shortness of breath and cough. She was seen in an urgent care and sent to the emergency room where cardiac studies were done that included serial EKGs and blood work. No cardiac diagnosis was made. She has recently followed up with Dr. Erlinda Hong of our neurology unit [facility] who feels she should have a loop recorder. Further evaluation for possible atrial fibrillation is needed.  She has a family history of coronary artery disease. A brother has had coronary bypass and a sister has CHF.  Past Medical History:  Diagnosis Date  . Depression   . GERD (gastroesophageal reflux disease)   . Hypercholesterolemia   . Hypertension   . Hypothyroidism   . IBS (irritable bowel syndrome)   . Stroke ([place])   . Thyroid disease     Past Surgical History:  Procedure Laterality Date  . ABDOMINAL HYSTERECTOMY    . APPENDECTOMY    . CESAREAN SECTION    . CHOLECYSTECTOMY    . THYROIDECTOMY    . TRIGGER FINGER RELEASE      Current Medications: Outpatient Medications Prior to Visit  Medication Sig Dispense Refill  . aspirin 325 MG tablet Take 1 tablet (325 mg total) by mouth daily.    Marland Kitchen atorvastatin (LIPITOR) 80 MG tablet Take 1 tablet (80 mg total) by mouth at bedtime. 30 tablet 0  . buPROPion (WELLBUTRIN XL) 300  MG 24 hr tablet Take 300 mg by mouth daily.     Marland Kitchen GARCINIA CAMBOGIA-CHROMIUM PO Take 2 tablets by mouth daily.    Marland Kitchen ibuprofen (ADVIL,MOTRIN) 200 MG tablet Take 800 mg by mouth every 6 (six) hours as needed for moderate pain.    Marland Kitchen lamoTRIgine (LAMICTAL) 100 MG tablet Take 100 mg by mouth daily.     Marland Kitchen levothyroxine (SYNTHROID, LEVOTHROID) 100 MCG tablet Take 100 mcg by mouth daily before breakfast.     . loratadine (CLARITIN) 10 MG tablet Take 10 mg by mouth daily as needed for allergies.     Marland Kitchen LORazepam (ATIVAN) 0.5 MG tablet Take 0.5 mg by mouth every 6 (six) hours as needed for anxiety.     . metoprolol succinate (TOPROL-XL) 25 MG 24 hr tablet Take 25 mg by mouth daily.     Marland Kitchen omeprazole (PRILOSEC) 40 MG capsule Take 40 mg by mouth daily.     Marland Kitchen OVER THE COUNTER MEDICATION Take 1 capsule by mouth daily. Med Name: [place]    . Prenatal Vit-Fe Fumarate-FA (PRENATAL MULTIVITAMIN) TABS tablet Take 1 tablet by mouth daily at 12 noon.    . RESTASIS 0.05 % ophthalmic emulsion Place 1 drop into both eyes 2 (two) times daily.     . sertraline (ZOLOFT) 100 MG tablet Take 100 mg by mouth daily.     Marland Kitchen spironolactone (ALDACTONE) 25 MG tablet Take  25 mg by mouth daily.     . traZODone (DESYREL) 150 MG tablet Take 75 mg by mouth at bedtime.     . vitamin B-12 (CYANOCOBALAMIN) 100 MCG tablet Take 100 mcg by mouth at bedtime.     No facility-administered medications prior to visit.      Allergies:   Patient has no known allergies.   Social History   Social History  . Marital status: Unknown    Spouse name: N/A  . Number of children: N/A  . Years of education: N/A   Social History Main Topics  . Smoking status: Never Smoker  . Smokeless tobacco: Never Used  . Alcohol use No  . Drug use: No  . Sexual activity: Not Asked   Other Topics Concern  . None   Social History Narrative  . None     Family History:  The patient's family history includes Congestive Heart Failure  in her sister; Dementia in her mother; Heart disease in her brother and father; Hypercholesterolemia in her mother; Stroke in her father.   ROS:   Please see the history of present illness.    Depression, vision disturbance, recent change in appetite, chills, leg swelling, vision disturbance, anxiety, difficulty with balance, snoring, and excessive fatigue.  All other systems reviewed and are negative.   PHYSICAL EXAM:   VS:  BP 122/74 (BP Location: Left Arm)   Pulse 79   Ht 5\' 3"  (1.6 m)   Wt 223 lb (101.2 kg)   BMI 39.50 kg/m    GEN: Well nourished, well developed, in no acute distress  HEENT: normal  Neck: no JVD, carotid bruits, or masses Cardiac: RRR; no murmurs, rubs, or gallops,no edema  Respiratory:  clear to auscultation bilaterally, normal work of breathing GI: soft, nontender, nondistended, + BS MS: no deformity or atrophy  Skin: warm and dry, no rash Neuro:  Alert and Oriented x 3, Strength and sensation are intact Psych: euthymic mood, full affect  Wt Readings from Last 3 Encounters:  12/03/16 223 lb (101.2 kg)  10/22/16 225 lb 1.6 oz (102.1 kg)  07/03/16 226 lb 1.9 oz (102.6 kg)      Studies/Labs Reviewed:   EKG:  EKG  Repeated  Recent Labs: 04/12/2016: BUN 21; Creatinine, Ser 0.77; Hemoglobin 13.9; Platelets 236; Potassium 4.0; Sodium 140 04/13/2016: TSH 3.067   Lipid Panel    Component Value Date/Time   CHOL 167 04/13/2016 0512   TRIG 172 (H) 04/13/2016 0512   HDL 41 04/13/2016 0512   CHOLHDL 4.1 04/13/2016 0512   VLDL 34 04/13/2016 0512   LDLCALC 92 04/13/2016 0512    Additional studies/ records that were reviewed today include:  30 day event monitor July 2017: Study Highlights    Normal study with NSR as basic rhythm.  Rare PAC's.  No pauses or arrhythmia.  No correlation between complaints and rhythm.   Normal study with no correlation between complaints of dizziness and heart rhythm.    Echocardiogram May 2017: Study  Conclusions  - Left ventricle: The cavity size was normal. Systolic function was   normal. The estimated ejection fraction was in the range of 55%   to 60%. Wall motion was normal; there were no regional wall   motion abnormalities. Doppler parameters are consistent with   abnormal left ventricular relaxation (grade 1 diastolic   dysfunction). - Mitral valve: Calcified annulus.  Impressions:  - Normal LV systolic function; grade 1 diastolic dysfunction.   ASSESSMENT:    1.  Essential hypertension   2. Tachycardia   3. Other hyperlipidemia   4. Dyspnea on exertion   5. Cerebrovascular accident (CVA) due to embolism of cerebral artery ([place])   6. SOB (shortness of breath)      PLAN:  In order of problems listed above:  1. Moderate control. Low salt diet and weight loss advocated. Aerobic activity is recommended. 2. No significant arrhythmias noted on monitor. Will arrange Loop recorder placement to exclude atrial fibrillation in this patient with prior history of CVA. Will refer to our EP team for this procedure. 3. LDL target is less than 70 4. Lexiscan Myoview to exclude occult coronary disease given strong family history of vascular disease. 5. After being seen by Dr.Xu, the assumption is that her 2017 infarction was embolic. Please see above. Loop has been recommended. She has been referred to EP.  Clinical follow-up with me in 6 months. Call if recurrent dyspnea or any new neurological complaints.  Medication Adjustments/Labs and Tests Ordered: Current medicines are reviewed at length with the patient today.  Concerns regarding medicines are outlined above.  Medication changes, Labs and Tests ordered today are listed in the Patient Instructions below. Patient Instructions  Medication Instructions:  None  Labwork: None  Testing/Procedures: Your physician has requested that you have a lexiscan myoview. For further information please visit [email or web address]. Please  follow instruction sheet, as given.    Follow-Up: Your physician wants you to follow-up in: 6 months with Dr. Tamala Julian. You will receive a reminder letter in the mail two months in advance. If you don't receive a letter, please call our office to schedule the follow-up appointment.   Any Other Special Instructions Will Be Listed Below (If Applicable).  You have been referred to our Electrophysiology physicians for loop recorder placement. (Dr. Tamala Julian ok with whomever has next available availability)    If you need a refill on your cardiac medications before your next appointment, please call your pharmacy.      Signed, Sinclair Grooms, MD  12/03/2016 5:20 PM    [facility] [address], [place], [place]  [address] Phone: [phone]; Fax: [phone]

## 2016-12-04 ENCOUNTER — Telehealth (HOSPITAL_COMMUNITY): Payer: Self-pay | Admitting: *Deleted

## 2016-12-04 NOTE — Telephone Encounter (Signed)
Patient given detailed instructions per Myocardial Perfusion Study Information Sheet for the test on 12/06/16 at 0745. Patient notified to arrive 15 minutes early and that it is imperative to arrive on time for appointment to keep from having the test rescheduled.  If you need to cancel or reschedule your appointment, please call the office within 24 hours of your appointment. Failure to do so may result in a cancellation of your appointment, and a $50 no show fee. Patient verbalized understanding.Bekim Werntz, Ranae [place]

## 2016-12-05 ENCOUNTER — Telehealth: Payer: Self-pay | Admitting: Interventional Cardiology

## 2016-12-05 ENCOUNTER — Ambulatory Visit (HOSPITAL_COMMUNITY): Payer: Medicare Other | Attending: Cardiovascular Disease

## 2016-12-05 DIAGNOSIS — I10 Essential (primary) hypertension: Secondary | ICD-10-CM | POA: Diagnosis not present

## 2016-12-05 DIAGNOSIS — E7800 Pure hypercholesterolemia, unspecified: Secondary | ICD-10-CM | POA: Diagnosis not present

## 2016-12-05 DIAGNOSIS — R0602 Shortness of breath: Secondary | ICD-10-CM | POA: Insufficient documentation

## 2016-12-05 DIAGNOSIS — I4891 Unspecified atrial fibrillation: Secondary | ICD-10-CM | POA: Insufficient documentation

## 2016-12-05 MED ORDER — TECHNETIUM TC 99M TETROFOSMIN IV KIT
32.1000 | PACK | Freq: Once | INTRAVENOUS | Status: AC | PRN
  Administered 2016-12-05: 32.1 via INTRAVENOUS
  Filled 2016-12-05: qty 33

## 2016-12-05 MED ORDER — REGADENOSON 0.4 MG/5ML IV SOLN
0.4000 mg | Freq: Once | INTRAVENOUS | Status: AC
Start: 2016-12-05 — End: 2016-12-05
  Administered 2016-12-05: 0.4 mg via INTRAVENOUS

## 2016-12-06 ENCOUNTER — Encounter (HOSPITAL_COMMUNITY): Payer: Medicare Other

## 2016-12-06 ENCOUNTER — Ambulatory Visit (HOSPITAL_COMMUNITY): Payer: Medicare Other | Attending: Cardiovascular Disease

## 2016-12-06 LAB — MYOCARDIAL PERFUSION IMAGING
CHL CUP NUCLEAR SRS: 7
CHL CUP NUCLEAR SSS: 5
CSEPPHR: 90 {beats}/min
LHR: 0.33
LV dias vol: 78 mL (ref 46–106)
LV sys vol: 31 mL
NUC STRESS TID: 1.02
Rest HR: 69 {beats}/min
SDS: 1

## 2016-12-06 MED ORDER — TECHNETIUM TC 99M TETROFOSMIN IV KIT
32.1000 | PACK | Freq: Once | INTRAVENOUS | Status: AC | PRN
  Administered 2016-12-06: 32.1 via INTRAVENOUS
  Filled 2016-12-06: qty 33

## 2016-12-07 ENCOUNTER — Ambulatory Visit (INDEPENDENT_AMBULATORY_CARE_PROVIDER_SITE_OTHER): Payer: Medicare Other | Admitting: Internal Medicine

## 2016-12-07 VITALS — BP 112/70 | HR 72 | Ht 63.0 in | Wt 222.0 lb

## 2016-12-07 DIAGNOSIS — B349 Viral infection, unspecified: Secondary | ICD-10-CM | POA: Diagnosis not present

## 2016-12-07 DIAGNOSIS — I6340 Cerebral infarction due to embolism of unspecified cerebral artery: Secondary | ICD-10-CM

## 2016-12-07 DIAGNOSIS — I10 Essential (primary) hypertension: Secondary | ICD-10-CM | POA: Diagnosis not present

## 2016-12-07 DIAGNOSIS — J040 Acute laryngitis: Secondary | ICD-10-CM | POA: Diagnosis not present

## 2016-12-07 NOTE — Patient Instructions (Signed)
Medication Instructions:  Your physician recommends that you continue on your current medications as directed. Please refer to the Current Medication list given to you today.  Labwork: None ordered  Testing/Procedures: Your physician has recommended that you have a loop recorder inserted.   Arrive at [facility], Entrance "A" of [place] at 11:00 a.m.  Follow-Up: Your physician recommends that you schedule a follow-up appointment in: 7-10 days, after your procedure on 12/28/16, with device clinic for a wound check.  If you need a refill on your cardiac medications before your next appointment, please call your pharmacy.

## 2016-12-07 NOTE — Progress Notes (Signed)
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 The patient is a 66 yo woman who developed dizziness and was found on workup to have sustained a right brain stroke. She has recently had problems with flu like symptoms. No other complaints. She has occaisional palpitations and her mother had atrial fib and her father had a cardiomyopathy.  No Known Allergies   Current Outpatient Prescriptions  Medication Sig Dispense Refill  . aspirin 325 MG tablet Take 1 tablet (325 mg total) by mouth daily.    Marland Kitchen atorvastatin (LIPITOR) 80 MG tablet Take 1 tablet (80 mg total) by mouth at bedtime. 30 tablet 0  . buPROPion (WELLBUTRIN XL) 300 MG 24 hr tablet Take 300 mg by mouth daily.     Marland Kitchen GARCINIA CAMBOGIA-CHROMIUM PO Take 2 tablets by mouth daily.    Marland Kitchen ibuprofen (ADVIL,MOTRIN) 200 MG tablet Take 800 mg by mouth every 6 (six) hours as needed for moderate pain.    Marland Kitchen lamoTRIgine (LAMICTAL) 100 MG tablet Take 100 mg by mouth daily.     Marland Kitchen levothyroxine (SYNTHROID, LEVOTHROID) 100 MCG tablet Take 100 mcg by mouth daily before breakfast.     . loratadine (CLARITIN) 10 MG tablet Take 10 mg by mouth daily as needed for allergies.     Marland Kitchen LORazepam (ATIVAN) 0.5 MG tablet Take 0.5 mg by mouth every 6 (six) hours as needed for anxiety.     . metoprolol succinate (TOPROL-XL) 25 MG 24 hr tablet Take 25 mg by mouth daily.     Marland Kitchen omeprazole (PRILOSEC) 40 MG capsule Take 40 mg by mouth daily.     Marland Kitchen OVER THE COUNTER MEDICATION Take 1 capsule by mouth daily. Med Name: [place]    . Prenatal Vit-Fe Fumarate-FA (PRENATAL MULTIVITAMIN) TABS tablet Take 1 tablet by mouth daily at 12 noon.    . RESTASIS 0.05 % ophthalmic emulsion Place 1 drop into both eyes 2 (two) times daily.     . sertraline (ZOLOFT) 100 MG tablet Take 100 mg by mouth daily.     Marland Kitchen spironolactone (ALDACTONE) 25 MG tablet Take 25 mg by mouth daily.     . traZODone (DESYREL) 150 MG tablet Take 75 mg by mouth at  bedtime.     . vitamin B-12 (CYANOCOBALAMIN) 100 MCG tablet Take 100 mcg by mouth at bedtime.     No current facility-administered medications for this visit.      Past Medical History:  Diagnosis Date  . Depression   . GERD (gastroesophageal reflux disease)   . Hypercholesterolemia   . Hypertension   . Hypothyroidism   . IBS (irritable bowel syndrome)   . Stroke ([place])   . Thyroid disease     ROS:   All systems reviewed and negative except as noted in the HPI.   Past Surgical History:  Procedure Laterality Date  . ABDOMINAL HYSTERECTOMY    . APPENDECTOMY    . CESAREAN SECTION    . CHOLECYSTECTOMY    . THYROIDECTOMY    . TRIGGER FINGER RELEASE       Family History  Problem Relation Age of Onset  . Dementia Mother   . Hypercholesterolemia Mother   . Heart disease Father   . Stroke Father   . Heart disease Brother   . Congestive Heart Failure Sister      Social History   Social History  . Marital status: Unknown    Spouse name: N/A  .  Number of children: N/A  . Years of education: N/A   Occupational History  . Not on file.   Social History Main Topics  . Smoking status: Never Smoker  . Smokeless tobacco: Never Used  . Alcohol use No  . Drug use: No  . Sexual activity: Not on file   Other Topics Concern  . Not on file   Social History Narrative  . No narrative on file     BP 112/70   Pulse 72   Ht 5\' 3"  (1.6 m)   Wt 222 lb (100.7 kg)   SpO2 94%   BMI 39.33 kg/m   Physical Exam:  Well appearing NAD HEENT: Unremarkable Neck:  No JVD, no thyromegally Lymphatics:  No adenopathy Back:  No CVA tenderness Lungs:  Clear HEART:  Regular rate rhythm, no murmurs, no rubs, no clicks Abd:  soft, positive bowel sounds, no organomegally, no rebound, no guarding Ext:  2 plus pulses, no edema, no cyanosis, no clubbing Skin:  No rashes no nodules Neuro:  CN II through XII intact, motor grossly intact  Assess/Plan: 1. Cryptogenic stroke - we  discussed the treatment options and she is willing to undergo insertion of an ILR. 2. Obesity - she will need to work on losing weight. 3. HTN heart disease - her blood pressure is controlled. She will continue her current meds.

## 2016-12-11 DIAGNOSIS — F419 Anxiety disorder, unspecified: Secondary | ICD-10-CM | POA: Diagnosis not present

## 2016-12-11 DIAGNOSIS — F339 Major depressive disorder, recurrent, unspecified: Secondary | ICD-10-CM | POA: Diagnosis not present

## 2016-12-11 DIAGNOSIS — M545 Low back pain: Secondary | ICD-10-CM | POA: Diagnosis not present

## 2016-12-11 DIAGNOSIS — E7800 Pure hypercholesterolemia, unspecified: Secondary | ICD-10-CM | POA: Diagnosis not present

## 2016-12-11 DIAGNOSIS — J069 Acute upper respiratory infection, unspecified: Secondary | ICD-10-CM | POA: Diagnosis not present

## 2016-12-11 DIAGNOSIS — G4700 Insomnia, unspecified: Secondary | ICD-10-CM | POA: Diagnosis not present

## 2016-12-11 DIAGNOSIS — I10 Essential (primary) hypertension: Secondary | ICD-10-CM | POA: Diagnosis not present

## 2016-12-11 DIAGNOSIS — E890 Postprocedural hypothyroidism: Secondary | ICD-10-CM | POA: Diagnosis not present

## 2016-12-11 DIAGNOSIS — K219 Gastro-esophageal reflux disease without esophagitis: Secondary | ICD-10-CM | POA: Diagnosis not present

## 2016-12-13 ENCOUNTER — Encounter: Payer: Self-pay | Admitting: Interventional Cardiology

## 2016-12-13 NOTE — Telephone Encounter (Signed)
Void  

## 2016-12-20 DIAGNOSIS — H2513 Age-related nuclear cataract, bilateral: Secondary | ICD-10-CM | POA: Diagnosis not present

## 2016-12-20 DIAGNOSIS — H04123 Dry eye syndrome of bilateral lacrimal glands: Secondary | ICD-10-CM | POA: Diagnosis not present

## 2016-12-28 ENCOUNTER — Encounter (HOSPITAL_COMMUNITY)
Admission: RE | Disposition: A | Discharge status: Home / Self Care | Payer: Self-pay | Source: Ambulatory Visit | Attending: Internal Medicine

## 2016-12-28 ENCOUNTER — Encounter (HOSPITAL_COMMUNITY): Payer: Self-pay | Admitting: *Deleted

## 2016-12-28 ENCOUNTER — Ambulatory Visit (HOSPITAL_COMMUNITY)
Admission: RE | Admit: 2016-12-28 | Discharge: 2016-12-28 | Disposition: A | Discharge status: Home / Self Care | Payer: Medicare Other | Source: Ambulatory Visit | Attending: Internal Medicine | Admitting: Internal Medicine

## 2016-12-28 DIAGNOSIS — Z8249 Family history of ischemic heart disease and other diseases of the circulatory system: Secondary | ICD-10-CM | POA: Diagnosis not present

## 2016-12-28 DIAGNOSIS — Z7982 Long term (current) use of aspirin: Secondary | ICD-10-CM | POA: Diagnosis not present

## 2016-12-28 DIAGNOSIS — E669 Obesity, unspecified: Secondary | ICD-10-CM | POA: Insufficient documentation

## 2016-12-28 DIAGNOSIS — E039 Hypothyroidism, unspecified: Secondary | ICD-10-CM | POA: Insufficient documentation

## 2016-12-28 DIAGNOSIS — I638 Other cerebral infarction: Secondary | ICD-10-CM | POA: Diagnosis not present

## 2016-12-28 DIAGNOSIS — F329 Major depressive disorder, single episode, unspecified: Secondary | ICD-10-CM | POA: Insufficient documentation

## 2016-12-28 DIAGNOSIS — K219 Gastro-esophageal reflux disease without esophagitis: Secondary | ICD-10-CM | POA: Insufficient documentation

## 2016-12-28 DIAGNOSIS — K589 Irritable bowel syndrome without diarrhea: Secondary | ICD-10-CM | POA: Diagnosis not present

## 2016-12-28 DIAGNOSIS — Z6839 Body mass index (BMI) 39.0-39.9, adult: Secondary | ICD-10-CM | POA: Diagnosis not present

## 2016-12-28 DIAGNOSIS — E7800 Pure hypercholesterolemia, unspecified: Secondary | ICD-10-CM | POA: Insufficient documentation

## 2016-12-28 DIAGNOSIS — I10 Essential (primary) hypertension: Secondary | ICD-10-CM | POA: Insufficient documentation

## 2016-12-28 DIAGNOSIS — Z823 Family history of stroke: Secondary | ICD-10-CM | POA: Diagnosis not present

## 2016-12-28 DIAGNOSIS — I639 Cerebral infarction, unspecified: Secondary | ICD-10-CM | POA: Diagnosis not present

## 2016-12-28 HISTORY — PX: EP IMPLANTABLE DEVICE: SHX172B

## 2016-12-28 SURGERY — LOOP RECORDER INSERTION

## 2016-12-28 MED ORDER — LIDOCAINE-EPINEPHRINE (PF) 1.5 %-1:200000 IJ SOLN
30.0000 mL | Freq: Once | INTRAMUSCULAR | Status: AC
  Administered 2016-12-28: 30 mL
  Filled 2016-12-28: qty 30

## 2016-12-28 SURGICAL SUPPLY — 2 items
LOOP REVEAL LINQSYS (Prosthesis & Implant Heart) ×2 IMPLANT
PACK LOOP INSERTION (CUSTOM PROCEDURE TRAY) ×2 IMPLANT

## 2016-12-28 NOTE — H&P (View-Only) (Signed)
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 The patient is a 66 yo woman who developed dizziness and was found on workup to have sustained a right brain stroke. She has recently had problems with flu like symptoms. No other complaints. She has occaisional palpitations and her mother had atrial fib and her father had a cardiomyopathy.  No Known Allergies   Current Outpatient Prescriptions  Medication Sig Dispense Refill  . aspirin 325 MG tablet Take 1 tablet (325 mg total) by mouth daily.    Marland Kitchen atorvastatin (LIPITOR) 80 MG tablet Take 1 tablet (80 mg total) by mouth at bedtime. 30 tablet 0  . buPROPion (WELLBUTRIN XL) 300 MG 24 hr tablet Take 300 mg by mouth daily.     Marland Kitchen GARCINIA CAMBOGIA-CHROMIUM PO Take 2 tablets by mouth daily.    Marland Kitchen ibuprofen (ADVIL,MOTRIN) 200 MG tablet Take 800 mg by mouth every 6 (six) hours as needed for moderate pain.    Marland Kitchen lamoTRIgine (LAMICTAL) 100 MG tablet Take 100 mg by mouth daily.     Marland Kitchen levothyroxine (SYNTHROID, LEVOTHROID) 100 MCG tablet Take 100 mcg by mouth daily before breakfast.     . loratadine (CLARITIN) 10 MG tablet Take 10 mg by mouth daily as needed for allergies.     Marland Kitchen LORazepam (ATIVAN) 0.5 MG tablet Take 0.5 mg by mouth every 6 (six) hours as needed for anxiety.     . metoprolol succinate (TOPROL-XL) 25 MG 24 hr tablet Take 25 mg by mouth daily.     Marland Kitchen omeprazole (PRILOSEC) 40 MG capsule Take 40 mg by mouth daily.     Marland Kitchen OVER THE COUNTER MEDICATION Take 1 capsule by mouth daily. Med Name: [place]    . Prenatal Vit-Fe Fumarate-FA (PRENATAL MULTIVITAMIN) TABS tablet Take 1 tablet by mouth daily at 12 noon.    . RESTASIS 0.05 % ophthalmic emulsion Place 1 drop into both eyes 2 (two) times daily.     . sertraline (ZOLOFT) 100 MG tablet Take 100 mg by mouth daily.     Marland Kitchen spironolactone (ALDACTONE) 25 MG tablet Take 25 mg by mouth daily.     . traZODone (DESYREL) 150 MG tablet Take 75 mg by mouth at  bedtime.     . vitamin B-12 (CYANOCOBALAMIN) 100 MCG tablet Take 100 mcg by mouth at bedtime.     No current facility-administered medications for this visit.      Past Medical History:  Diagnosis Date  . Depression   . GERD (gastroesophageal reflux disease)   . Hypercholesterolemia   . Hypertension   . Hypothyroidism   . IBS (irritable bowel syndrome)   . Stroke ([place])   . Thyroid disease     ROS:   All systems reviewed and negative except as noted in the HPI.   Past Surgical History:  Procedure Laterality Date  . ABDOMINAL HYSTERECTOMY    . APPENDECTOMY    . CESAREAN SECTION    . CHOLECYSTECTOMY    . THYROIDECTOMY    . TRIGGER FINGER RELEASE       Family History  Problem Relation Age of Onset  . Dementia Mother   . Hypercholesterolemia Mother   . Heart disease Father   . Stroke Father   . Heart disease Brother   . Congestive Heart Failure Sister      Social History   Social History  . Marital status: Unknown    Spouse name: N/A  .  Number of children: N/A  . Years of education: N/A   Occupational History  . Not on file.   Social History Main Topics  . Smoking status: Never Smoker  . Smokeless tobacco: Never Used  . Alcohol use No  . Drug use: No  . Sexual activity: Not on file   Other Topics Concern  . Not on file   Social History Narrative  . No narrative on file     BP 112/70   Pulse 72   Ht 5\' 3"  (1.6 m)   Wt 222 lb (100.7 kg)   SpO2 94%   BMI 39.33 kg/m   Physical Exam:  Well appearing NAD HEENT: Unremarkable Neck:  No JVD, no thyromegally Lymphatics:  No adenopathy Back:  No CVA tenderness Lungs:  Clear HEART:  Regular rate rhythm, no murmurs, no rubs, no clicks Abd:  soft, positive bowel sounds, no organomegally, no rebound, no guarding Ext:  2 plus pulses, no edema, no cyanosis, no clubbing Skin:  No rashes no nodules Neuro:  CN II through XII intact, motor grossly intact  Assess/Plan: 1. Cryptogenic stroke - we  discussed the treatment options and she is willing to undergo insertion of an ILR. 2. Obesity - she will need to work on losing weight. 3. HTN heart disease - her blood pressure is controlled. She will continue her current meds.

## 2016-12-28 NOTE — Interval H&P Note (Signed)
History and Physical Interval Note:  12/28/2016 11:53 AM  April Armstrong  has presented today for surgery, with the diagnosis of stroke  The various methods of treatment have been discussed with the patient and family. After consideration of risks, benefits and other options for treatment, the patient has consented to  Procedure(s): Loop Recorder Insertion (N/A) as a surgical intervention .  The patient's history has been reviewed, patient examined, no change in status, stable for surgery.  I have reviewed the patient's chart and labs.  Questions were answered to the patient's satisfaction.     Cristopher Peru

## 2017-01-07 ENCOUNTER — Ambulatory Visit (INDEPENDENT_AMBULATORY_CARE_PROVIDER_SITE_OTHER): Payer: Medicare Other | Admitting: *Deleted

## 2017-01-07 DIAGNOSIS — I6340 Cerebral infarction due to embolism of unspecified cerebral artery: Secondary | ICD-10-CM

## 2017-01-07 NOTE — Progress Notes (Signed)
Wound check appointment. Steri-strips removed. Wound without redness or edema. Incision edges approximated, wound well healed. Loop check in clinic. Battery status: good. R-waves 0.38mV. 0 symptom episodes, 0 tachy episodes, 0 pause episodes, 0 brady episodes. 0 AF episodes. Monthly summary reports and ROV with GT PRN

## 2017-01-23 ENCOUNTER — Telehealth (INDEPENDENT_AMBULATORY_CARE_PROVIDER_SITE_OTHER): Payer: Self-pay

## 2017-01-23 NOTE — Telephone Encounter (Signed)
Patient left message requesting an injection for her "sciatica on both sides." Had RFA in June 2017. Please advise

## 2017-01-24 NOTE — Telephone Encounter (Signed)
Ov first?

## 2017-01-24 NOTE — Telephone Encounter (Signed)
Pt scheduled for ov tomorrow @ 10:30

## 2017-01-25 ENCOUNTER — Encounter (INDEPENDENT_AMBULATORY_CARE_PROVIDER_SITE_OTHER): Payer: Self-pay | Admitting: Physical Medicine and Rehabilitation

## 2017-01-25 ENCOUNTER — Ambulatory Visit (INDEPENDENT_AMBULATORY_CARE_PROVIDER_SITE_OTHER): Payer: Medicare Other | Admitting: Physical Medicine and Rehabilitation

## 2017-01-25 VITALS — BP 116/65 | HR 65

## 2017-01-25 DIAGNOSIS — I6340 Cerebral infarction due to embolism of unspecified cerebral artery: Secondary | ICD-10-CM | POA: Diagnosis not present

## 2017-01-25 DIAGNOSIS — G8929 Other chronic pain: Secondary | ICD-10-CM | POA: Diagnosis not present

## 2017-01-25 DIAGNOSIS — M47816 Spondylosis without myelopathy or radiculopathy, lumbar region: Secondary | ICD-10-CM | POA: Diagnosis not present

## 2017-01-25 DIAGNOSIS — M545 Low back pain, unspecified: Secondary | ICD-10-CM

## 2017-01-25 NOTE — Progress Notes (Signed)
April Armstrong - 66 y.o. female MRN [id number]  Date of birth: [birth year]  Office Visit Note: Visit Date: 01/25/2017 PCP: Donnie Coffin, MD Referred by: Alroy Dust, L.Marlou Sa, MD  Subjective: Chief Complaint  Patient presents with  . Lower Back - Pain  . Spine - Pain   HPI: April Armstrong is a 66 year old female who we've seen over the last year with mostly axial low back pain which was felt to be facet mediated low back pain and did respond to facet joint blocks as well as radiofrequency ablation. The ablation was performed last year about 6-7 months ago. She reports that that has actually been doing well but it's really been more of her buttock pain recently that has been giving her more pain. His left more than right. It feels like it is deep in the pelvic buttock region. She does point to the SI joint. Prior sacroiliac joint was not effective in helping her last year. She says walking makes it worse as well as standing to wash the dishes. She says sitting on a heating pad seems to help. She reports this pain as "debilitating". She does wear back brace at times that helps a little bit. Medications have been ineffective. She has had physical therapy in the past but not specifically for the buttock pain. She has no groin pain or radicular pain down the legs. MRI of the lumbar spine was reviewed and again shows significant facet arthropathy which is moderate at L4-5 with a grade 1 listhesis which is minimal. There is no central canal stenosis. There are no herniated disc or nerve compression.    Bilateral buttock pain- left worse than right. Has been having this for several years. RFA helped lower back pain but not the buttock pain. Walking makes it worse, standing to wash dishes. Sitting on heating pad helps. Says pain is "debilitating." Wears a back brace. Review of Systems  Constitutional: Negative for chills, fever, malaise/fatigue and weight loss.  HENT: Negative for hearing loss and sinus pain.    Eyes: Negative for blurred vision, double vision and photophobia.  Respiratory: Negative for cough and shortness of breath.   Cardiovascular: Negative for chest pain, palpitations and leg swelling.  Gastrointestinal: Negative for abdominal pain, nausea and vomiting.  Genitourinary: Negative for flank pain.  Musculoskeletal: Positive for back pain. Negative for myalgias.  Skin: Negative for itching and rash.  Neurological: Negative for tremors, focal weakness and weakness.  Endo/Heme/Allergies: Negative.   Psychiatric/Behavioral: Negative for depression.  All other systems reviewed and are negative.  Otherwise per HPI.  Assessment & Plan: Visit Diagnoses:  1. Spondylosis without myelopathy or radiculopathy, lumbar region   2. Chronic left-sided low back pain without sciatica     Plan: Findings:  Chronic worsening severe left more than right buttock pain which is likely irritation of the nerve root at the L4-5 region with her is a very small listhesis. I feel this way because of the nature of the way she complains of it and when it occurs. It could be sacroiliac joint pain and a prior sacroiliac joint injection was not effective although that doesn't rule out out. She could also have something muscular such as piriformis her gluteus medius tendinitis. I think the best approach is diagnostic of therapeutic left L5 transforaminal epidural steroid injection. We'll get that scheduled.    Meds & Orders: No orders of the defined types were placed in this encounter.  No orders of the defined types were placed in this encounter.  Follow-up: Return for Left L5 transforaminal epidural steroid injection.   Procedures: No procedures performed  No notes on file   Clinical History: No specialty comments available.  She reports that she has never smoked. She has never used smokeless tobacco.   Recent Labs  04/13/16 0512  HGBA1C 6.2*    Objective:  VS:  HT:    WT:   BMI:     BP:116/65   HR:65bpm  TEMP: ( )  RESP:  Physical Exam  Constitutional: She is oriented to person, place, and time. She appears well-developed and well-nourished.  Eyes: Conjunctivae and EOM are normal. Pupils are equal, round, and reactive to light.  Cardiovascular: Normal rate and intact distal pulses.   Pulmonary/Chest: Effort normal.  Musculoskeletal:  Patient has concordant low back pain with extension rotation. She does have a positive Fortin finger test on the left and right. She has some pain over the PSIS but not over the greater trochanter. No pain with hip rotation. Good distal strength.  Neurological: She is alert and oriented to person, place, and time.  Skin: Skin is warm and dry. No rash noted. No erythema.  Psychiatric: She has a normal mood and affect. Her behavior is normal.  Nursing note and vitals reviewed.   Ortho Exam Imaging: No results found.  Past Medical/Family/Surgical/Social History: Medications & Allergies reviewed per EMR Patient Active Problem List   Diagnosis Date Noted  . BPPV (benign paroxysmal positional vertigo) 10/22/2016  . Anxiety state 06/22/2016  . Tachycardia 04/25/2016  . Bilateral occipital neuralgia   . Cryptogenic stroke ([place]) 04/13/2016  . Cerebral infarction ([place])   . Thyroid activity decreased   . Depression   . Hypothyroidism   . GERD (gastroesophageal reflux disease)   . Hyperlipidemia   . Hypertension   . IBS (irritable bowel syndrome)    Past Medical History:  Diagnosis Date  . Depression   . GERD (gastroesophageal reflux disease)   . Hypercholesterolemia   . Hypertension   . Hypothyroidism   . IBS (irritable bowel syndrome)   . Stroke ([place])   . Thyroid disease    Family History  Problem Relation Age of Onset  . Dementia Mother   . Hypercholesterolemia Mother   . Heart disease Father   . Stroke Father   . Heart disease Brother   . Congestive Heart Failure Sister    Past Surgical History:  Procedure Laterality Date  .  ABDOMINAL HYSTERECTOMY    . APPENDECTOMY    . CESAREAN SECTION    . CHOLECYSTECTOMY    . EP IMPLANTABLE DEVICE N/A 12/28/2016   Procedure: Loop Recorder Insertion;  Surgeon: Evans Lance, MD;  Location: [facility];  Service: Cardiovascular;  Laterality: N/A;  . THYROIDECTOMY    . TRIGGER FINGER RELEASE     Social History   Occupational History  . Not on file.   Social History Main Topics  . Smoking status: Never Smoker  . Smokeless tobacco: Never Used  . Alcohol use No  . Drug use: No  . Sexual activity: Not on file

## 2017-01-28 ENCOUNTER — Ambulatory Visit (INDEPENDENT_AMBULATORY_CARE_PROVIDER_SITE_OTHER): Payer: Medicare Other | Admitting: *Deleted

## 2017-01-28 DIAGNOSIS — I6340 Cerebral infarction due to embolism of unspecified cerebral artery: Secondary | ICD-10-CM | POA: Diagnosis not present

## 2017-01-28 NOTE — Progress Notes (Signed)
[facility] Summary Report / Loop Recorder 

## 2017-02-05 ENCOUNTER — Encounter (INDEPENDENT_AMBULATORY_CARE_PROVIDER_SITE_OTHER): Payer: Self-pay | Admitting: Physical Medicine and Rehabilitation

## 2017-02-05 ENCOUNTER — Ambulatory Visit (INDEPENDENT_AMBULATORY_CARE_PROVIDER_SITE_OTHER): Payer: Medicare Other

## 2017-02-05 ENCOUNTER — Ambulatory Visit (INDEPENDENT_AMBULATORY_CARE_PROVIDER_SITE_OTHER): Payer: Medicare Other | Admitting: Physical Medicine and Rehabilitation

## 2017-02-05 VITALS — BP 121/62 | HR 79 | Temp 98.2°F

## 2017-02-05 DIAGNOSIS — M5416 Radiculopathy, lumbar region: Secondary | ICD-10-CM | POA: Diagnosis not present

## 2017-02-05 MED ORDER — METHYLPREDNISOLONE ACETATE 80 MG/ML IJ SUSP
80.0000 mg | Freq: Once | INTRAMUSCULAR | Status: AC
  Administered 2017-02-05: 80 mg

## 2017-02-05 MED ORDER — LIDOCAINE HCL (PF) 1 % IJ SOLN
0.3300 mL | Freq: Once | INTRAMUSCULAR | Status: AC
  Administered 2017-02-05: 0.3 mL

## 2017-02-05 NOTE — Procedures (Signed)
Lumbosacral Transforaminal Epidural Steroid Injection - Infraneural Approach with Fluoroscopic Guidance  Patient: April Armstrong      Date of Birth: 12/22/1950 MRN: [id number] PCP: Donnie Coffin, MD      Visit Date: 02/05/2017   Universal Protocol:    Date/Time: 03/13/182:08 PM  Consent Given By: the patient  Position: PRONE   Additional Comments: Vital signs were monitored before and after the procedure. Patient was prepped and draped in the usual sterile fashion. The correct patient, procedure, and site was verified.   Injection Procedure Details:  Procedure Site One Meds Administered:  Meds ordered this encounter  Medications  . lidocaine (PF) (XYLOCAINE) 1 % injection 0.3 mL  . methylPREDNISolone acetate (DEPO-MEDROL) injection 80 mg      Laterality: Left  Location/Site:  L5-S1  Needle size: 22 G  Needle type: Spinal  Needle Placement: Transforaminal  Findings:  -Contrast Used: 1 mL iohexol 180 mg iodine/mL   -Comments: Excellent flow of contrast along the nerve and into the epidural space.  Procedure Details: After squaring off the end-plates of the desired vertebral level to get a true AP view, the C-arm was obliqued to the painful side so that the superior articulating process is positioned about 1/3 the length of the inferior endplate.  The needle was aimed toward the junction of the superior articular process and the transverse process of the inferior vertebrae. The needle's initial entry is in the lower third of the foramen through Kambin's triangle. The soft tissues overlying this target were infiltrated with 2-3 ml. of 1% Lidocaine without Epinephrine.  The spinal needle was then inserted and advanced toward the target using a "trajectory" view along the fluoroscope beam.  Under AP and lateral visualization, the needle was advanced so it did not puncture dura and did not traverse medially beyond the 6 o'clock position of the pedicle. Bi-planar projections  were used to confirm position. Aspiration was confirmed to be negative for CSF and/or blood. A 1-2 ml. volume of Isovue-250 was injected and flow of contrast was noted at each level. Radiographs were obtained for documentation purposes.   After attaining the desired flow of contrast documented above, a 0.5 to 1.0 ml test dose of 0.25% Marcaine was injected into each respective transforaminal space.  The patient was observed for 90 seconds post injection.  After no sensory deficits were reported, and normal lower extremity motor function was noted,   the above injectate was administered so that equal amounts of the injectate were placed at each foramen (level) into the transforaminal epidural space.   Additional Comments:  The patient tolerated the procedure well Dressing: Band-Aid    Post-procedure details: Patient was observed during the procedure. Post-procedure instructions were reviewed.  Patient left the clinic in stable condition.

## 2017-02-05 NOTE — Patient Instructions (Signed)

## 2017-02-05 NOTE — Progress Notes (Signed)
April Armstrong - 66 y.o. female MRN [id number]  Date of birth: 10-02-1951  Office Visit Note: Visit Date: 02/05/2017 PCP: Donnie Coffin, MD Referred by: Alroy Dust, L.Marlou Sa, MD  Subjective: Chief Complaint  Patient presents with  . Lower Back - Pain   HPI: April Armstrong is a 66 year old female who is here today for planned left L5 transforaminal injection. No change in symptoms. She has 75% pain on the left side with some on the right side. Her pain is debilitating to her and seems to be out of proportion with the physical exam findings as well as imaging. This is going to be a diagnostic injection. Please see our last note for further evaluation and management details.    ROS Otherwise per HPI.  Assessment & Plan: Visit Diagnoses:  1. Lumbar radiculopathy     Plan: Findings:  Left L5 transforaminal epidural steroid injection.    Meds & Orders:  Meds ordered this encounter  Medications  . lidocaine (PF) (XYLOCAINE) 1 % injection 0.3 mL  . methylPREDNISolone acetate (DEPO-MEDROL) injection 80 mg    Orders Placed This Encounter  Procedures  . XR C-ARM NO REPORT  . Epidural Steroid injection    Follow-up: Return if symptoms worsen or fail to improve, 2 weeks.   Procedures: No procedures performed  Lumbosacral Transforaminal Epidural Steroid Injection - Infraneural Approach with Fluoroscopic Guidance  Patient: April Armstrong      Date of Birth: [birth year] MRN: [id number] PCP: Donnie Coffin, MD      Visit Date: 02/05/2017   Universal Protocol:    Date/Time: 03/13/182:08 PM  Consent Given By: the patient  Position: PRONE   Additional Comments: Vital signs were monitored before and after the procedure. Patient was prepped and draped in the usual sterile fashion. The correct patient, procedure, and site was verified.   Injection Procedure Details:  Procedure Site One Meds Administered:  Meds ordered this encounter  Medications  . lidocaine (PF) (XYLOCAINE) 1 %  injection 0.3 mL  . methylPREDNISolone acetate (DEPO-MEDROL) injection 80 mg      Laterality: Left  Location/Site:  L5-S1  Needle size: 22 G  Needle type: Spinal  Needle Placement: Transforaminal  Findings:  -Contrast Used: 1 mL iohexol 180 mg iodine/mL   -Comments: Excellent flow of contrast along the nerve and into the epidural space.  Procedure Details: After squaring off the end-plates of the desired vertebral level to get a true AP view, the C-arm was obliqued to the painful side so that the superior articulating process is positioned about 1/3 the length of the inferior endplate.  The needle was aimed toward the junction of the superior articular process and the transverse process of the inferior vertebrae. The needle's initial entry is in the lower third of the foramen through Kambin's triangle. The soft tissues overlying this target were infiltrated with 2-3 ml. of 1% Lidocaine without Epinephrine.  The spinal needle was then inserted and advanced toward the target using a "trajectory" view along the fluoroscope beam.  Under AP and lateral visualization, the needle was advanced so it did not puncture dura and did not traverse medially beyond the 6 o'clock position of the pedicle. Bi-planar projections were used to confirm position. Aspiration was confirmed to be negative for CSF and/or blood. A 1-2 ml. volume of Isovue-250 was injected and flow of contrast was noted at each level. Radiographs were obtained for documentation purposes.   After attaining the desired flow of contrast documented above, a 0.5 to 1.0 ml test  dose of 0.25% Marcaine was injected into each respective transforaminal space.  The patient was observed for 90 seconds post injection.  After no sensory deficits were reported, and normal lower extremity motor function was noted,   the above injectate was administered so that equal amounts of the injectate were placed at each foramen (level) into the transforaminal  epidural space.   Additional Comments:  The patient tolerated the procedure well Dressing: Band-Aid    Post-procedure details: Patient was observed during the procedure. Post-procedure instructions were reviewed.  Patient left the clinic in stable condition.   Clinical History: No specialty comments available.  She reports that she has never smoked. She has never used smokeless tobacco.   Recent Labs  04/13/16 0512  HGBA1C 6.2*    Objective:  VS:  HT:    WT:   BMI:     BP:121/62  HR:79bpm  TEMP:98.2 F (36.8 C)( )  RESP:95 % Physical Exam  Musculoskeletal:  Patient ambulates without aid with good distal strength.    Ortho Exam Imaging: Xr C-arm No Report  Result Date: 02/05/2017 Please see Notes or Procedures tab for imaging impression.   Past Medical/Family/Surgical/Social History: Medications & Allergies reviewed per EMR Patient Active Problem List   Diagnosis Date Noted  . BPPV (benign paroxysmal positional vertigo) 10/22/2016  . Anxiety state 06/22/2016  . Tachycardia 04/25/2016  . Bilateral occipital neuralgia   . Cryptogenic stroke ([place]) 04/13/2016  . Cerebral infarction ([place])   . Thyroid activity decreased   . Depression   . Hypothyroidism   . GERD (gastroesophageal reflux disease)   . Hyperlipidemia   . Hypertension   . IBS (irritable bowel syndrome)    Past Medical History:  Diagnosis Date  . Depression   . GERD (gastroesophageal reflux disease)   . Hypercholesterolemia   . Hypertension   . Hypothyroidism   . IBS (irritable bowel syndrome)   . Stroke ([place])   . Thyroid disease    Family History  Problem Relation Age of Onset  . Dementia Mother   . Hypercholesterolemia Mother   . Heart disease Father   . Stroke Father   . Heart disease Brother   . Congestive Heart Failure Sister    Past Surgical History:  Procedure Laterality Date  . ABDOMINAL HYSTERECTOMY    . APPENDECTOMY    . CESAREAN SECTION    . CHOLECYSTECTOMY    . EP  IMPLANTABLE DEVICE N/A 12/28/2016   Procedure: Loop Recorder Insertion;  Surgeon: Evans Lance, MD;  Location: [facility];  Service: Cardiovascular;  Laterality: N/A;  . THYROIDECTOMY    . TRIGGER FINGER RELEASE     Social History   Occupational History  . Not on file.   Social History Main Topics  . Smoking status: Never Smoker  . Smokeless tobacco: Never Used  . Alcohol use No  . Drug use: No  . Sexual activity: Not on file

## 2017-02-11 ENCOUNTER — Telehealth (INDEPENDENT_AMBULATORY_CARE_PROVIDER_SITE_OTHER): Payer: Self-pay | Admitting: Physical Medicine and Rehabilitation

## 2017-02-11 NOTE — Telephone Encounter (Signed)
ok 

## 2017-02-12 ENCOUNTER — Other Ambulatory Visit: Payer: Self-pay | Admitting: Internal Medicine

## 2017-02-12 NOTE — Telephone Encounter (Signed)
Scheduled for 02/21/17

## 2017-02-14 LAB — CUP PACEART REMOTE DEVICE CHECK
Date Time Interrogation Session: 20180304163633
Implantable Pulse Generator Implant Date: 20180202

## 2017-02-21 ENCOUNTER — Ambulatory Visit (INDEPENDENT_AMBULATORY_CARE_PROVIDER_SITE_OTHER): Payer: Medicare Other | Admitting: Physical Medicine and Rehabilitation

## 2017-02-21 ENCOUNTER — Ambulatory Visit (INDEPENDENT_AMBULATORY_CARE_PROVIDER_SITE_OTHER): Payer: Self-pay

## 2017-02-21 ENCOUNTER — Encounter (INDEPENDENT_AMBULATORY_CARE_PROVIDER_SITE_OTHER): Payer: Self-pay | Admitting: Physical Medicine and Rehabilitation

## 2017-02-21 VITALS — BP 114/60 | HR 66 | Temp 98.1°F

## 2017-02-21 DIAGNOSIS — M5416 Radiculopathy, lumbar region: Secondary | ICD-10-CM

## 2017-02-21 MED ORDER — METHYLPREDNISOLONE ACETATE 80 MG/ML IJ SUSP
80.0000 mg | Freq: Once | INTRAMUSCULAR | Status: AC
  Administered 2017-02-21: 80 mg

## 2017-02-21 MED ORDER — LIDOCAINE HCL (PF) 1 % IJ SOLN
0.3300 mL | Freq: Once | INTRAMUSCULAR | Status: AC
  Administered 2017-02-21: 0.3 mL

## 2017-02-21 NOTE — Patient Instructions (Signed)

## 2017-02-21 NOTE — Progress Notes (Signed)
Rillie Riffel - 66 y.o. female MRN [id number]  Date of birth: [birth year]  Office Visit Note: Visit Date: 02/21/2017 PCP: Donnie Coffin, MD Referred by: Alroy Dust, L.Marlou Sa, MD  Subjective: Chief Complaint  Patient presents with  . Lower Back - Pain   HPI: Mrs. Prickett is a 66 year old female that was having bilateral radicular complaints left much more than right. We completed left L5 transforaminal injections with really good relief of her symptoms and still lasting. She feels like the injection was a miracle. She continues to have worsening Right side lower back pain into buttock. Denies leg pain. Pain with increased activity and doing household activities. Says left side is doing great. No pain and is able to get around better.    ROS Otherwise per HPI.  Assessment & Plan: Visit Diagnoses:  1. Lumbar radiculopathy     Plan: Findings:  Right L5 transforaminal epidural steroid injection.    Meds & Orders:  Meds ordered this encounter  Medications  . lidocaine (PF) (XYLOCAINE) 1 % injection 0.3 mL  . methylPREDNISolone acetate (DEPO-MEDROL) injection 80 mg    Orders Placed This Encounter  Procedures  . XR C-ARM NO REPORT  . Epidural Steroid injection    Follow-up: Return if symptoms worsen or fail to improve.   Procedures: No procedures performed  Lumbosacral Transforaminal Epidural Steroid Injection - Infraneural Approach with Fluoroscopic Guidance  Patient: Fredna Stricker      Date of Birth: 1951/02/27 MRN: [id number] PCP: Donnie Coffin, MD      Visit Date: 02/21/2017   Universal Protocol:    Date/Time: 03/30/187:13 AM  Consent Given By: the patient  Position: PRONE   Additional Comments: Vital signs were monitored before and after the procedure. Patient was prepped and draped in the usual sterile fashion. The correct patient, procedure, and site was verified.   Injection Procedure Details:  Procedure Site One Meds Administered:  Meds ordered this  encounter  Medications  . lidocaine (PF) (XYLOCAINE) 1 % injection 0.3 mL  . methylPREDNISolone acetate (DEPO-MEDROL) injection 80 mg      Laterality: Right  Location/Site:  L5-S1  Needle size: 22 G  Needle type: Spinal  Needle Placement: Transforaminal  Findings:  -Contrast Used: 1 mL iohexol 180 mg iodine/mL   -Comments: Excellent flow of contrast along the nerve and into the epidural space.  Procedure Details: After squaring off the end-plates of the desired vertebral level to get a true AP view, the C-arm was obliqued to the painful side so that the superior articulating process is positioned about 1/3 the length of the inferior endplate.  The needle was aimed toward the junction of the superior articular process and the transverse process of the inferior vertebrae. The needle's initial entry is in the lower third of the foramen through Kambin's triangle. The soft tissues overlying this target were infiltrated with 2-3 ml. of 1% Lidocaine without Epinephrine.  The spinal needle was then inserted and advanced toward the target using a "trajectory" view along the fluoroscope beam.  Under AP and lateral visualization, the needle was advanced so it did not puncture dura and did not traverse medially beyond the 6 o'clock position of the pedicle. Bi-planar projections were used to confirm position. Aspiration was confirmed to be negative for CSF and/or blood. A 1-2 ml. volume of Isovue-250 was injected and flow of contrast was noted at each level. Radiographs were obtained for documentation purposes.   After attaining the desired flow of contrast documented above, a 0.5 to 1.0  ml test dose of 0.25% Marcaine was injected into each respective transforaminal space.  The patient was observed for 90 seconds post injection.  After no sensory deficits were reported, and normal lower extremity motor function was noted,   the above injectate was administered so that equal amounts of the injectate were  placed at each foramen (level) into the transforaminal epidural space.   Additional Comments:  The patient tolerated the procedure well Dressing: Band-Aid    Post-procedure details: Patient was observed during the procedure. Post-procedure instructions were reviewed.  Patient left the clinic in stable condition.   Clinical History: No specialty comments available.  She reports that she has never smoked. She has never used smokeless tobacco.   Recent Labs  04/13/16 0512  HGBA1C 6.2*    Objective:  VS:  HT:    WT:   BMI:     BP:114/60  HR:66bpm  TEMP:98.1 F (36.7 C)( )  RESP:98 % Physical Exam  Musculoskeletal:  Patient ambulates without aid with a fairly normal gait. She has some pain with extension rotation of the lumbar spine. She has no pain with hip rotation. She has good strength.    Ortho Exam Imaging: Xr C-arm No Report  Result Date: 02/21/2017 Please see Notes or Procedures tab for imaging impression.   Past Medical/Family/Surgical/Social History: Medications & Allergies reviewed per EMR Patient Active Problem List   Diagnosis Date Noted  . BPPV (benign paroxysmal positional vertigo) 10/22/2016  . Anxiety state 06/22/2016  . Tachycardia 04/25/2016  . Bilateral occipital neuralgia   . Cryptogenic stroke ([place]) 04/13/2016  . Cerebral infarction ([place])   . Thyroid activity decreased   . Depression   . Hypothyroidism   . GERD (gastroesophageal reflux disease)   . Hyperlipidemia   . Hypertension   . IBS (irritable bowel syndrome)    Past Medical History:  Diagnosis Date  . Depression   . GERD (gastroesophageal reflux disease)   . Hypercholesterolemia   . Hypertension   . Hypothyroidism   . IBS (irritable bowel syndrome)   . Stroke ([place])   . Thyroid disease    Family History  Problem Relation Age of Onset  . Dementia Mother   . Hypercholesterolemia Mother   . Heart disease Father   . Stroke Father   . Heart disease Brother   . Congestive  Heart Failure Sister    Past Surgical History:  Procedure Laterality Date  . ABDOMINAL HYSTERECTOMY    . APPENDECTOMY    . CESAREAN SECTION    . CHOLECYSTECTOMY    . EP IMPLANTABLE DEVICE N/A 12/28/2016   Procedure: Loop Recorder Insertion;  Surgeon: Evans Lance, MD;  Location: [facility];  Service: Cardiovascular;  Laterality: N/A;  . THYROIDECTOMY    . TRIGGER FINGER RELEASE     Social History   Occupational History  . Not on file.   Social History Main Topics  . Smoking status: Never Smoker  . Smokeless tobacco: Never Used  . Alcohol use No  . Drug use: No  . Sexual activity: Not on file

## 2017-02-22 NOTE — Procedures (Signed)
Lumbosacral Transforaminal Epidural Steroid Injection - Infraneural Approach with Fluoroscopic Guidance  Patient: April Armstrong      Date of Birth: [birth year] MRN: [id number] PCP: Donnie Coffin, MD      Visit Date: 02/21/2017   Universal Protocol:    Date/Time: 03/30/187:13 AM  Consent Given By: the patient  Position: PRONE   Additional Comments: Vital signs were monitored before and after the procedure. Patient was prepped and draped in the usual sterile fashion. The correct patient, procedure, and site was verified.   Injection Procedure Details:  Procedure Site One Meds Administered:  Meds ordered this encounter  Medications  . lidocaine (PF) (XYLOCAINE) 1 % injection 0.3 mL  . methylPREDNISolone acetate (DEPO-MEDROL) injection 80 mg      Laterality: Right  Location/Site:  L5-S1  Needle size: 22 G  Needle type: Spinal  Needle Placement: Transforaminal  Findings:  -Contrast Used: 1 mL iohexol 180 mg iodine/mL   -Comments: Excellent flow of contrast along the nerve and into the epidural space.  Procedure Details: After squaring off the end-plates of the desired vertebral level to get a true AP view, the C-arm was obliqued to the painful side so that the superior articulating process is positioned about 1/3 the length of the inferior endplate.  The needle was aimed toward the junction of the superior articular process and the transverse process of the inferior vertebrae. The needle's initial entry is in the lower third of the foramen through Kambin's triangle. The soft tissues overlying this target were infiltrated with 2-3 ml. of 1% Lidocaine without Epinephrine.  The spinal needle was then inserted and advanced toward the target using a "trajectory" view along the fluoroscope beam.  Under AP and lateral visualization, the needle was advanced so it did not puncture dura and did not traverse medially beyond the 6 o'clock position of the pedicle. Bi-planar projections  were used to confirm position. Aspiration was confirmed to be negative for CSF and/or blood. A 1-2 ml. volume of Isovue-250 was injected and flow of contrast was noted at each level. Radiographs were obtained for documentation purposes.   After attaining the desired flow of contrast documented above, a 0.5 to 1.0 ml test dose of 0.25% Marcaine was injected into each respective transforaminal space.  The patient was observed for 90 seconds post injection.  After no sensory deficits were reported, and normal lower extremity motor function was noted,   the above injectate was administered so that equal amounts of the injectate were placed at each foramen (level) into the transforaminal epidural space.   Additional Comments:  The patient tolerated the procedure well Dressing: Band-Aid    Post-procedure details: Patient was observed during the procedure. Post-procedure instructions were reviewed.  Patient left the clinic in stable condition.

## 2017-02-25 DIAGNOSIS — E890 Postprocedural hypothyroidism: Secondary | ICD-10-CM | POA: Diagnosis not present

## 2017-02-26 ENCOUNTER — Ambulatory Visit (INDEPENDENT_AMBULATORY_CARE_PROVIDER_SITE_OTHER): Payer: Medicare Other | Admitting: *Deleted

## 2017-02-26 DIAGNOSIS — I6340 Cerebral infarction due to embolism of unspecified cerebral artery: Secondary | ICD-10-CM | POA: Diagnosis not present

## 2017-02-26 NOTE — Progress Notes (Signed)
[facility] Summary Report / Loop Recorder 

## 2017-02-28 LAB — CUP PACEART REMOTE DEVICE CHECK
MDC IDC PG IMPLANT DT: 20180202
MDC IDC SESS DTM: 20180403163750

## 2017-03-11 ENCOUNTER — Telehealth (INDEPENDENT_AMBULATORY_CARE_PROVIDER_SITE_OTHER): Payer: Self-pay | Admitting: Physical Medicine and Rehabilitation

## 2017-03-11 MED ORDER — ACETAMINOPHEN-CODEINE #3 300-30 MG PO TABS: 1.0000 | ORAL_TABLET | Freq: Three times a day (TID) | ORAL | 0 refills | Status: DC | PRN

## 2017-03-11 NOTE — Telephone Encounter (Signed)
Called patient and left a message for her to call back to schedule an OV.

## 2017-03-11 NOTE — Telephone Encounter (Signed)
OV, difficult

## 2017-03-11 NOTE — Telephone Encounter (Signed)
Tylenol #3 on your desk to fax/call in

## 2017-03-12 NOTE — Telephone Encounter (Signed)
Prescription faxed and patient notified.

## 2017-03-18 DIAGNOSIS — K580 Irritable bowel syndrome with diarrhea: Secondary | ICD-10-CM | POA: Diagnosis not present

## 2017-03-21 ENCOUNTER — Ambulatory Visit (INDEPENDENT_AMBULATORY_CARE_PROVIDER_SITE_OTHER): Payer: Medicare Other | Admitting: Physical Medicine and Rehabilitation

## 2017-03-21 ENCOUNTER — Encounter (INDEPENDENT_AMBULATORY_CARE_PROVIDER_SITE_OTHER): Payer: Self-pay | Admitting: Physical Medicine and Rehabilitation

## 2017-03-21 ENCOUNTER — Encounter (INDEPENDENT_AMBULATORY_CARE_PROVIDER_SITE_OTHER): Payer: Self-pay

## 2017-03-21 VITALS — BP 110/69 | HR 65

## 2017-03-21 DIAGNOSIS — I6340 Cerebral infarction due to embolism of unspecified cerebral artery: Secondary | ICD-10-CM | POA: Diagnosis not present

## 2017-03-21 DIAGNOSIS — M47816 Spondylosis without myelopathy or radiculopathy, lumbar region: Secondary | ICD-10-CM

## 2017-03-21 DIAGNOSIS — M25552 Pain in left hip: Secondary | ICD-10-CM | POA: Diagnosis not present

## 2017-03-21 DIAGNOSIS — M9904 Segmental and somatic dysfunction of sacral region: Secondary | ICD-10-CM | POA: Diagnosis not present

## 2017-03-21 DIAGNOSIS — M5416 Radiculopathy, lumbar region: Secondary | ICD-10-CM

## 2017-03-21 NOTE — Progress Notes (Deleted)
Pain on left side of lower back, into left buttock, and radiating down left leg. Can still feel some pain on the right side, but the left is much worse. Hurts at night when trying to go to sleep. Also hurts with sitting. The Tylenol #3 eases the pain, but does not take it away. No numbness or tingling.

## 2017-03-22 ENCOUNTER — Encounter (INDEPENDENT_AMBULATORY_CARE_PROVIDER_SITE_OTHER): Payer: Self-pay | Admitting: Physical Medicine and Rehabilitation

## 2017-03-22 NOTE — Progress Notes (Signed)
April Armstrong - 66 y.o. female MRN [id number]  Date of birth: [birth year]  Office Visit Note: Visit Date: 03/21/2017 PCP: Donnie Coffin, MD Referred by: Alroy Dust, L.Marlou Sa, MD  Subjective: Chief Complaint  Patient presents with  . Lower Back - Pain   HPI: April Armstrong is a 66 year old female with history of chronic mostly axial low back pain but now most recently with pain radiating into the buttock region and posterior hamstring. She is a very pleasant lady but somewhat of a difficult patient in the sense that she just wants her pain to be better and even when we tried to diagnostically figure this out is seems like everything helps to some degree and does well but short lived. We completed radiofrequency ablation procedure over a year ago and that actually did fairly well for her back pain. She has MRI evidence of severe facet arthropathy at L4-5 without much in the way of central canal stenosis or frank nerve compression. We recently completed a left transforaminal injection at L5 which she said was wonderful and in her words it's never a percentage is just wonderful or completely pain-free but this only lasted about a month. We ended up doing the right side at her request even though her initial complaint when I've seen her most recently was never right-sided. She comes in today with left-sided complaints in the buttock and hamstring. It does go down to the calf at times. There is no paresthesias however is more of a deep centralized pain. The best way she can describe it is to the center of her leg. She does not carry a diagnosis of fibromyalgia. She is still reporting some pain into the right side but not nearly as bad as she felt like the last injection helped that as well. She denies any focal weakness or new trauma. She is failed conservative care in the past she has not had recent physical therapy but has had physical therapy and does maintain an exercise program at home. She is taking some  Tylenol 3 with some relief.    Review of Systems  Constitutional: Negative for chills, fever, malaise/fatigue and weight loss.  HENT: Negative for hearing loss and sinus pain.   Eyes: Negative for blurred vision, double vision and photophobia.  Respiratory: Negative for cough and shortness of breath.   Cardiovascular: Negative for chest pain, palpitations and leg swelling.  Gastrointestinal: Negative for abdominal pain, nausea and vomiting.  Genitourinary: Negative for flank pain.  Musculoskeletal: Positive for back pain and joint pain. Negative for myalgias.  Skin: Negative for itching and rash.  Neurological: Negative for tremors, focal weakness and weakness.  Endo/Heme/Allergies: Negative.   Psychiatric/Behavioral: Negative for depression.  All other systems reviewed and are negative.  Otherwise per HPI.  Assessment & Plan: Visit Diagnoses: No diagnosis found.  Plan: Findings:  Exacerbation of a chronic low back pain situation but now with more buttock pain that refers in the hamstring and leg without paresthesia. We reviewed a lot of images today and really focused on her symptoms at this point and I think this is more of a sacroiliac joint pain. She has pain right over the sacroiliac joint posteriorly. I think she has a mild bit of greater trochanteric bursitis so we could be looking in the gluteus medius tendinopathy as well. Epidural injection from a transforaminal approach did seem to help. She really doesn't help [place] diagnostically because a lot of the pain relief I think is somewhat placebo and somewhat real and she  gets almost 100% relief with just about anything we do. She does have severe facet arthropathy at L4-5 bilaterally and I do think her back pain that she gets when she stands and goes across the back is facet mediated. I do think we'll have to repeat the ablation at some point. The buttock pain is a little bit trickier and I think the first step would be a sacroiliac joint  diagnostically. If she gets a lot of relief with that but I would regroup with a physical therapist for manipulation as well as manual type treatment. If it doesn't help very much than the next step would be possibly repeating the transforaminal injection or maybe updating imaging of the lumbar spine. Her last MRI is fairly new though and there is nothing there that would cause an S1-type of a radiculitis or radiculopathy. For now her to start with the sacroiliac joint. She can continue her medications as usual. Will have further plans depending on that injection.    Meds & Orders: No orders of the defined types were placed in this encounter.  No orders of the defined types were placed in this encounter.   Follow-up: No Follow-up on file.   Procedures: No procedures performed  No notes on file   Clinical History: No specialty comments available.  She reports that she has never smoked. She has never used smokeless tobacco.   Recent Labs  04/13/16 0512  HGBA1C 6.2*    Objective:  VS:  HT:    WT:   BMI:     BP:110/69  HR:65bpm  TEMP: ( )  RESP:  Physical Exam  Constitutional: She is oriented to person, place, and time. She appears well-developed and well-nourished.  Eyes: Conjunctivae and EOM are normal. Pupils are equal, round, and reactive to light.  Cardiovascular: Normal rate and intact distal pulses.   Pulmonary/Chest: Effort normal.  Musculoskeletal:  Patient ambulates without aid with a normal gait. She has pain with extension of the lumbar spine. She has pain in rocking over the lower vertebral bodies as well as pain over the PSIS actually right more than left. She has a equivocally positive Patrick's exam was she actually has more pain on the opposite side of the externally rotated hip. She has no pain with hip rotation internally. She has good distal strength.  Neurological: She is alert and oriented to person, place, and time. She exhibits normal muscle tone. Coordination  normal.  Skin: Skin is warm and dry. No rash noted. No erythema.  Psychiatric: She has a normal mood and affect. Her behavior is normal.  Nursing note and vitals reviewed.   Ortho Exam Imaging: No results found.  Past Medical/Family/Surgical/Social History: Medications & Allergies reviewed per EMR Patient Active Problem List   Diagnosis Date Noted  . BPPV (benign paroxysmal positional vertigo) 10/22/2016  . Anxiety state 06/22/2016  . Tachycardia 04/25/2016  . Bilateral occipital neuralgia   . Cryptogenic stroke ([place]) 04/13/2016  . Cerebral infarction ([place])   . Thyroid activity decreased   . Depression   . Hypothyroidism   . GERD (gastroesophageal reflux disease)   . Hyperlipidemia   . Hypertension   . IBS (irritable bowel syndrome)    Past Medical History:  Diagnosis Date  . Depression   . GERD (gastroesophageal reflux disease)   . Hypercholesterolemia   . Hypertension   . Hypothyroidism   . IBS (irritable bowel syndrome)   . Stroke ([place])   . Thyroid disease    Family History  Problem Relation Age of Onset  . Dementia Mother   . Hypercholesterolemia Mother   . Heart disease Father   . Stroke Father   . Heart disease Brother   . Congestive Heart Failure Sister    Past Surgical History:  Procedure Laterality Date  . ABDOMINAL HYSTERECTOMY    . APPENDECTOMY    . CESAREAN SECTION    . CHOLECYSTECTOMY    . EP IMPLANTABLE DEVICE N/A 12/28/2016   Procedure: Loop Recorder Insertion;  Surgeon: Evans Lance, MD;  Location: [facility];  Service: Cardiovascular;  Laterality: N/A;  . THYROIDECTOMY    . TRIGGER FINGER RELEASE     Social History   Occupational History  . Not on file.   Social History Main Topics  . Smoking status: Never Smoker  . Smokeless tobacco: Never Used  . Alcohol use No  . Drug use: No  . Sexual activity: Not on file

## 2017-03-27 ENCOUNTER — Ambulatory Visit (INDEPENDENT_AMBULATORY_CARE_PROVIDER_SITE_OTHER): Payer: Medicare Other

## 2017-03-27 ENCOUNTER — Encounter (INDEPENDENT_AMBULATORY_CARE_PROVIDER_SITE_OTHER): Payer: Self-pay | Admitting: Physical Medicine and Rehabilitation

## 2017-03-27 ENCOUNTER — Ambulatory Visit (INDEPENDENT_AMBULATORY_CARE_PROVIDER_SITE_OTHER): Payer: Medicare Other | Admitting: Physical Medicine and Rehabilitation

## 2017-03-27 VITALS — BP 135/58

## 2017-03-27 DIAGNOSIS — M461 Sacroiliitis, not elsewhere classified: Secondary | ICD-10-CM | POA: Diagnosis not present

## 2017-03-27 NOTE — Patient Instructions (Signed)

## 2017-03-27 NOTE — Progress Notes (Deleted)
Patient is here today for planned left Si joint injection.

## 2017-03-27 NOTE — Progress Notes (Signed)
April Armstrong - 66 y.o. female MRN [id number]  Date of birth: 12/09/1950  Office Visit Note: Visit Date: 03/27/2017 PCP: Donnie Coffin, MD Referred by: Alroy Dust, L.Marlou Sa, MD  Subjective: Chief Complaint  Patient presents with  . Lower Back - Pain   HPI: April Armstrong is a very pleasant 66 year old female with unfortunately difficult case of low back and buttock pain at this point. Please see our prior evaluation and management note for further details and justification for a complete a diagnostic and hopefully therapeutic left sacroiliac joint injection with fluoroscopic guidance.    ROS Otherwise per HPI.  Assessment & Plan: Visit Diagnoses:  1. Sacroiliitis ([place])     Plan: Findings:  Diagnostic and therapeutic left sacroiliac joint injection fluoroscopic guidance.    Meds & Orders: No orders of the defined types were placed in this encounter.   Orders Placed This Encounter  Procedures  . Large Joint Injection/Arthrocentesis  . XR C-ARM NO REPORT    Follow-up: Return if symptoms worsen or fail to improve.   Procedures: Large Joint Inj Date/Time: 03/27/2017 1:26 PM Performed by: Magnus Sinning Authorized by: Magnus Sinning   Consent Given by:  Patient Site marked: the procedure site was marked   Timeout: prior to procedure the correct patient, procedure, and site was verified   Indications:  Pain and diagnostic evaluation Location:  Sacroiliac Site:  L sacroiliac joint Prep: patient was prepped and draped in usual sterile fashion   Needle Size:  22 G Needle Length:  3.5 inches Approach:  Posterior Ultrasound Guidance: No   Fluoroscopic Guidance: Yes   Arthrogram: No   Medications:  80 mg methylPREDNISolone acetate 80 MG/ML; 2 mL bupivacaine 0.5 % Aspiration Attempted: No   Patient tolerance:  Patient tolerated the procedure well with no immediate complications  There was excellent flow of contrast producing a partial arthrogram of the sacroiliac joint.     No notes on file   Clinical History: Lumbar spine MRI 02/07/2016  L4-5: Moderately severe facet degenerative change is present. The disc is uncovered without bulging. The central canal is mildly narrowed. Mild to moderate foraminal narrowing is more notable on the left.  L5-S1: There is some facet degenerative change. No disc bulge or protrusion. The central canal and foramina are widely patent.  IMPRESSION: Mild spondylosis most notable at L4-5 where facet degenerative change results in trace anterolisthesis and there is mild to moderate foraminal narrowing, more notable on the left. The central canal is only mildly narrowed at this level. No nerve root compression is identified.  She reports that she has never smoked. She has never used smokeless tobacco.   Recent Labs  04/13/16 0512  HGBA1C 6.2*    Objective:  VS:  HT:    WT:   BMI:     BP:(!) 135/58  HR: bpm  TEMP: ( )  RESP:  Physical Exam  Musculoskeletal:  Patient ambulates without aid with concordant pain with extension rotation of the low back. She has positive forward finger sign and positive Saralyn Pilar sign left more than right.    Ortho Exam Imaging: Xr C-arm No Report  Result Date: 03/27/2017 Please see Notes or Procedures tab for imaging impression.   Past Medical/Family/Surgical/Social History: Medications & Allergies reviewed per EMR Patient Active Problem List   Diagnosis Date Noted  . BPPV (benign paroxysmal positional vertigo) 10/22/2016  . Anxiety state 06/22/2016  . Tachycardia 04/25/2016  . Bilateral occipital neuralgia   . Cryptogenic stroke ([place]) 04/13/2016  . Cerebral  infarction ([place])   . Thyroid activity decreased   . Depression   . Hypothyroidism   . GERD (gastroesophageal reflux disease)   . Hyperlipidemia   . Hypertension   . IBS (irritable bowel syndrome)    Past Medical History:  Diagnosis Date  . Depression   . GERD (gastroesophageal reflux disease)   .  Hypercholesterolemia   . Hypertension   . Hypothyroidism   . IBS (irritable bowel syndrome)   . Stroke ([place])   . Thyroid disease    Family History  Problem Relation Age of Onset  . Dementia Mother   . Hypercholesterolemia Mother   . Heart disease Father   . Stroke Father   . Heart disease Brother   . Congestive Heart Failure Sister    Past Surgical History:  Procedure Laterality Date  . ABDOMINAL HYSTERECTOMY    . APPENDECTOMY    . CESAREAN SECTION    . CHOLECYSTECTOMY    . EP IMPLANTABLE DEVICE N/A 12/28/2016   Procedure: Loop Recorder Insertion;  Surgeon: Evans Lance, MD;  Location: [facility];  Service: Cardiovascular;  Laterality: N/A;  . THYROIDECTOMY    . TRIGGER FINGER RELEASE     Social History   Occupational History  . Not on file.   Social History Main Topics  . Smoking status: Never Smoker  . Smokeless tobacco: Never Used  . Alcohol use No  . Drug use: No  . Sexual activity: Not on file

## 2017-03-28 ENCOUNTER — Ambulatory Visit (INDEPENDENT_AMBULATORY_CARE_PROVIDER_SITE_OTHER): Payer: Medicare Other | Admitting: *Deleted

## 2017-03-28 DIAGNOSIS — I6340 Cerebral infarction due to embolism of unspecified cerebral artery: Secondary | ICD-10-CM

## 2017-03-28 MED ORDER — BUPIVACAINE HCL 0.5 % IJ SOLN
2.0000 mL | INTRAMUSCULAR | Status: AC | PRN
  Administered 2017-03-27: 2 mL via INTRA_ARTICULAR

## 2017-03-28 MED ORDER — METHYLPREDNISOLONE ACETATE 80 MG/ML IJ SUSP
80.0000 mg | INTRAMUSCULAR | Status: AC | PRN
  Administered 2017-03-27: 80 mg via INTRA_ARTICULAR

## 2017-03-28 NOTE — Progress Notes (Signed)
[facility] Summary Report / Loop Recorder 

## 2017-04-03 ENCOUNTER — Telehealth (INDEPENDENT_AMBULATORY_CARE_PROVIDER_SITE_OTHER): Payer: Self-pay | Admitting: Physical Medicine and Rehabilitation

## 2017-04-04 NOTE — Telephone Encounter (Signed)
OV not sure what to do

## 2017-04-04 NOTE — Telephone Encounter (Signed)
Called patient and left a message for her to call back to schedule.

## 2017-04-04 NOTE — Telephone Encounter (Signed)
Scheduled for 5/17 at 0930.

## 2017-04-10 LAB — CUP PACEART REMOTE DEVICE CHECK
Implantable Pulse Generator Implant Date: 20180202
MDC IDC SESS DTM: 20180503173801

## 2017-04-11 ENCOUNTER — Encounter (INDEPENDENT_AMBULATORY_CARE_PROVIDER_SITE_OTHER): Payer: Self-pay | Admitting: Physical Medicine and Rehabilitation

## 2017-04-11 ENCOUNTER — Ambulatory Visit (INDEPENDENT_AMBULATORY_CARE_PROVIDER_SITE_OTHER): Payer: Medicare Other | Admitting: Physical Medicine and Rehabilitation

## 2017-04-11 VITALS — BP 100/62 | HR 60

## 2017-04-11 DIAGNOSIS — F411 Generalized anxiety disorder: Secondary | ICD-10-CM

## 2017-04-11 DIAGNOSIS — M609 Myositis, unspecified: Secondary | ICD-10-CM

## 2017-04-11 DIAGNOSIS — I6340 Cerebral infarction due to embolism of unspecified cerebral artery: Secondary | ICD-10-CM | POA: Diagnosis not present

## 2017-04-11 DIAGNOSIS — M47816 Spondylosis without myelopathy or radiculopathy, lumbar region: Secondary | ICD-10-CM

## 2017-04-11 DIAGNOSIS — G8929 Other chronic pain: Secondary | ICD-10-CM | POA: Diagnosis not present

## 2017-04-11 DIAGNOSIS — M5442 Lumbago with sciatica, left side: Secondary | ICD-10-CM | POA: Diagnosis not present

## 2017-04-11 MED ORDER — GABAPENTIN 300 MG PO CAPS: ORAL_CAPSULE | ORAL | 0 refills | Status: DC

## 2017-04-11 NOTE — Patient Instructions (Signed)
Mindfulness Meditation or Training  Dr. Tressie Ellis McGill  "The Big Three" that will safely increase your endurance and protect your back: modified curl-up, side bridge, and bird dog.  1. Modified Curl-Up Lie your back with one knee bent and one knee straight, this puts your pelvis in a neutral position and the muscles of your core in an optimal alignment of pull to avoid strain on the low back. Place your hands under the arch of your low back and ensure that this arch is maintained throughout the curl-up. Start by bracing your abdomen; this is different from flexing your abs, bear down through your belly. Now make sure you can take a breath in and a breath out while maintaining this brace. If you cannot, stop there and practice doing just that until you've got it mastered! Now, pretend that your spine in your neck and your upper back are cemented together and do not move independently. Pick a spot on the ceiling and focus your gaze there, lift your shoulder blades about 30 off the floor and slowly return to the start position. Take note of your neck, and ensure that your chin isn't poking forward when you do a curl up. If you're struggling with that, focus on making a double chin. Perform 3 sets of 10-12.  2. Side Bridge Lie on your side and prop yourself up on your elbow. Ensure that your elbow is directly under your shoulder to avoid any unnecessary strain through your shoulder joint. With your legs straight, place your top foot on the ground in front of your bottom foot. Place your top hand on your bottom shoulder. While maintaining the natural curve of your spine, that is to say, be sure that your upper body isn't twisted or leaning forward, brace your abdomen, squeeze through your gluteals (clench your bum), and lift your hips up off the ground. Don't forget to breathe! Hold for 8-10 seconds, repeat 3 times. As the exercise becomes easier, increase the number of repetitions as opposed to the length of  time. There are a number of ways to modify this exercise in order to increase or decrease the difficulty such as the example below on the right. If it's not challenging enough, try putting that top hand on your top hip, or straight up in the air, but again, be sure your body stays straight!  3. Bird Dog Start on your hands and knees with your hands shoulder width apart directly under your shoulders, and knees hip width apart directly under your hips. Maintain a neutral spine. Brace through your abdomen and squeeze your gluteals. Ensure you can maintain this while you take a breath in and out. Lift your right arm in front until it's level with your shoulder, squeezing the muscles between your shoulder blades as you do so. At the same time, extend your left leg straight back until it is level with your hips, squeezing your gluteals, and keeping your hips square to the floor. Return to the starting position in a slow and controlled manner, and perform the same action with the left arm and right leg. That is one repetition. Perform 3 sets of 8-10 repetitions. As this exercise becomes easy, focus on co-contracting the muscles of your forearm and arms while you extend, the same goes for the muscles of your legs. For an additional challenge, instead of putting your hand and knee back down on the ground between reps, try just sweeping the floor and performing the next rep right away, or  draw a square with your arm and leg and then sweep the floor.  *Avoid exercises that forward flex the spine or extend the spine.

## 2017-04-11 NOTE — Progress Notes (Deleted)
Lower back pain that runs down both legs more on the left. On the right side the pain is more in the buttock. She describes the pain as a deep, dull ache all the time. The pain is there now when lying in the bed which she says is new.

## 2017-04-15 ENCOUNTER — Encounter (INDEPENDENT_AMBULATORY_CARE_PROVIDER_SITE_OTHER): Payer: Self-pay | Admitting: Physical Medicine and Rehabilitation

## 2017-04-15 NOTE — Progress Notes (Signed)
April Armstrong - 66 y.o. female MRN [id number]  Date of birth: 1951/04/04  Office Visit Note: Visit Date: 04/11/2017 PCP: Alroy Dust, L.Marlou Sa, MD Referred by: Alroy Dust, L.Marlou Sa, MD  Subjective: Chief Complaint  Patient presents with  . Lower Back - Pain   HPI: April Armstrong is a very pleasant 66 year old female with chronic worsening low back pain and bilateral buttock and leg pain. Her case is interesting in the fact that when we first saw her we completed facet joint blocks which were very beneficial. After double block paradigm we did complete radiofrequency ablation which was really ineffectual to a degree. It helped her lower back quite a bit but then she began having this buttock pain and referral pain down the leg. She denies any paresthesias or focal weakness. There is been no trauma since the prior MRI in 2017 which was only first saw her. Her case is, given by significant depression and anxiety. She is taking a dull, Zoloft, Wellbutrin and trazodone as well as Ativan when necessary. We have completed an 3 epidural injections and sacroiliac joint injection with no definite relief of her buttock and leg pain. Epidural injection at least from a transforaminal approach gave her may be 3 weeks of sustained relief. The sacroiliac joint injection gave her just a few days of relief. There is a very emotional quality of the amount of pain that she's having. She does not really endorse widespread pain. Pain is mostly with standing and ambulating. Pain is worse with movement. It is better at rest. She does have difficulty sleeping at night.    Review of Systems  Constitutional: Positive for malaise/fatigue. Negative for chills, fever and weight loss.  HENT: Negative for hearing loss and sinus pain.   Eyes: Negative for blurred vision, double vision and photophobia.  Respiratory: Negative for cough and shortness of breath.   Cardiovascular: Negative for chest pain, palpitations and leg swelling.    Gastrointestinal: Negative for abdominal pain, nausea and vomiting.  Genitourinary: Negative for flank pain.  Musculoskeletal: Positive for back pain. Negative for myalgias.  Skin: Negative for itching and rash.  Neurological: Negative for tremors, focal weakness and weakness.  Endo/Heme/Allergies: Negative.   Psychiatric/Behavioral: Negative for depression.  All other systems reviewed and are negative.  Otherwise per HPI.  Assessment & Plan: Visit Diagnoses:  1. Chronic bilateral low back pain with left-sided sciatica   2. Spondylosis without myelopathy or radiculopathy, lumbar region   3. Myofascitis   4. Anxiety state     Plan: Findings:  Chronic recalcitrant low back pain and now buttock and radicular type pain down the leg but without paresthesia. This is more left than right. She describes this pain as deep and dull and aching all the time. This seems to be worsening and she seems to be having increased anxiety of the thought of worsening pain. 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 She does have significant facet arthropathy at L4-5 but we have completed radiofrequency ablation of this level and I think we have gotten relief of some of the back pain April Armstrong referral down the legs. We have not completed diagnostic facet joint block at L5-S1. At this point that I think regrouping with a physical therapist to see how that does with dry needling and core strengthening. We are also going to start her on gabapentin at night. There is not a lot of medications I continues with her given  the prior medication use for her anxiety and depression. She really in my mind is not a candidate for prolonged opioid therapy but this is something she could pursue we just cannot do that in the office here. I spent more than 25 minutes speaking face-to-face with the patient with 50% of the time in counseling.we spoke at great  detail about mindfulness medication techniques and that is something I think she should pursue. We also printed neutral spine core exercises and want her to research Dr. Tenna Child exercises.    Meds & Orders:  Meds ordered this encounter  Medications  . gabapentin (NEURONTIN) 300 MG capsule    Sig: Take 1 capsule by mouth at night for 7 nights and then 1 in the morning and one at night for 7 days and then 3 times a day.    Dispense:  90 capsule    Refill:  0    Orders Placed This Encounter  Procedures  . Ambulatory referral to Physical Therapy    Follow-up: Return in about 4 weeks (around 05/09/2017).   Procedures: No procedures performed  No notes on file   Clinical History: Lumbar spine MRI 02/07/2016  L4-5: Moderately severe facet degenerative change is present. The disc is uncovered without bulging. The central canal is mildly narrowed. Mild to moderate foraminal narrowing is more notable on the left.  L5-S1: There is some facet degenerative change. No disc bulge or protrusion. The central canal and foramina are widely patent.  IMPRESSION: Mild spondylosis most notable at L4-5 where facet degenerative change results in trace anterolisthesis and there is mild to moderate foraminal narrowing, more notable on the left. The central canal is only mildly narrowed at this level. No nerve root compression is identified.  She reports that she has never smoked. She has never used smokeless tobacco. No results for input(s): HGBA1C, LABURIC in the last 8760 hours.  Objective:  VS:  HT:    WT:   BMI:     BP:100/62  HR:60bpm  TEMP: ( )  RESP:  Physical Exam  Constitutional: She is oriented to person, place, and time. She appears well-developed and well-nourished. No distress.  HENT:  Head: Normocephalic and atraumatic.  Nose: Nose normal.  Mouth/Throat: Oropharynx is clear and moist.  Eyes: Conjunctivae are normal. Pupils are equal, round, and reactive to light.  Neck:  Normal range of motion. Neck supple.  Cardiovascular: Regular rhythm and intact distal pulses.   Pulmonary/Chest: Effort normal. No respiratory distress.  Abdominal: She exhibits no distension. There is no guarding.  Musculoskeletal:  Patient ambulates without aid. She is somewhat slow to rise from seated position and has concordant pain with extension rotation of the low back and ablation of the belt line. She has a negative slump test bilaterally and good distal strength without clonus. No pain over the greater trochanters. No pain with hip rotation.  Neurological: She is alert and oriented to person, place, and time. She exhibits normal muscle tone. Coordination normal.  Skin: Skin is warm. No rash noted. No erythema.  Psychiatric: She has a normal mood and affect. Her behavior is normal.  Nursing note and vitals reviewed.   Ortho Exam Imaging: No results found.  Past Medical/Family/Surgical/Social History: Medications & Allergies reviewed per EMR Patient Active Problem List   Diagnosis Date Noted  . BPPV (benign paroxysmal positional vertigo) 10/22/2016  . Anxiety state 06/22/2016  . Tachycardia 04/25/2016  . Bilateral occipital neuralgia   . Cryptogenic stroke ([place]) 04/13/2016  .  Cerebral infarction ([place])   . Thyroid activity decreased   . Depression   . Hypothyroidism   . GERD (gastroesophageal reflux disease)   . Hyperlipidemia   . Hypertension   . IBS (irritable bowel syndrome)    Past Medical History:  Diagnosis Date  . Depression   . GERD (gastroesophageal reflux disease)   . Hypercholesterolemia   . Hypertension   . Hypothyroidism   . IBS (irritable bowel syndrome)   . Stroke ([place])   . Thyroid disease    Family History  Problem Relation Age of Onset  . Dementia Mother   . Hypercholesterolemia Mother   . Heart disease Father   . Stroke Father   . Heart disease Brother   . Congestive Heart Failure Sister    Past Surgical History:  Procedure Laterality  Date  . ABDOMINAL HYSTERECTOMY    . APPENDECTOMY    . CESAREAN SECTION    . CHOLECYSTECTOMY    . EP IMPLANTABLE DEVICE N/A 12/28/2016   Procedure: Loop Recorder Insertion;  Surgeon: Evans Lance, MD;  Location: [facility];  Service: Cardiovascular;  Laterality: N/A;  . THYROIDECTOMY    . TRIGGER FINGER RELEASE     Social History   Occupational History  . Not on file.   Social History Main Topics  . Smoking status: Never Smoker  . Smokeless tobacco: Never Used  . Alcohol use No  . Drug use: No  . Sexual activity: Not on file

## 2017-04-23 ENCOUNTER — Ambulatory Visit: Payer: Medicare Other | Admitting: Neurology

## 2017-04-23 DIAGNOSIS — D0461 Carcinoma in situ of skin of right upper limb, including shoulder: Secondary | ICD-10-CM | POA: Diagnosis not present

## 2017-04-23 DIAGNOSIS — D485 Neoplasm of uncertain behavior of skin: Secondary | ICD-10-CM | POA: Diagnosis not present

## 2017-04-23 DIAGNOSIS — L821 Other seborrheic keratosis: Secondary | ICD-10-CM | POA: Diagnosis not present

## 2017-04-23 DIAGNOSIS — L570 Actinic keratosis: Secondary | ICD-10-CM | POA: Diagnosis not present

## 2017-04-24 ENCOUNTER — Ambulatory Visit: Payer: Medicare Other | Attending: Physical Medicine and Rehabilitation

## 2017-04-24 DIAGNOSIS — M6281 Muscle weakness (generalized): Secondary | ICD-10-CM | POA: Diagnosis not present

## 2017-04-24 DIAGNOSIS — M5442 Lumbago with sciatica, left side: Secondary | ICD-10-CM | POA: Insufficient documentation

## 2017-04-24 DIAGNOSIS — M5441 Lumbago with sciatica, right side: Secondary | ICD-10-CM | POA: Insufficient documentation

## 2017-04-24 DIAGNOSIS — G8929 Other chronic pain: Secondary | ICD-10-CM | POA: Insufficient documentation

## 2017-04-24 DIAGNOSIS — M25652 Stiffness of left hip, not elsewhere classified: Secondary | ICD-10-CM | POA: Diagnosis not present

## 2017-04-24 DIAGNOSIS — M25651 Stiffness of right hip, not elsewhere classified: Secondary | ICD-10-CM | POA: Diagnosis not present

## 2017-04-24 NOTE — Therapy (Signed)
[facility] [address], [place], [place], [address] Phone: [phone]   Fax:  [phone]  Physical Therapy Evaluation  Patient Details  Name: April Armstrong MRN: [id number] Date of Birth: 05/28/1951 Referring Provider: Magnus Sinning, MD  Encounter Date: 04/24/2017      PT End of Session - 04/24/17 9794    Visit Number 1   Number of Visits 10   Date for PT Re-Evaluation 06/19/17   PT Start Time 8016   PT Stop Time 1529   PT Time Calculation (min) 37 min   Activity Tolerance Patient tolerated treatment well   Behavior During Therapy [facility] for tasks assessed/performed      Past Medical History:  Diagnosis Date  . Depression   . GERD (gastroesophageal reflux disease)   . Hypercholesterolemia   . Hypertension   . Hypothyroidism   . IBS (irritable bowel syndrome)   . Stroke ([place])   . Thyroid disease     Past Surgical History:  Procedure Laterality Date  . ABDOMINAL HYSTERECTOMY    . APPENDECTOMY    . CESAREAN SECTION    . CHOLECYSTECTOMY    . EP IMPLANTABLE DEVICE N/A 12/28/2016   Procedure: Loop Recorder Insertion;  Surgeon: Evans Lance, MD;  Location: [place] [facility];  Service: Cardiovascular;  Laterality: N/A;  . THYROIDECTOMY    . TRIGGER FINGER RELEASE      There were no vitals filed for this visit.       Subjective Assessment - 04/24/17 1444    Subjective Pt reports to PT with complaints of LBP and bilateral buttock pain and LE radiculopathy. Pain has been present for many years.  Pt has had nerve root ablation and epidural injections with temporary relief of symptoms.     Pertinent History ablation and facet joint block.  3 epidural and SI joint injections without relief.  Skin cancer- no [place]   Limitations Sitting;Standing;Walking   How long can you sit comfortably? 30 minutes   How long can you stand comfortably? 5-10 minutes   How long can you walk comfortably? varies   Diagnostic  tests MRI: facet OA L4-5   Patient Stated Goals walk for exercise, stand longer to do housework, be more active   Currently in Pain? Yes   Pain Score 1   up to 10/10 with standing and walking   Pain Location Back   Pain Orientation Right;Left   Pain Descriptors / Indicators Sore;Constant;Dull   Pain Type Chronic pain   Pain Radiating Towards bilateral buttock and legs   Pain Onset More than a month ago   Pain Frequency Constant   Aggravating Factors  standing, walking, sitting   Pain Relieving Factors medication, change of position   Effect of Pain on Daily Activities not able to grocery shop. 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 1   Has the patient had a decrease in activity level because of a fear of falling?  No   Is the patient reluctant to leave their home because of a fear of falling?  No     Home Environment   Living Environment Private residence   Living Arrangements Spouse/significant other   Type of [place] to live on main level with bedroom/bathroom     Prior Function   Level of Independence Independent   Vocation Retired   [employer] helps with grandchildren   Leisure likes to walk but not able to due to pain     Cognition   Overall Cognitive Status Within Functional Limits for tasks assessed     Observation/Other Assessments   Focus on  Therapeutic Outcomes (FOTO)  62% limitation     Posture/Postural Control   Posture/Postural Control Postural limitations   Postural Limitations Rounded Shoulders;Forward head;Increased lumbar lordosis     ROM / Strength   AROM / PROM / Strength AROM;PROM;Strength     AROM   Overall AROM  Within functional limits for tasks performed   Overall AROM Comments Central lumbar pain with end range lumbar A/ROM     PROM   Overall PROM  Deficits   Overall PROM Comments hamstring length is WFLs.  Hip ER and IR limited by 25% bil.       Strength   Overall Strength Deficits   Overall Strength Comments 4 to 4+/5 bil. LE strength     Palpation   Palpation comment diffuse palpable tenderness over bil lumbar parapsinals and deep gluteals with trigger points in gluteals and piriformis     Bed Mobility   Bed Mobility Rolling Left;Left Sidelying to Sit   Rolling Left 6: Modified independent (Device/Increase time)   Left Sidelying to Sit 6: Modified independent (Device/Increase time)     Transfers   Transfers Sit to Stand;Stand to Sit   Sit to Stand 7: Independent   Stand to Sit 7: Independent     Ambulation/Gait   Ambulation/Gait Yes   Gait Pattern Within Functional Limits            Objective measurements completed on examination: See above findings.                  PT Education - 04/24/17 1515    Education provided Yes   Education Details lumbar flexibility and walking program   Person(s) Educated Patient   Methods Explanation;Demonstration;Handout   Comprehension Verbalized understanding;Returned demonstration          PT Short Term Goals - 04/24/17 1521      PT SHORT TERM GOAL #1   Title Pt will be able to demo posture and body mechanics with housework and ADLs   Time 4   Period Weeks   Status New     PT SHORT TERM GOAL #2   Title be independent with initial HEP   Time 4   Period Weeks   Status New     PT SHORT TERM GOAL #3   Title report a 30%  reduction in LBP with housework and ADLs   Time 4   Period Weeks   Status New     PT SHORT TERM GOAL #4   Title stand for 5-10 minutes for housework without need to sit down   Time 4   Period Weeks  Status New           PT Long Term Goals - 04/24/17 1444      PT LONG TERM GOAL #1   Title be independent in advanced HEP   Time 8   Period Weeks   Status New     PT LONG TERM GOAL #2   Title reduce FOTO to < or = to 50% limitation   Time 8   Period Weeks   Status New     PT LONG TERM GOAL #3   Title tolerate standing for > or = to 15 minutes for cooking and washing dishes without need to rest   Time 8   Period Weeks   Status New     PT LONG TERM GOAL #4   Title perform regular walking for exercise 20-30 minutes a day   Time 8   Period Weeks   Status New     PT LONG TERM GOAL #5   Title report a 60% reduction in LBP and leg pain with housework and ADLs   Time 8   Period Weeks   Status New                Plan - 04/24/17 1525    Clinical Impression Statement Pt presents to PT with a chronic history of LBP with bil. LE pain that has been present for 7-8 years.  Pt has had ablasion and injections without relief of symptoms.  Pt reports up to 10/10 LBP and LE pain with standing and sitting > 5-10 minutes, is not able to stand for housework or walk for exercise.  Pt demonstrates bilateral hip stiffness, weak core difficult bed mobility and palpable tenderness and trigger points in Lt>Rt gluteals and lumbar paraspinals.  Pt will benefit from skilled PT for body mechanics eduction, hip and lumbar flexibility, core and hip strength and manual/modalities to improve mobility and tolerance for household tasks.     History and Personal Factors relevant to plan of care: anxiety, depression and chronic history of LBP with injections and ablasion   Clinical Presentation Evolving   Clinical Presentation due to: chronic with inability to stand for cooking or self-care >5  minutes. Assessment of multiple body areas.   Clinical Decision Making Moderate   Rehab Potential Good   PT Frequency 2x / week   PT Duration 8 weeks   PT Treatment/Interventions ADLs/Self Care Home Management;Cryotherapy;Electrical Stimulation;Functional mobility training;Stair training;Moist Heat;Therapeutic activities;Therapeutic exercise;Neuromuscular re-education;Patient/family education;Passive range of motion;Manual techniques;Energy conservation;Taping   PT Next Visit Plan body mechanics education, core strength, manual to gluteals, hip and lumbar flexibility   Consulted and Agree with Plan of Care Patient      Patient will benefit from skilled therapeutic intervention in order to improve the following deficits and impairments:  Postural dysfunction, Decreased strength, Improper body mechanics, Impaired flexibility, Decreased activity tolerance, Pain, Increased muscle spasms, Decreased endurance, Decreased range of motion  Visit Diagnosis: Chronic bilateral low back pain with bilateral sciatica - Plan: PT plan of care cert/re-cert  Muscle weakness (generalized) - Plan: PT plan of care cert/re-cert  Stiffness of left hip, not elsewhere classified - Plan: PT plan of care cert/re-cert  Stiffness of right hip, not elsewhere classified - Plan: PT plan of care cert/re-cert      G-Codes - 35/36/14 1451    Functional Assessment Tool Used (Outpatient Only) FOTO: 62% limitation   Functional Limitation Other PT primary   Other PT Primary Current Status (E3154) At least 60 percent  but less than 80 percent impaired, limited or restricted   Other PT Primary Goal Status (I1443) At least 40 percent but less than 60 percent impaired, limited or restricted       Problem List Patient Active Problem List   Diagnosis Date Noted  . BPPV (benign paroxysmal positional vertigo) 10/22/2016  . Anxiety state 06/22/2016  . Tachycardia 04/25/2016  . Bilateral occipital neuralgia   . Cryptogenic  stroke ([place]) 04/13/2016  . Cerebral infarction ([place])   . Thyroid activity decreased   . Depression   . Hypothyroidism   . GERD (gastroesophageal reflux disease)   . Hyperlipidemia   . Hypertension   . IBS (irritable bowel syndrome)      Sigurd Sos, PT 04/24/17 4:18 PM   [facility] [address], [place], [place], [address] Phone: [phone]   Fax:  [phone]  Name: Olanna Percifield MRN: [id number] Date of Birth: [birth year]

## 2017-04-24 NOTE — Patient Instructions (Addendum)
Perform all exercises below:  Hold _20___ seconds. Repeat _3___ times.  Do __3__ sessions per day. CAUTION: Movement should be gentle, steady and slow.  Knee to Chest  Lying supine, bend involved knee to chest. Perform with each leg.   Lumbar Rotation: Caudal - Bilateral (Supine)  Feet and knees together, arms outstretched, rotate knees left, turning head in opposite direction, until stretch is felt.      HIP: Hamstrings - Short Sitting   Rest leg on raised surface. Keep knee straight. Lift chest.   WALKING  Walking is a great form of exercise to increase your strength, endurance and overall fitness.  A walking program can help you start slowly and gradually build endurance as you go.  Everyone's ability is different, so each person's starting point will be different.  You do not have to follow them exactly.  The are just samples. You should simply find out what's right for you and stick to that program.   In the beginning, you'll start off walking 2-3 times a day for short distances.  As you get stronger, you'll be walking further at just 1-2 times per day.  A. You Can Walk For A Certain Length Of Time Each Day    Walk 5 minutes 3 times per day.  Increase 2 minutes every 2 days (3 times per day).  Work up to 25-30 minutes (1-2 times per day).   Example:   Day 1-2 5 minutes 3 times per day   Day 7-8 12 minutes 2-3 times per day   Day 13-14 25 minutes 1-2 times per day  B. You Can Walk For a Certain Distance Each Day     Distance can be substituted for time.    Example:   3 trips to mailbox (at road)   3 trips to corner of block   3 trips around the block     [facility] [address] [place], [place] [address] Phone # [phone] Fax [phone]

## 2017-04-26 ENCOUNTER — Ambulatory Visit: Payer: Medicare Other | Admitting: Physical Therapy

## 2017-04-29 ENCOUNTER — Ambulatory Visit (INDEPENDENT_AMBULATORY_CARE_PROVIDER_SITE_OTHER): Payer: Medicare Other | Admitting: *Deleted

## 2017-04-29 DIAGNOSIS — I6340 Cerebral infarction due to embolism of unspecified cerebral artery: Secondary | ICD-10-CM | POA: Diagnosis not present

## 2017-04-29 NOTE — Progress Notes (Signed)
[facility] Summary Report / Loop Recorder 

## 2017-05-06 ENCOUNTER — Ambulatory Visit: Payer: Medicare Other | Attending: Physical Medicine and Rehabilitation

## 2017-05-06 DIAGNOSIS — M25651 Stiffness of right hip, not elsewhere classified: Secondary | ICD-10-CM | POA: Insufficient documentation

## 2017-05-06 DIAGNOSIS — M5442 Lumbago with sciatica, left side: Secondary | ICD-10-CM | POA: Diagnosis not present

## 2017-05-06 DIAGNOSIS — G8929 Other chronic pain: Secondary | ICD-10-CM | POA: Diagnosis not present

## 2017-05-06 DIAGNOSIS — M6281 Muscle weakness (generalized): Secondary | ICD-10-CM | POA: Diagnosis not present

## 2017-05-06 DIAGNOSIS — M5441 Lumbago with sciatica, right side: Secondary | ICD-10-CM | POA: Diagnosis not present

## 2017-05-06 DIAGNOSIS — M25652 Stiffness of left hip, not elsewhere classified: Secondary | ICD-10-CM | POA: Insufficient documentation

## 2017-05-06 NOTE — Therapy (Signed)
[facility] [address] [place], [place], [address] Phone: [phone]   Fax:  [phone]  Physical Therapy Treatment  Patient Details  Name: April Armstrong MRN: [id number] Date of Birth: 1951-10-27 Referring Provider: Magnus Sinning, MD  Encounter Date: 05/06/2017      PT End of Session - 05/06/17 1654    Visit Number 2   Number of Visits 10   Date for PT Re-Evaluation 06/19/17   PT Start Time 4709   PT Stop Time 1657   PT Time Calculation (min) 44 min   Activity Tolerance Patient tolerated treatment well   Behavior During Therapy [facility] for tasks assessed/performed      Past Medical History:  Diagnosis Date  . Depression   . GERD (gastroesophageal reflux disease)   . Hypercholesterolemia   . Hypertension   . Hypothyroidism   . IBS (irritable bowel syndrome)   . Stroke ([place])   . Thyroid disease     Past Surgical History:  Procedure Laterality Date  . ABDOMINAL HYSTERECTOMY    . APPENDECTOMY    . CESAREAN SECTION    . CHOLECYSTECTOMY    . EP IMPLANTABLE DEVICE N/A 12/28/2016   Procedure: Loop Recorder Insertion;  Surgeon: Evans Lance, MD;  Location: [place] [facility];  Service: Cardiovascular;  Laterality: N/A;  . THYROIDECTOMY    . TRIGGER FINGER RELEASE      There were no vitals filed for this visit.                       [facility] Adult PT Treatment/Exercise - 05/06/17 0001      Exercises   Exercises Knee/Hip;Lumbar     Lumbar Exercises: Stretches   Active Hamstring Stretch 3 reps;20 seconds   Single Knee to Chest Stretch 3 reps;20 seconds   Lower Trunk Rotation 3 reps;20 seconds   Piriformis Stretch 3 reps;20 seconds     Lumbar Exercises: Aerobic   UBE (Upper Arm Bike) --     Lumbar Exercises: Supine   Ab Set 10 reps;5 seconds   Other Supine Lumbar Exercises horizontal abduction with yellow theraband with abdominal bracing 2x10     Lumbar Exercises: Sidelying    Clam 20 reps  with abdominal bracing     Knee/Hip Exercises: Aerobic   Nustep Level 1x 10 minutes  PT present to discuss progress with walking program                PT Education - 05/06/17 1646    Education provided Yes   Education Details clam, horizontal abduction, TA activation   Person(s) Educated Patient   Methods Explanation;Demonstration;Handout   Comprehension Verbalized understanding;Returned demonstration          PT Short Term Goals - 05/06/17 1622      PT SHORT TERM GOAL #1   Title Pt will be able to demo posture and body mechanics with housework and ADLs   Time 4   Period Weeks   Status On-going     PT SHORT TERM GOAL #2   Title be independent with initial HEP   Time 4   Period Weeks   Status On-going     PT SHORT TERM GOAL #3   Title report a 30% reduction in LBP with housework and ADLs   Time 4   Period Weeks   Status On-going           PT Long Term Goals - 04/24/17 1444  PT LONG TERM GOAL #1   Title be independent in advanced HEP   Time 8   Period Weeks   Status New     PT LONG TERM GOAL #2   Title reduce FOTO to < or = to 50% limitation   Time 8   Period Weeks   Status New     PT LONG TERM GOAL #3   Title tolerate standing for > or = to 15 minutes for cooking and washing dishes without need to rest   Time 8   Period Weeks   Status New     PT LONG TERM GOAL #4   Title perform regular walking for exercise 20-30 minutes a day   Time 8   Period Weeks   Status New     PT LONG TERM GOAL #5   Title report a 60% reduction in LBP and leg pain with housework and ADLs   Time 8   Period Weeks   Status New               Plan - 05/06/17 1623    Clinical Impression Statement Pt has been independent and compliant with initial HEP for flexibility issued at evaluation.  Pt has started waking short distances and reports significant fatigue with this activity.  Pt with only 1 session after evaluation so limited progress  toward goals.  Pt will continue to benefit from skilled PT for hip and lumbar flexibility, body mechanics education, core and hip strength and manual and modalities to improve mobility and tolerance for household tasks.     Rehab Potential Good   PT Next Visit Plan body mechanics education, core strength, manual to gluteals, hip and lumbar flexibility.  Arm bike seated on ball   Recommended Other Services initial certification is signed   Consulted and Agree with Plan of Care Patient      Patient will benefit from skilled therapeutic intervention in order to improve the following deficits and impairments:  Postural dysfunction, Decreased strength, Improper body mechanics, Impaired flexibility, Decreased activity tolerance, Pain, Increased muscle spasms, Decreased endurance, Decreased range of motion  Visit Diagnosis: Chronic bilateral low back pain with bilateral sciatica  Muscle weakness (generalized)  Stiffness of left hip, not elsewhere classified  Stiffness of right hip, not elsewhere classified     Problem List Patient Active Problem List   Diagnosis Date Noted  . BPPV (benign paroxysmal positional vertigo) 10/22/2016  . Anxiety state 06/22/2016  . Tachycardia 04/25/2016  . Bilateral occipital neuralgia   . Cryptogenic stroke ([place]) 04/13/2016  . Cerebral infarction ([place])   . Thyroid activity decreased   . Depression   . Hypothyroidism   . GERD (gastroesophageal reflux disease)   . Hyperlipidemia   . Hypertension   . IBS (irritable bowel syndrome)     Sigurd Sos, PT 05/06/17 4:56 PM  [facility] [address], [place], [place], [address] Phone: [phone]   Fax:  [phone]  Name: April Armstrong MRN: [id number] Date of Birth: 1951-10-18

## 2017-05-06 NOTE — Patient Instructions (Addendum)
Lower abdominal/core stability exercises  1. Practice your breathing technique: Inhale through your nose expanding your belly and rib cage. Try not to breathe into your chest. Exhale slowly and gradually out your mouth feeling a sense of softness to your body. Practice multiple times. This can be performed unlimited.  2. Finding the lower abdominals. Laying on your back with the knees bent, place your fingers just below your belly button. Using your breathing technique from above, on your exhale gently pull the belly button away from your fingertips without tensing any other muscles. Practice this 5x. Next, as you exhale, draw belly button inwards and hold onto it...then feel as if you are pulling that muscle across your pelvis like you are tightening a belt. This can be hard to do at first so be patient and practice. Do 5-10 reps 1-3 x day. Always recognize quality over quantity; if your abdominal muscles become tired you will notice you may    (Home) Flexion: Pelvic Tilt    Lie with neck supported, knees bent, feet flat. Tighten and suck stomach in, pushing back down against surface. Do not push down with legs.  Hold 5 seconds. Repeat _10___ times per set. Do __2__ sets per session. Do __2__ sessions per week.  Copyright  VHI. All rights reserved.    Abduction: Clam (Eccentric) - Side-Lying    Lie on side with knees bent. Lift top knee, keeping feet together. Keep trunk steady. Slowly lower for 3-5 seconds. _2x10__ reps per set, _1-2__ sets per day   Side Pull: Double Arm   On back, knees bent, feet flat. Arms perpendicular to body, shoulder level, elbows straight but relaxed. Pull arms out to sides, elbows straight. Resistance band comes across collarbones, hands toward floor. Hold momentarily. Slowly return to starting position. Repeat _2x10__ times. Band color _yellow____          [facility] [address], [place], [place] [address] Phone #  [phone] Fax [phone]____

## 2017-05-09 ENCOUNTER — Encounter: Payer: Medicare Other | Admitting: Physical Therapy

## 2017-05-10 LAB — CUP PACEART REMOTE DEVICE CHECK
Implantable Pulse Generator Implant Date: 20180202
MDC IDC SESS DTM: 20180602174145

## 2017-05-16 ENCOUNTER — Ambulatory Visit: Payer: Medicare Other | Admitting: Physical Therapy

## 2017-05-16 ENCOUNTER — Encounter: Payer: Self-pay | Admitting: Interventional Cardiology

## 2017-05-16 DIAGNOSIS — M25651 Stiffness of right hip, not elsewhere classified: Secondary | ICD-10-CM | POA: Diagnosis not present

## 2017-05-16 DIAGNOSIS — M5441 Lumbago with sciatica, right side: Principal | ICD-10-CM

## 2017-05-16 DIAGNOSIS — M25652 Stiffness of left hip, not elsewhere classified: Secondary | ICD-10-CM | POA: Diagnosis not present

## 2017-05-16 DIAGNOSIS — M5442 Lumbago with sciatica, left side: Principal | ICD-10-CM

## 2017-05-16 DIAGNOSIS — G8929 Other chronic pain: Secondary | ICD-10-CM

## 2017-05-16 DIAGNOSIS — M6281 Muscle weakness (generalized): Secondary | ICD-10-CM

## 2017-05-16 NOTE — Therapy (Signed)
[facility] [address], [place], [place], [address] Phone: [phone]   Fax:  [phone]  Physical Therapy Treatment  Patient Details  Name: April Armstrong MRN: [id number] Date of Birth: 1951-09-02 Referring Provider: Magnus Sinning, MD  Encounter Date: 05/16/2017      PT End of Session - 05/16/17 1600    Visit Number 3   Number of Visits 10   Date for PT Re-Evaluation 06/19/17   PT Start Time 1530   PT Stop Time 6440   PT Time Calculation (min) 45 min   Activity Tolerance Patient tolerated treatment well      Past Medical History:  Diagnosis Date  . Depression   . GERD (gastroesophageal reflux disease)   . Hypercholesterolemia   . Hypertension   . Hypothyroidism   . IBS (irritable bowel syndrome)   . Stroke ([place])   . Thyroid disease     Past Surgical History:  Procedure Laterality Date  . ABDOMINAL HYSTERECTOMY    . APPENDECTOMY    . CESAREAN SECTION    . CHOLECYSTECTOMY    . EP IMPLANTABLE DEVICE N/A 12/28/2016   Procedure: Loop Recorder Insertion;  Surgeon: Evans Lance, MD;  Location: [facility];  Service: Cardiovascular;  Laterality: N/A;  . THYROIDECTOMY    . TRIGGER FINGER RELEASE      There were no vitals filed for this visit.      Subjective Assessment - 05/16/17 1530    Subjective Today is not a good day with my IBS.  I don't walk far 5-10 minutes only.  I've been lifting too much.  Currently in right lateral hip but last night in left hip when washing dishes.  Leaving this weekend to drive to [place] to see grandchildren.     Currently in Pain? Yes   Pain Score 4    Pain Location Hip   Pain Orientation Right   Aggravating Factors  standing especially if slightly bent forward for making the bed or washing dishes                         [facility] Adult PT Treatment/Exercise - 05/16/17 0001      Therapeutic Activites    Therapeutic Activities Other  Therapeutic Activities   Other Therapeutic Activities walking, standing, sitting     Neuro Re-ed    Neuro Re-ed Details  transverse abdominus activation     Lumbar Exercises: Stretches   Active Hamstring Stretch 3 reps;20 seconds   Active Hamstring Stretch Limitations seated with stool   Double Knee to Chest Stretch Limitations hip/knee flexion with red ball 10x   Lower Trunk Rotation 3 reps;20 seconds   Lower Trunk Rotation Limitations using red ball     Lumbar Exercises: Seated   Sit to Stand 10 reps   Sit to Stand Limitations from mat table without UEs      Lumbar Exercises: Supine   Ab Set 10 reps;5 seconds   AB Set Limitations with legs on red ball   Isometric Hip Flexion 5 reps   Isometric Hip Flexion Limitations with legs on red ball   Other Supine Lumbar Exercises whole leg press down on red ball 10x right/left 5 sec holds     Lumbar Exercises: Sidelying   Clam 15 reps  with abdominal bracing     Knee/Hip Exercises: Aerobic   Nustep Level 1x 12 minutes  PT present to discuss progress with walking program  Shoulder Exercises: ROM/Strengthening   UBE (Upper Arm Bike) sitting on ball 6 min                  PT Short Term Goals - 05/16/17 1609      PT SHORT TERM GOAL #1   Title Pt will be able to demo posture and body mechanics with housework and ADLs   Time 4   Period Weeks   Status On-going     PT SHORT TERM GOAL #2   Title be independent with initial HEP   Time 4   Period Weeks   Status On-going     PT SHORT TERM GOAL #3   Title report a 30% reduction in LBP with housework and ADLs   Time 4   Period Weeks   Status On-going     PT SHORT TERM GOAL #4   Title stand for 5-10 minutes for housework without need to sit down   Time 4   Period Weeks   Status On-going           PT Long Term Goals - 05/16/17 1610      PT LONG TERM GOAL #1   Title be independent in advanced HEP   Time 8   Period Weeks   Status On-going     PT LONG  TERM GOAL #2   Title reduce FOTO to < or = to 50% limitation   Time 8   Period Weeks   Status On-going     PT LONG TERM GOAL #3   Title tolerate standing for > or = to 15 minutes for cooking and washing dishes without need to rest   Period Weeks   Status On-going     PT LONG TERM GOAL #4   Title perform regular walking for exercise 20-30 minutes a day   Time 8   Period Weeks   Status On-going     PT LONG TERM GOAL #5   Title report a 60% reduction in LBP and leg pain with housework and ADLs   Time 8   Period Weeks   Status On-going               Plan - 05/16/17 1600    Clinical Impression Statement The patient has a long history of chronic LBP with recent exacerbation.   She is receptive to low level progression of stretching and lumbo/pelvic/hip strengthening.  Verbal cues for transverse abdominus muscle activation and to coordinate breathing.  She reports she feels better during and following treatment session.     PT Frequency 2x / week   PT Duration 8 weeks   PT Treatment/Interventions ADLs/Self Care Home Management;Cryotherapy;Electrical Stimulation;Functional mobility training;Stair training;Moist Heat;Therapeutic activities;Therapeutic exercise;Neuromuscular re-education;Patient/family education;Passive range of motion;Manual techniques;Energy conservation;Taping   PT Next Visit Plan body mechanics education, core strength, manual to gluteals, hip and lumbar flexibility      Patient will benefit from skilled therapeutic intervention in order to improve the following deficits and impairments:  Postural dysfunction, Decreased strength, Improper body mechanics, Impaired flexibility, Decreased activity tolerance, Pain, Increased muscle spasms, Decreased endurance, Decreased range of motion  Visit Diagnosis: Chronic bilateral low back pain with bilateral sciatica  Muscle weakness (generalized)  Stiffness of left hip, not elsewhere classified  Stiffness of right  hip, not elsewhere classified     Problem List Patient Active Problem List   Diagnosis Date Noted  . BPPV (benign paroxysmal positional vertigo) 10/22/2016  . Anxiety state 06/22/2016  . Tachycardia 04/25/2016  .  Bilateral occipital neuralgia   . Cryptogenic stroke ([place]) 04/13/2016  . Cerebral infarction ([place])   . Thyroid activity decreased   . Depression   . Hypothyroidism   . GERD (gastroesophageal reflux disease)   . Hyperlipidemia   . Hypertension   . IBS (irritable bowel syndrome)    Ruben Im, PT 05/16/17 4:16 PM Phone: [phone] Fax: [phone] Alvera Singh 05/16/2017, 4:13 PM  [facility] [address] [place], [place], [address] Phone: [phone]   Fax:  [phone]  Name: April Armstrong MRN: [id number] Date of Birth: 03/18/1951

## 2017-05-27 ENCOUNTER — Ambulatory Visit: Payer: Medicare Other | Attending: Physical Medicine and Rehabilitation

## 2017-05-27 ENCOUNTER — Ambulatory Visit (INDEPENDENT_AMBULATORY_CARE_PROVIDER_SITE_OTHER): Payer: Medicare Other | Admitting: *Deleted

## 2017-05-27 DIAGNOSIS — M6281 Muscle weakness (generalized): Secondary | ICD-10-CM | POA: Diagnosis not present

## 2017-05-27 DIAGNOSIS — M25652 Stiffness of left hip, not elsewhere classified: Secondary | ICD-10-CM | POA: Insufficient documentation

## 2017-05-27 DIAGNOSIS — I6340 Cerebral infarction due to embolism of unspecified cerebral artery: Secondary | ICD-10-CM

## 2017-05-27 DIAGNOSIS — M5442 Lumbago with sciatica, left side: Secondary | ICD-10-CM | POA: Insufficient documentation

## 2017-05-27 DIAGNOSIS — M5441 Lumbago with sciatica, right side: Secondary | ICD-10-CM | POA: Diagnosis not present

## 2017-05-27 DIAGNOSIS — M25651 Stiffness of right hip, not elsewhere classified: Secondary | ICD-10-CM | POA: Diagnosis not present

## 2017-05-27 DIAGNOSIS — G8929 Other chronic pain: Secondary | ICD-10-CM | POA: Diagnosis not present

## 2017-05-27 NOTE — Therapy (Signed)
[facility] [address] [place], [place], [address] Phone: [phone]   Fax:  [phone]  Physical Therapy Treatment  Patient Details  Name: April Armstrong MRN: [id number] Date of Birth: [birth year] Referring Provider: Magnus Sinning, MD  Encounter Date: 05/27/2017      PT End of Session - 05/27/17 1519    Visit Number 4   Number of Visits 10   Date for PT Re-Evaluation 06/19/17   PT Start Time 2595   PT Stop Time 1534   PT Time Calculation (min) 48 min   Activity Tolerance Patient tolerated treatment well   Behavior During Therapy [facility] for tasks assessed/performed      Past Medical History:  Diagnosis Date  . Depression   . GERD (gastroesophageal reflux disease)   . Hypercholesterolemia   . Hypertension   . Hypothyroidism   . IBS (irritable bowel syndrome)   . Stroke ([place])   . Thyroid disease     Past Surgical History:  Procedure Laterality Date  . ABDOMINAL HYSTERECTOMY    . APPENDECTOMY    . CESAREAN SECTION    . CHOLECYSTECTOMY    . EP IMPLANTABLE DEVICE N/A 12/28/2016   Procedure: Loop Recorder Insertion;  Surgeon: Evans Lance, MD;  Location: [facility];  Service: Cardiovascular;  Laterality: N/A;  . THYROIDECTOMY    . TRIGGER FINGER RELEASE      There were no vitals filed for this visit.      Subjective Assessment - 05/27/17 1451    Subjective I traveled to [place] and I did'nt do my exercises.     Pertinent History ablation and facet joint block.  3 epidural and SI joint injections without relief.  Skin cancer- no [place]   Currently in Pain? Yes   Pain Score 5    Pain Location Hip   Pain Orientation Right;Mid;Lower   Pain Descriptors / Indicators Sore   Pain Type Chronic pain   Pain Onset More than a month ago   Pain Frequency Constant   Aggravating Factors  standing, housework   Pain Relieving Factors medication, change of position                          [facility] Adult PT Treatment/Exercise - 05/27/17 0001      Neuro Re-ed    Neuro Re-ed Details  transverse abdominus activation     Lumbar Exercises: Stretches   Active Hamstring Stretch 3 reps;20 seconds   Single Knee to Chest Stretch 3 reps;20 seconds   Double Knee to Chest Stretch 3 reps;20 seconds   Lower Trunk Rotation 3 reps;20 seconds     Lumbar Exercises: Seated   Sit to Stand 10 reps   Sit to Stand Limitations from mat table without UEs      Lumbar Exercises: Supine   Ab Set 10 reps;5 seconds   AB Set Limitations with legs on red ball   Other Supine Lumbar Exercises hoirzontal abduction: yellow band with adominal bracing 2x10     Knee/Hip Exercises: Aerobic   Nustep Level 1x 10 minutes  PT present to discuss progress with walking program     Shoulder Exercises: ROM/Strengthening   UBE (Upper Arm Bike) sitting on ball: Level 3 x 6 (3/3)min                  PT Short Term Goals - 05/27/17 1453      PT SHORT TERM GOAL #  1   Title Pt will be able to demo posture and body mechanics with housework and ADLs     PT SHORT TERM GOAL #2   Title be independent with initial HEP   Time 4   Period Weeks   Status On-going     PT SHORT TERM GOAL #3   Title report a 30% reduction in LBP with housework and ADLs   Baseline 10%   Time 4   Period Weeks   Status On-going     PT SHORT TERM GOAL #4   Title stand for 5-10 minutes for housework without need to sit down   Baseline 10 minutes   Status Achieved           PT Long Term Goals - 05/16/17 1610      PT LONG TERM GOAL #1   Title be independent in advanced HEP   Time 8   Period Weeks   Status On-going     PT LONG TERM GOAL #2   Title reduce FOTO to < or = to 50% limitation   Time 8   Period Weeks   Status On-going     PT LONG TERM GOAL #3   Title tolerate standing for > or = to 15 minutes for cooking and washing dishes without need to rest   Period Weeks   Status On-going     PT LONG TERM GOAL #4    Title perform regular walking for exercise 20-30 minutes a day   Time 8   Period Weeks   Status On-going     PT LONG TERM GOAL #5   Title report a 60% reduction in LBP and leg pain with housework and ADLs   Time 8   Period Weeks   Status On-going               Plan - 05/27/17 1455    Clinical Impression Statement Pt with limited attendance with PT due to travel and IBS flare-up.  Pt has not been compliant with HEP due to travel and not establishing a routine.  Pt with chronic history of LBP with recent exacerbation.  Pt tolerated stretching and mobility today without limitation.  Pt will benefit from skilled PT for core strength, flexibility and mobility/endurance to improve pt's tolerance for home tasks.     Rehab Potential Good   PT Frequency 2x / week   PT Duration 8 weeks   PT Treatment/Interventions ADLs/Self Care Home Management;Cryotherapy;Electrical Stimulation;Functional mobility training;Stair training;Moist Heat;Therapeutic activities;Therapeutic exercise;Neuromuscular re-education;Patient/family education;Passive range of motion;Manual techniques;Energy conservation;Taping   PT Next Visit Plan core strength, manual to gluteals, hip and lumbar flexibility   Consulted and Agree with Plan of Care Patient      Patient will benefit from skilled therapeutic intervention in order to improve the following deficits and impairments:  Postural dysfunction, Decreased strength, Improper body mechanics, Impaired flexibility, Decreased activity tolerance, Pain, Increased muscle spasms, Decreased endurance, Decreased range of motion  Visit Diagnosis: Chronic bilateral low back pain with bilateral sciatica  Stiffness of left hip, not elsewhere classified  Muscle weakness (generalized)  Stiffness of right hip, not elsewhere classified     Problem List Patient Active Problem List   Diagnosis Date Noted  . BPPV (benign paroxysmal positional vertigo) 10/22/2016  . Anxiety  state 06/22/2016  . Tachycardia 04/25/2016  . Bilateral occipital neuralgia   . Cryptogenic stroke ([place]) 04/13/2016  . Cerebral infarction ([place])   . Thyroid activity decreased   . Depression   .  Hypothyroidism   . GERD (gastroesophageal reflux disease)   . Hyperlipidemia   . Hypertension   . IBS (irritable bowel syndrome)      Sigurd Sos, PT 05/27/17 3:21 PM  [facility] [address] [place], [place], [address] Phone: [phone]   Fax:  [phone]  Name: Adine Heimann MRN: [id number] Date of Birth: May 27, 1951

## 2017-05-28 NOTE — Progress Notes (Signed)
[facility] Summary Report / Loop Recorder 

## 2017-05-30 ENCOUNTER — Encounter: Payer: Medicare Other | Admitting: Physical Therapy

## 2017-06-02 NOTE — Progress Notes (Signed)
Cardiology Office Note    Date:  06/03/2017   ID:  April Armstrong, DOB 1951-10-31, MRN [id number]  PCP:  Carol Ada, MD  Cardiologist: Sinclair Grooms, MD   Chief Complaint  Patient presents with  . Cerebrovascular Accident    History of Present Illness:  April Armstrong is a 66 y.o. female for f/u cryptogenic stroke, ILR, essential hypertension, and hyperlipidemia.  Doing well. Loop recorder has been evaluated monthly since implantation without noting any evidence of atrial fibrillation or significant atrial arrhythmia. She is progressing well and physical therapy. Overall activity level is increasing. She denies orthopnea, PND, and syncope.  Sensation of continuous tightness in the chest starting at 11 AM today. No change in EKG compared to prior. No change in quality or intensity with walking. No associated dyspnea.  Bilateral lower extremity ankle to shin edema. Denies orthopnea and exertional dyspnea.  Past Medical History:  Diagnosis Date  . Depression   . GERD (gastroesophageal reflux disease)   . Hypercholesterolemia   . Hypertension   . Hypothyroidism   . IBS (irritable bowel syndrome)   . Stroke ([place])   . Thyroid disease     Past Surgical History:  Procedure Laterality Date  . ABDOMINAL HYSTERECTOMY    . APPENDECTOMY    . CESAREAN SECTION    . CHOLECYSTECTOMY    . EP IMPLANTABLE DEVICE N/A 12/28/2016   Procedure: Loop Recorder Insertion;  Surgeon: Evans Lance, MD;  Location: [facility];  Service: Cardiovascular;  Laterality: N/A;  . THYROIDECTOMY    . TRIGGER FINGER RELEASE      Current Medications: Outpatient Medications Prior to Visit  Medication Sig Dispense Refill  . aspirin 325 MG tablet Take 1 tablet (325 mg total) by mouth daily.    Marland Kitchen atorvastatin (LIPITOR) 80 MG tablet Take 1 tablet (80 mg total) by mouth at bedtime. 30 tablet 0  . buPROPion (WELLBUTRIN XL) 300 MG 24 hr tablet Take 300 mg by mouth daily.     Marland Kitchen ibuprofen  (ADVIL,MOTRIN) 200 MG tablet Take 800 mg by mouth every 6 (six) hours as needed for moderate pain.    Marland Kitchen lamoTRIgine (LAMICTAL) 100 MG tablet Take 100 mg by mouth daily.     Marland Kitchen levothyroxine (SYNTHROID, LEVOTHROID) 112 MCG tablet Take 112 mcg by mouth daily before breakfast.    . loratadine (CLARITIN) 10 MG tablet Take 10 mg by mouth daily as needed for allergies.     Marland Kitchen LORazepam (ATIVAN) 0.5 MG tablet Take 0.5 mg by mouth every 6 (six) hours as needed for anxiety.     . metoprolol succinate (TOPROL-XL) 25 MG 24 hr tablet Take 25 mg by mouth daily.     Marland Kitchen omeprazole (PRILOSEC) 40 MG capsule Take 40 mg by mouth daily.     . Prenatal Vit-Fe Fumarate-FA (PRENATAL MULTIVITAMIN) TABS tablet Take 1 tablet by mouth daily at 12 noon.    . RESTASIS 0.05 % ophthalmic emulsion Place 1 drop into both eyes 2 (two) times daily.     . sertraline (ZOLOFT) 100 MG tablet Take 100 mg by mouth daily.     Marland Kitchen spironolactone (ALDACTONE) 25 MG tablet Take 25 mg by mouth daily.     . traZODone (DESYREL) 150 MG tablet Take 75 mg by mouth at bedtime.     . vitamin B-12 (CYANOCOBALAMIN) 100 MCG tablet Take 100 mcg by mouth at bedtime.    . gabapentin (NEURONTIN) 300 MG capsule Take 1 capsule by mouth  at night for 7 nights and then 1 in the morning and one at night for 7 days and then 3 times a day. (Patient not taking: Reported on 06/03/2017) 90 capsule 0  . acetaminophen-codeine (TYLENOL #3) 300-30 MG tablet Take 1 tablet by mouth every 8 (eight) hours as needed for moderate pain. (Patient not taking: Reported on 06/03/2017) 40 tablet 0  . GARCINIA CAMBOGIA-CHROMIUM PO Take 2 tablets by mouth daily.    Marland Kitchen OVER THE COUNTER MEDICATION Take 1 capsule by mouth daily. Med Name: [place]     No facility-administered medications prior to visit.      Allergies:   Patient has no known allergies.   Social History   Social History  . Marital status: Married    Spouse name: N/A  . Number of children: N/A  .  Years of education: N/A   Social History Main Topics  . Smoking status: Never Smoker  . Smokeless tobacco: Never Used  . Alcohol use No  . Drug use: No  . Sexual activity: Not Asked   Other Topics Concern  . None   Social History Narrative  . None     Family History:  The patient's family history includes Congestive Heart Failure in her sister; Dementia in her mother; Heart disease in her brother and father; Hypercholesterolemia in her mother; Stroke in her father.   ROS:   Please see the history of present illness.    Chronic pain syndrome for which she she sees Dr. Ernestina Patches. Diarrhea, depression, back pain, muscle pain, easy bruising, anxiety, snoring, wheezing, vision disturbance, and leg swelling. Excessive fatigue.  All other systems reviewed and are negative.   PHYSICAL EXAM:   VS:  BP 116/78 (BP Location: Left Arm)   Pulse 64   Ht 5' 2.75" (1.594 m)   Wt 231 lb (104.8 kg)   BMI 41.25 kg/m    GEN: Well nourished, well developed, in no acute distress . Moderate to severe obesity. HEENT: normal  Neck: no JVD, carotid bruits, or masses Cardiac: RRR; no murmurs, rubs, or gallops. Superficial and deep varicosities. 1+ ankle to mid shin bilateral edema . Respiratory:  clear to auscultation bilaterally, normal work of breathing GI: soft, nontender, nondistended, + BS MS: no deformity or atrophy  Skin: warm and dry, no rash Neuro:  Alert and Oriented x 3, Strength and sensation are intact Psych: euthymic mood, full affect  Wt Readings from Last 3 Encounters:  06/03/17 231 lb (104.8 kg)  12/28/16 222 lb (100.7 kg)  12/07/16 222 lb (100.7 kg)      Studies/Labs Reviewed:   EKG:  EKG  Normal sinus rhythm with nonspecific T-wave abnormality, unchanged from tracing in 2017.  Recent Labs: No results found for requested labs within last 8760 hours.   Lipid Panel    Component Value Date/Time   CHOL 167 04/13/2016 0512   TRIG 172 (H) 04/13/2016 0512   HDL 41 04/13/2016  0512   CHOLHDL 4.1 04/13/2016 0512   VLDL 34 04/13/2016 0512   LDLCALC 92 04/13/2016 0512    Additional studies/ records that were reviewed today include:  Implantable Loop Recorder has not demonstrated any significant arrhythmia    ASSESSMENT:    1. Essential hypertension   2. Other hyperlipidemia   3. Cryptogenic stroke ([place])   4. Tachycardia   5. Bilateral leg edema   6. Chest discomfort      PLAN:  In order of problems listed above:  1. Blood pressure is well controlled. 2 g sodium diet is recommended. 2. Target LDL cholesterol should be less than 70 given history of CVA. 3. Looking for evidence of arrhythmia to account for embolic CVA. No abnormalities noted this point. 4. No significant tachycardias been identified. 5. Bilateral edema in the lower extremities is felt to be related to venous insufficiency. Her weight is up 10 pounds. If edema progresses may need to consider low-dose diuretic therapy. We have recommended knee-high moderate compression stockings. 6. Low risk myocardial perfusion study in January. EKG today shows no change compared to prior tracings. No further workup at this time. Continued in physical therapy.    Medication Adjustments/Labs and Tests Ordered: Current medicines are reviewed at length with the patient today.  Concerns regarding medicines are outlined above.  Medication changes, Labs and Tests ordered today are listed in the Patient Instructions below. Patient Instructions  Medication Instructions:  None  Labwork: None  Testing/Procedures: None  Follow-Up: Your physician wants you to follow-up in: 1 year with Dr. Tamala Julian.  You will receive a reminder letter in the mail two months in advance. If you don't receive a letter, please call our office to schedule the follow-up appointment.   Any Other Special Instructions Will Be Listed Below (If Applicable).     If you need a refill on your cardiac medications before your next  appointment, please call your pharmacy.      Signed, Sinclair Grooms, MD  06/03/2017 3:23 PM    [facility] [place], [place], [place]  [address] Phone: [phone]; Fax: [phone]

## 2017-06-03 ENCOUNTER — Ambulatory Visit (INDEPENDENT_AMBULATORY_CARE_PROVIDER_SITE_OTHER): Payer: Medicare Other | Admitting: Interventional Cardiology

## 2017-06-03 ENCOUNTER — Encounter: Payer: Self-pay | Admitting: Interventional Cardiology

## 2017-06-03 VITALS — BP 116/78 | HR 64 | Ht 62.75 in | Wt 231.0 lb

## 2017-06-03 DIAGNOSIS — E7849 Other hyperlipidemia: Secondary | ICD-10-CM

## 2017-06-03 DIAGNOSIS — I639 Cerebral infarction, unspecified: Secondary | ICD-10-CM | POA: Diagnosis not present

## 2017-06-03 DIAGNOSIS — R0789 Other chest pain: Secondary | ICD-10-CM | POA: Diagnosis not present

## 2017-06-03 DIAGNOSIS — E784 Other hyperlipidemia: Secondary | ICD-10-CM | POA: Diagnosis not present

## 2017-06-03 DIAGNOSIS — R600 Localized edema: Secondary | ICD-10-CM | POA: Diagnosis not present

## 2017-06-03 DIAGNOSIS — I10 Essential (primary) hypertension: Secondary | ICD-10-CM | POA: Diagnosis not present

## 2017-06-03 DIAGNOSIS — R000 Tachycardia, unspecified: Secondary | ICD-10-CM | POA: Diagnosis not present

## 2017-06-03 NOTE — Patient Instructions (Signed)

## 2017-06-04 ENCOUNTER — Ambulatory Visit: Payer: Medicare Other

## 2017-06-04 DIAGNOSIS — M6281 Muscle weakness (generalized): Secondary | ICD-10-CM | POA: Diagnosis not present

## 2017-06-04 DIAGNOSIS — G8929 Other chronic pain: Secondary | ICD-10-CM | POA: Diagnosis not present

## 2017-06-04 DIAGNOSIS — M5441 Lumbago with sciatica, right side: Secondary | ICD-10-CM | POA: Diagnosis not present

## 2017-06-04 DIAGNOSIS — M25652 Stiffness of left hip, not elsewhere classified: Secondary | ICD-10-CM | POA: Diagnosis not present

## 2017-06-04 DIAGNOSIS — M25651 Stiffness of right hip, not elsewhere classified: Secondary | ICD-10-CM | POA: Diagnosis not present

## 2017-06-04 DIAGNOSIS — M5442 Lumbago with sciatica, left side: Secondary | ICD-10-CM | POA: Diagnosis not present

## 2017-06-04 NOTE — Therapy (Signed)
[facility] [address] [place], [place], [address] Phone: [phone]   Fax:  [phone]  Physical Therapy Treatment  Patient Details  Name: April Armstrong MRN: [id number] Date of Birth: 01-23-1951 Referring Provider: Magnus Sinning, MD  Encounter Date: 06/04/2017      PT End of Session - 06/04/17 1540    Visit Number 5   Number of Visits 10   Date for PT Re-Evaluation 06/19/17   PT Start Time 0539   PT Stop Time 1615   PT Time Calculation (min) 41 min   Activity Tolerance Patient tolerated treatment well   Behavior During Therapy [facility] for tasks assessed/performed      Past Medical History:  Diagnosis Date  . Depression   . GERD (gastroesophageal reflux disease)   . Hypercholesterolemia   . Hypertension   . Hypothyroidism   . IBS (irritable bowel syndrome)   . Stroke ([place])   . Thyroid disease     Past Surgical History:  Procedure Laterality Date  . ABDOMINAL HYSTERECTOMY    . APPENDECTOMY    . CESAREAN SECTION    . CHOLECYSTECTOMY    . EP IMPLANTABLE DEVICE N/A 12/28/2016   Procedure: Loop Recorder Insertion;  Surgeon: Evans Lance, MD;  Location: [place] [facility];  Service: Cardiovascular;  Laterality: N/A;  . THYROIDECTOMY    . TRIGGER FINGER RELEASE      There were no vitals filed for this visit.      Subjective Assessment - 06/04/17 1537    Subjective Pt. reporting she had a "cramping" pain in chest yesterday and saw cardiologist regarding this for routine check yesterday and MD reporting no change with echocardiogram.   Patient Stated Goals walk for exercise, stand longer to do housework, be more active   Currently in Pain? No/denies   Pain Score 0-No pain   Multiple Pain Sites No                         [facility] Adult PT Treatment/Exercise - 06/04/17 1550      Lumbar Exercises: Stretches   Passive Hamstring Stretch 2 reps;30 seconds   Passive Hamstring Stretch  Limitations bil; strap   Single Knee to Chest Stretch 2 reps;30 seconds   Lower Trunk Rotation 3 reps;20 seconds   Piriformis Stretch 2 reps;30 seconds   Piriformis Stretch Limitations with strap      Lumbar Exercises: Standing   Heel Raises 20 reps;3 seconds   Heel Raises Limitations at counter      Lumbar Exercises: Seated   Hip Flexion on Ball Right;Left;15 reps   Hip Flexion on Ball Limitations Seated march on blue p-ball    Sit to Stand 20 reps   Sit to Stand Limitations from blue p-ball   focusing on slow eccentric     Knee/Hip Exercises: Aerobic   Nustep Level 1x 10 minutes     Shoulder Exercises: ROM/Strengthening   UBE (Upper Arm Bike) sitting on ball: Level 3 x 6 (3/3)min                  PT Short Term Goals - 05/27/17 1453      PT SHORT TERM GOAL #1   Title Pt will be able to demo posture and body mechanics with housework and ADLs     PT SHORT TERM GOAL #2   Title be independent with initial HEP   Time 4   Period Weeks  Status On-going     PT SHORT TERM GOAL #3   Title report a 30% reduction in LBP with housework and ADLs   Baseline 10%   Time 4   Period Weeks   Status On-going     PT SHORT TERM GOAL #4   Title stand for 5-10 minutes for housework without need to sit down   Baseline 10 minutes   Status Achieved           PT Long Term Goals - 05/16/17 1610      PT LONG TERM GOAL #1   Title be independent in advanced HEP   Time 8   Period Weeks   Status On-going     PT LONG TERM GOAL #2   Title reduce FOTO to < or = to 50% limitation   Time 8   Period Weeks   Status On-going     PT LONG TERM GOAL #3   Title tolerate standing for > or = to 15 minutes for cooking and washing dishes without need to rest   Period Weeks   Status On-going     PT LONG TERM GOAL #4   Title perform regular walking for exercise 20-30 minutes a day   Time 8   Period Weeks   Status On-going     PT LONG TERM GOAL #5   Title report a 60% reduction  in LBP and leg pain with housework and ADLs   Time 8   Period Weeks   Status On-going               Plan - 06/04/17 1543    Clinical Impression Statement Pt. noting most pain while shaving legs in shower and unloading dishwasher.  Further discussion of walking program today.  Pt. currently walking 2x/day to mail box and feels she is able to walk further daily.  Had some chest pain yesterday however saw MD regarding this yesterday afternoon with negative EKG and no complications.  Performed well with all lumbopelvic strengthening and stretching therex today.  Seems to be progressing well.     PT Treatment/Interventions ADLs/Self Care Home Management;Cryotherapy;Electrical Stimulation;Functional mobility training;Stair training;Moist Heat;Therapeutic activities;Therapeutic exercise;Neuromuscular re-education;Patient/family education;Passive range of motion;Manual techniques;Energy conservation;Taping   PT Next Visit Plan Core strength, manual to gluteals, hip and lumbar flexibility      Patient will benefit from skilled therapeutic intervention in order to improve the following deficits and impairments:  Postural dysfunction, Decreased strength, Improper body mechanics, Impaired flexibility, Decreased activity tolerance, Pain, Increased muscle spasms, Decreased endurance, Decreased range of motion  Visit Diagnosis: Chronic bilateral low back pain with bilateral sciatica  Stiffness of left hip, not elsewhere classified  Muscle weakness (generalized)  Stiffness of right hip, not elsewhere classified     Problem List Patient Active Problem List   Diagnosis Date Noted  . BPPV (benign paroxysmal positional vertigo) 10/22/2016  . Anxiety state 06/22/2016  . Tachycardia 04/25/2016  . Bilateral occipital neuralgia   . Cryptogenic stroke ([place]) 04/13/2016  . Cerebral infarction ([place])   . Thyroid activity decreased   . Depression   . Hypothyroidism   . GERD (gastroesophageal reflux  disease)   . Hyperlipidemia   . Hypertension   . IBS (irritable bowel syndrome)     April Armstrong, PTA 06/04/17 4:46 PM   [facility] [address] [place], [place], [address] Phone: [phone]   Fax:  [phone]  Name: April Armstrong MRN: [id number] Date of Birth: 11/07/1951

## 2017-06-05 DIAGNOSIS — D0461 Carcinoma in situ of skin of right upper limb, including shoulder: Secondary | ICD-10-CM | POA: Diagnosis not present

## 2017-06-06 ENCOUNTER — Ambulatory Visit: Payer: Medicare Other | Admitting: Physical Therapy

## 2017-06-06 DIAGNOSIS — M5442 Lumbago with sciatica, left side: Secondary | ICD-10-CM | POA: Diagnosis not present

## 2017-06-06 DIAGNOSIS — M25652 Stiffness of left hip, not elsewhere classified: Secondary | ICD-10-CM | POA: Diagnosis not present

## 2017-06-06 DIAGNOSIS — M25651 Stiffness of right hip, not elsewhere classified: Secondary | ICD-10-CM

## 2017-06-06 DIAGNOSIS — M5441 Lumbago with sciatica, right side: Principal | ICD-10-CM

## 2017-06-06 DIAGNOSIS — G8929 Other chronic pain: Secondary | ICD-10-CM | POA: Diagnosis not present

## 2017-06-06 DIAGNOSIS — M6281 Muscle weakness (generalized): Secondary | ICD-10-CM

## 2017-06-06 LAB — CUP PACEART REMOTE DEVICE CHECK
MDC IDC PG IMPLANT DT: 20180202
MDC IDC SESS DTM: 20180702194354

## 2017-06-06 NOTE — Therapy (Addendum)
[facility] [address], [place], [place], [address] Phone: [phone]   Fax:  [phone]  Physical Therapy Treatment/Discharge Summary  Patient Details  Name: April Armstrong MRN: [id number] Date of Birth: [birth year] Referring Provider: Magnus Sinning, MD  Encounter Date: 06/06/2017      PT End of Session - 06/06/17 1602    Visit Number 6   Number of Visits 10   Date for PT Re-Evaluation 06/19/17   PT Start Time 3419   PT Stop Time 6222   PT Time Calculation (min) 44 min   Activity Tolerance Patient tolerated treatment well      Past Medical History:  Diagnosis Date  . Depression   . GERD (gastroesophageal reflux disease)   . Hypercholesterolemia   . Hypertension   . Hypothyroidism   . IBS (irritable bowel syndrome)   . Stroke ([place])   . Thyroid disease     Past Surgical History:  Procedure Laterality Date  . ABDOMINAL HYSTERECTOMY    . APPENDECTOMY    . CESAREAN SECTION    . CHOLECYSTECTOMY    . EP IMPLANTABLE DEVICE N/A 12/28/2016   Procedure: Loop Recorder Insertion;  Surgeon: Evans Lance, MD;  Location: [place] [facility];  Service: Cardiovascular;  Laterality: N/A;  . THYROIDECTOMY    . TRIGGER FINGER RELEASE      There were no vitals filed for this visit.      Subjective Assessment - 06/06/17 1531    Subjective My leg has been sore to touch.  The heart doctor recommended compression stockings for chronic swelling.  I love my therapy here.  I"m tired when I leave here but I feel better.  I use heat on my back.  Overall 25% better.  I'm off the sofa and moving around more now.     Currently in Pain? Yes   Pain Score 9    Pain Location Leg   Pain Orientation Right   Pain Type Chronic pain   Aggravating Factors  raising leg to get into the car;  crossing my legs is impossible                          [facility] Adult PT Treatment/Exercise - 06/06/17 0001      Therapeutic  Activites    Other Therapeutic Activities walking, standing, sitting     Neuro Re-ed    Neuro Re-ed Details  transverse abdominus activation     Lumbar Exercises: Stretches   Active Hamstring Stretch 5 reps   Active Hamstring Stretch Limitations supine with strap with side to side bias     Lumbar Exercises: Seated   Sit to Stand 10 reps   Sit to Stand Limitations abdominal activation push down on roll 10x     Lumbar Exercises: Supine   Ab Set 10 reps;5 seconds   Clam 20 reps   Clam Limitations double and single leg green band   Isometric Hip Flexion 5 reps     Knee/Hip Exercises: Aerobic   Nustep Level 2x 10 minutes     Knee/Hip Exercises: Seated   Long Arc Quad Strengthening;Right;Left;10 reps   Long Arc Quad Limitations red band   Other Seated Knee/Hip Exercises red band hip flexion 10x                   PT Short Term Goals - 06/06/17 1724      PT SHORT TERM GOAL #1  Title Pt will be able to demo posture and body mechanics with housework and ADLs   Status Achieved     PT SHORT TERM GOAL #2   Title be independent with initial HEP   Status Achieved     PT SHORT TERM GOAL #3   Title report a 30% reduction in LBP with housework and ADLs   Time 4   Period Weeks   Status Partially Met     PT SHORT TERM GOAL #4   Title stand for 5-10 minutes for housework without need to sit down   Status Achieved           PT Long Term Goals - 06/06/17 1725      PT LONG TERM GOAL #1   Title be independent in advanced HEP   Time 8   Period Weeks   Status On-going     PT LONG TERM GOAL #2   Title reduce FOTO to < or = to 50% limitation   Time 8   Period Weeks   Status On-going     PT LONG TERM GOAL #3   Title tolerate standing for > or = to 15 minutes for cooking and washing dishes without need to rest   Time 8   Period Weeks   Status On-going     PT LONG TERM GOAL #4   Title perform regular walking for exercise 20-30 minutes a day   Time 8   Period  Weeks   Status On-going     PT LONG TERM GOAL #5   Title report a 60% reduction in LBP and leg pain with housework and ADLs   Time 8   Period Weeks   Status On-going               Plan - 06/06/17 1602    Clinical Impression Statement The patient's primary complaint is right lower leg pain rather than her referrring diagnosis of LBP and hip pain.  Initially she reports her pain is 25% better but following treatment session , she states she is 75% better.  Majority of STGS met.   Following treatment session she reports no lower leg pain or back/hip pain (initially 9/10 pain).  She would like to follow up with Dr. Ernestina Patches next week and then may be traveling to [place] but patient plans to call to continue with PT.  She will need a recertification if beyond 7/25.   PT Frequency 2x / week   PT Duration 8 weeks   PT Treatment/Interventions ADLs/Self Care Home Management;Cryotherapy;Electrical Stimulation;Functional mobility training;Stair training;Moist Heat;Therapeutic activities;Therapeutic exercise;Neuromuscular re-education;Patient/family education;Passive range of motion;Manual techniques;Energy conservation;Taping   PT Next Visit Plan Core strength, manual to gluteals, hip and lumbar flexibility;  ERO 06/19/17      Patient will benefit from skilled therapeutic intervention in order to improve the following deficits and impairments:  Postural dysfunction, Decreased strength, Improper body mechanics, Impaired flexibility, Decreased activity tolerance, Pain, Increased muscle spasms, Decreased endurance, Decreased range of motion  Visit Diagnosis: Chronic bilateral low back pain with bilateral sciatica  Stiffness of left hip, not elsewhere classified  Muscle weakness (generalized)  Stiffness of right hip, not elsewhere classified  PHYSICAL THERAPY DISCHARGE SUMMARY  Visits from Start of Care: 6  Current functional level related to goals / functional outcomes: The patient  traveled out of state and has not called to resume PT services as planned and her certification has ended.  Chart has been inactive for 2 months.  Will discharge  at this time.     Remaining deficits: See above   Education / Equipment: Initial HEP Plan:                                                    Patient goals were not met. Patient is being discharged due to not returning since the last visit.  ?????    G code:  Other Primary  Goal CK  Discharge CL      Problem List Patient Active Problem List   Diagnosis Date Noted  . BPPV (benign paroxysmal positional vertigo) 10/22/2016  . Anxiety state 06/22/2016  . Tachycardia 04/25/2016  . Bilateral occipital neuralgia   . Cryptogenic stroke ([place]) 04/13/2016  . Cerebral infarction ([place])   . Thyroid activity decreased   . Depression   . Hypothyroidism   . GERD (gastroesophageal reflux disease)   . Hyperlipidemia   . Hypertension   . IBS (irritable bowel syndrome)    Ruben Im, PT 06/06/17 5:27 PM Phone: [phone] Fax: [phone]  Alvera Singh 06/06/2017, 5:27 PM  [facility] [address], [place], [place], [address] Phone: [phone]   Fax:  [phone]  Name: Stayce Delancy MRN: [id number] Date of Birth: May 17, 1951

## 2017-06-18 DIAGNOSIS — D2271 Melanocytic nevi of right lower limb, including hip: Secondary | ICD-10-CM | POA: Diagnosis not present

## 2017-06-18 DIAGNOSIS — L851 Acquired keratosis [keratoderma] palmaris et plantaris: Secondary | ICD-10-CM | POA: Diagnosis not present

## 2017-06-18 DIAGNOSIS — D225 Melanocytic nevi of trunk: Secondary | ICD-10-CM | POA: Diagnosis not present

## 2017-06-18 DIAGNOSIS — Z85828 Personal history of other malignant neoplasm of skin: Secondary | ICD-10-CM | POA: Diagnosis not present

## 2017-06-18 DIAGNOSIS — L821 Other seborrheic keratosis: Secondary | ICD-10-CM | POA: Diagnosis not present

## 2017-06-18 DIAGNOSIS — L570 Actinic keratosis: Secondary | ICD-10-CM | POA: Diagnosis not present

## 2017-06-21 DIAGNOSIS — I83813 Varicose veins of bilateral lower extremities with pain: Secondary | ICD-10-CM | POA: Diagnosis not present

## 2017-06-21 DIAGNOSIS — I83893 Varicose veins of bilateral lower extremities with other complications: Secondary | ICD-10-CM | POA: Diagnosis not present

## 2017-06-26 ENCOUNTER — Ambulatory Visit (INDEPENDENT_AMBULATORY_CARE_PROVIDER_SITE_OTHER): Payer: Medicare Other | Admitting: *Deleted

## 2017-06-26 DIAGNOSIS — I639 Cerebral infarction, unspecified: Secondary | ICD-10-CM

## 2017-06-27 NOTE — Progress Notes (Signed)
[facility] Summary Report / Loop Recorder 

## 2017-07-01 ENCOUNTER — Ambulatory Visit: Payer: Medicare Other | Admitting: Neurology

## 2017-07-03 DIAGNOSIS — I8311 Varicose veins of right lower extremity with inflammation: Secondary | ICD-10-CM | POA: Diagnosis not present

## 2017-07-08 LAB — CUP PACEART REMOTE DEVICE CHECK
Implantable Pulse Generator Implant Date: 20180202
MDC IDC SESS DTM: 20180801200943

## 2017-07-09 DIAGNOSIS — I8312 Varicose veins of left lower extremity with inflammation: Secondary | ICD-10-CM | POA: Diagnosis not present

## 2017-07-10 DIAGNOSIS — I83893 Varicose veins of bilateral lower extremities with other complications: Secondary | ICD-10-CM | POA: Diagnosis not present

## 2017-07-10 DIAGNOSIS — I8312 Varicose veins of left lower extremity with inflammation: Secondary | ICD-10-CM | POA: Diagnosis not present

## 2017-07-10 DIAGNOSIS — I83813 Varicose veins of bilateral lower extremities with pain: Secondary | ICD-10-CM | POA: Diagnosis not present

## 2017-07-10 DIAGNOSIS — I8311 Varicose veins of right lower extremity with inflammation: Secondary | ICD-10-CM | POA: Diagnosis not present

## 2017-07-18 DIAGNOSIS — K219 Gastro-esophageal reflux disease without esophagitis: Secondary | ICD-10-CM | POA: Diagnosis not present

## 2017-07-18 DIAGNOSIS — Z23 Encounter for immunization: Secondary | ICD-10-CM | POA: Diagnosis not present

## 2017-07-18 DIAGNOSIS — Z6841 Body Mass Index (BMI) 40.0 and over, adult: Secondary | ICD-10-CM | POA: Diagnosis not present

## 2017-07-18 DIAGNOSIS — Z0000 Encounter for general adult medical examination without abnormal findings: Secondary | ICD-10-CM | POA: Diagnosis not present

## 2017-07-18 DIAGNOSIS — F339 Major depressive disorder, recurrent, unspecified: Secondary | ICD-10-CM | POA: Diagnosis not present

## 2017-07-18 DIAGNOSIS — E7800 Pure hypercholesterolemia, unspecified: Secondary | ICD-10-CM | POA: Diagnosis not present

## 2017-07-18 DIAGNOSIS — Z1389 Encounter for screening for other disorder: Secondary | ICD-10-CM | POA: Diagnosis not present

## 2017-07-18 DIAGNOSIS — E890 Postprocedural hypothyroidism: Secondary | ICD-10-CM | POA: Diagnosis not present

## 2017-07-18 DIAGNOSIS — G4700 Insomnia, unspecified: Secondary | ICD-10-CM | POA: Diagnosis not present

## 2017-07-18 DIAGNOSIS — I10 Essential (primary) hypertension: Secondary | ICD-10-CM | POA: Diagnosis not present

## 2017-07-26 ENCOUNTER — Ambulatory Visit (INDEPENDENT_AMBULATORY_CARE_PROVIDER_SITE_OTHER): Payer: Medicare Other | Admitting: *Deleted

## 2017-07-26 DIAGNOSIS — I639 Cerebral infarction, unspecified: Secondary | ICD-10-CM | POA: Diagnosis not present

## 2017-07-31 NOTE — Progress Notes (Signed)
[facility] Summary Report / Loop Recorder 

## 2017-08-01 LAB — CUP PACEART REMOTE DEVICE CHECK
Implantable Pulse Generator Implant Date: 20180202
MDC IDC SESS DTM: 20180831201158

## 2017-08-14 ENCOUNTER — Ambulatory Visit: Payer: Medicare Other | Admitting: Neurology

## 2017-08-21 DIAGNOSIS — I83891 Varicose veins of right lower extremities with other complications: Secondary | ICD-10-CM | POA: Diagnosis not present

## 2017-08-21 DIAGNOSIS — I8311 Varicose veins of right lower extremity with inflammation: Secondary | ICD-10-CM | POA: Diagnosis not present

## 2017-08-26 ENCOUNTER — Ambulatory Visit (INDEPENDENT_AMBULATORY_CARE_PROVIDER_SITE_OTHER): Payer: Medicare Other | Admitting: *Deleted

## 2017-08-26 DIAGNOSIS — I639 Cerebral infarction, unspecified: Secondary | ICD-10-CM

## 2017-08-26 NOTE — Progress Notes (Signed)
[facility] Summary Report / Loop Recorder 

## 2017-08-28 LAB — CUP PACEART REMOTE DEVICE CHECK
Date Time Interrogation Session: 20180930221431
MDC IDC PG IMPLANT DT: 20180202

## 2017-09-04 DIAGNOSIS — I83891 Varicose veins of right lower extremities with other complications: Secondary | ICD-10-CM | POA: Diagnosis not present

## 2017-09-04 DIAGNOSIS — I8311 Varicose veins of right lower extremity with inflammation: Secondary | ICD-10-CM | POA: Diagnosis not present

## 2017-09-24 ENCOUNTER — Ambulatory Visit (INDEPENDENT_AMBULATORY_CARE_PROVIDER_SITE_OTHER): Payer: Medicare Other | Admitting: *Deleted

## 2017-09-24 ENCOUNTER — Ambulatory Visit: Payer: Medicare Other | Admitting: Neurology

## 2017-09-24 DIAGNOSIS — I639 Cerebral infarction, unspecified: Secondary | ICD-10-CM | POA: Diagnosis not present

## 2017-09-26 NOTE — Progress Notes (Signed)
[facility] Summary Report / Loop Recorder 

## 2017-09-27 LAB — CUP PACEART REMOTE DEVICE CHECK
Implantable Pulse Generator Implant Date: 20180202
MDC IDC SESS DTM: 20181030224009

## 2017-10-08 ENCOUNTER — Ambulatory Visit: Payer: Medicare Other | Admitting: Neurology

## 2017-10-08 ENCOUNTER — Telehealth: Payer: Self-pay

## 2017-10-08 NOTE — Telephone Encounter (Signed)
Patient no show for appt today. 

## 2017-10-09 ENCOUNTER — Encounter: Payer: Self-pay | Admitting: Neurology

## 2017-10-10 DIAGNOSIS — I83891 Varicose veins of right lower extremities with other complications: Secondary | ICD-10-CM | POA: Diagnosis not present

## 2017-10-10 DIAGNOSIS — I83811 Varicose veins of right lower extremities with pain: Secondary | ICD-10-CM | POA: Diagnosis not present

## 2017-10-10 DIAGNOSIS — I8311 Varicose veins of right lower extremity with inflammation: Secondary | ICD-10-CM | POA: Diagnosis not present

## 2017-10-24 ENCOUNTER — Ambulatory Visit (INDEPENDENT_AMBULATORY_CARE_PROVIDER_SITE_OTHER): Payer: Medicare Other | Admitting: *Deleted

## 2017-10-24 DIAGNOSIS — I639 Cerebral infarction, unspecified: Secondary | ICD-10-CM | POA: Diagnosis not present

## 2017-10-25 NOTE — Progress Notes (Signed)
[facility] Summary Report / Loop Recorder 

## 2017-11-01 DIAGNOSIS — I8312 Varicose veins of left lower extremity with inflammation: Secondary | ICD-10-CM | POA: Diagnosis not present

## 2017-11-05 LAB — CUP PACEART REMOTE DEVICE CHECK
Implantable Pulse Generator Implant Date: 20180202
MDC IDC SESS DTM: 20181129231056

## 2017-11-12 DIAGNOSIS — I83892 Varicose veins of left lower extremities with other complications: Secondary | ICD-10-CM | POA: Diagnosis not present

## 2017-11-12 DIAGNOSIS — I8312 Varicose veins of left lower extremity with inflammation: Secondary | ICD-10-CM | POA: Diagnosis not present

## 2017-11-15 DIAGNOSIS — I8312 Varicose veins of left lower extremity with inflammation: Secondary | ICD-10-CM | POA: Diagnosis not present

## 2017-11-25 ENCOUNTER — Ambulatory Visit (INDEPENDENT_AMBULATORY_CARE_PROVIDER_SITE_OTHER): Payer: Medicare Other | Admitting: *Deleted

## 2017-11-25 DIAGNOSIS — I639 Cerebral infarction, unspecified: Secondary | ICD-10-CM

## 2017-11-25 NOTE — Progress Notes (Signed)
[facility] Summary Report / Loop Recorder 

## 2017-11-26 HISTORY — PX: OTHER SURGICAL HISTORY: SHX169

## 2017-12-05 LAB — CUP PACEART REMOTE DEVICE CHECK
Date Time Interrogation Session: 20181230003952
MDC IDC PG IMPLANT DT: 20180202

## 2017-12-23 ENCOUNTER — Ambulatory Visit (INDEPENDENT_AMBULATORY_CARE_PROVIDER_SITE_OTHER): Payer: Medicare Other | Admitting: *Deleted

## 2017-12-23 DIAGNOSIS — I639 Cerebral infarction, unspecified: Secondary | ICD-10-CM

## 2017-12-24 NOTE — Progress Notes (Signed)
[facility] Summary Report / Loop Recorder 

## 2017-12-25 DIAGNOSIS — I83892 Varicose veins of left lower extremities with other complications: Secondary | ICD-10-CM | POA: Diagnosis not present

## 2017-12-25 DIAGNOSIS — I8312 Varicose veins of left lower extremity with inflammation: Secondary | ICD-10-CM | POA: Diagnosis not present

## 2018-01-02 LAB — CUP PACEART REMOTE DEVICE CHECK
Implantable Pulse Generator Implant Date: 20180202
MDC IDC SESS DTM: 20190129011111

## 2018-01-13 DIAGNOSIS — I83812 Varicose veins of left lower extremities with pain: Secondary | ICD-10-CM | POA: Diagnosis not present

## 2018-01-13 DIAGNOSIS — I83892 Varicose veins of left lower extremities with other complications: Secondary | ICD-10-CM | POA: Diagnosis not present

## 2018-01-13 DIAGNOSIS — I8312 Varicose veins of left lower extremity with inflammation: Secondary | ICD-10-CM | POA: Diagnosis not present

## 2018-01-23 DIAGNOSIS — G4700 Insomnia, unspecified: Secondary | ICD-10-CM | POA: Diagnosis not present

## 2018-01-23 DIAGNOSIS — F339 Major depressive disorder, recurrent, unspecified: Secondary | ICD-10-CM | POA: Diagnosis not present

## 2018-01-23 DIAGNOSIS — Z13820 Encounter for screening for osteoporosis: Secondary | ICD-10-CM | POA: Diagnosis not present

## 2018-01-23 DIAGNOSIS — E7800 Pure hypercholesterolemia, unspecified: Secondary | ICD-10-CM | POA: Diagnosis not present

## 2018-01-23 DIAGNOSIS — Z6841 Body Mass Index (BMI) 40.0 and over, adult: Secondary | ICD-10-CM | POA: Diagnosis not present

## 2018-01-23 DIAGNOSIS — K219 Gastro-esophageal reflux disease without esophagitis: Secondary | ICD-10-CM | POA: Diagnosis not present

## 2018-01-23 DIAGNOSIS — E890 Postprocedural hypothyroidism: Secondary | ICD-10-CM | POA: Diagnosis not present

## 2018-01-23 DIAGNOSIS — I10 Essential (primary) hypertension: Secondary | ICD-10-CM | POA: Diagnosis not present

## 2018-01-27 ENCOUNTER — Ambulatory Visit (INDEPENDENT_AMBULATORY_CARE_PROVIDER_SITE_OTHER): Payer: Medicare Other | Admitting: *Deleted

## 2018-01-27 DIAGNOSIS — I639 Cerebral infarction, unspecified: Secondary | ICD-10-CM

## 2018-01-27 NOTE — Progress Notes (Signed)
[facility] Summary Report / Loop Recorder 

## 2018-01-30 DIAGNOSIS — I8312 Varicose veins of left lower extremity with inflammation: Secondary | ICD-10-CM | POA: Diagnosis not present

## 2018-01-30 DIAGNOSIS — I83892 Varicose veins of left lower extremities with other complications: Secondary | ICD-10-CM | POA: Diagnosis not present

## 2018-02-18 ENCOUNTER — Telehealth: Payer: Self-pay | Admitting: Cardiology

## 2018-02-18 NOTE — Telephone Encounter (Signed)
LMOVM requesting that pt send manual transmission b/c home monitor has not updated in at least 14 days.    

## 2018-02-25 ENCOUNTER — Encounter: Payer: Self-pay | Admitting: Cardiology

## 2018-02-27 ENCOUNTER — Ambulatory Visit (INDEPENDENT_AMBULATORY_CARE_PROVIDER_SITE_OTHER): Payer: Medicare Other | Admitting: *Deleted

## 2018-02-27 DIAGNOSIS — I639 Cerebral infarction, unspecified: Secondary | ICD-10-CM | POA: Diagnosis not present

## 2018-03-03 NOTE — Progress Notes (Signed)
[facility] Summary Report / Loop Recorder 

## 2018-03-04 LAB — CUP PACEART REMOTE DEVICE CHECK
Date Time Interrogation Session: 20190303013849
Implantable Pulse Generator Implant Date: 20180202

## 2018-03-05 ENCOUNTER — Other Ambulatory Visit: Payer: Self-pay | Admitting: Family Medicine

## 2018-03-05 DIAGNOSIS — Z1231 Encounter for screening mammogram for malignant neoplasm of breast: Secondary | ICD-10-CM

## 2018-03-10 ENCOUNTER — Other Ambulatory Visit (INDEPENDENT_AMBULATORY_CARE_PROVIDER_SITE_OTHER): Payer: Self-pay | Admitting: Radiology

## 2018-03-10 ENCOUNTER — Encounter (INDEPENDENT_AMBULATORY_CARE_PROVIDER_SITE_OTHER): Payer: Self-pay | Admitting: Orthopaedic Surgery

## 2018-03-10 ENCOUNTER — Ambulatory Visit (INDEPENDENT_AMBULATORY_CARE_PROVIDER_SITE_OTHER): Payer: Medicare Other | Admitting: Orthopaedic Surgery

## 2018-03-10 VITALS — BP 125/84 | HR 78 | Resp 16 | Ht 63.0 in | Wt 210.0 lb

## 2018-03-10 DIAGNOSIS — I639 Cerebral infarction, unspecified: Secondary | ICD-10-CM | POA: Diagnosis not present

## 2018-03-10 DIAGNOSIS — M65342 Trigger finger, left ring finger: Secondary | ICD-10-CM

## 2018-03-10 MED ORDER — DICLOFENAC SODIUM 1 % TD GEL: TRANSDERMAL | 3 refills | Status: DC

## 2018-03-10 NOTE — Progress Notes (Signed)
Office Visit Note   Patient: April Armstrong           Date of Birth: 05/03/1951           MRN: [id number] Visit Date: 03/10/2018              Requested by: Carol Ada, [place], [place] [address] PCP: Carol Ada, MD   Assessment & Plan: Visit Diagnoses:  1. Trigger finger, left ring finger     Plan: Apply ice dorsal splint to PIP joint and try Voltaren gel.  Office 3-4 weeks if no improvement.  Consider surgical release of A1 pulley  Follow-Up Instructions: Return if symptoms worsen or fail to improve.   Orders:  No orders of the defined types were placed in this encounter.  No orders of the defined types were placed in this encounter.     Procedures: No procedures performed   Clinical Data: No additional findings.   Subjective: Chief Complaint  Patient presents with  . Left Hand - Pain  . Follow-up    LEFT RING TRIGGER FINGER  Approximately 6-week history of early triggering of the left ring finger.  No injury or trauma.  Having difficulty at night trying to sleep.  Has prior history of successful release of left long finger triggering  HPI  Review of Systems  Constitutional: Negative for fatigue and fever.  HENT: Negative for ear pain.   Eyes: Negative for pain.  Cardiovascular: Positive for leg swelling.  Gastrointestinal: Positive for diarrhea. Negative for constipation.  Genitourinary: Negative for difficulty urinating.  Musculoskeletal: Positive for neck pain. Negative for back pain.  Skin: Negative for rash.  Neurological: Negative for weakness and numbness.  Hematological: Bruises/bleeds easily.  Psychiatric/Behavioral: Positive for sleep disturbance.     Objective: Vital Signs: BP 125/84 (BP Location: Left Arm, Patient Position: Sitting, Cuff Size: Normal)   Pulse 78   Resp 16   Ht 5\' 3"  (1.6 m)   Wt 210 lb (95.3 kg)   BMI 37.20 kg/m   Physical Exam  Ortho Exam awake alert and oriented x3.   Comfortable sitting.  Painful small nodule over the palmar aspect of the left ring finger.  No active triggering.  Intact.  No swelling of the digit.  Neurovascular exam intact.  Prior incision over the long finger trigger release has healed  Specialty Comments:  No specialty comments available.  Imaging: No results found.   PMFS History: Patient Active Problem List   Diagnosis Date Noted  . Trigger finger, left ring finger 03/10/2018  . BPPV (benign paroxysmal positional vertigo) 10/22/2016  . Anxiety state 06/22/2016  . Tachycardia 04/25/2016  . Bilateral occipital neuralgia   . Cryptogenic stroke ([place]) 04/13/2016  . Cerebral infarction ([place])   . Thyroid activity decreased   . Depression   . Hypothyroidism   . GERD (gastroesophageal reflux disease)   . Hyperlipidemia   . Hypertension   . IBS (irritable bowel syndrome)    Past Medical History:  Diagnosis Date  . Depression   . GERD (gastroesophageal reflux disease)   . Hypercholesterolemia   . Hypertension   . Hypothyroidism   . IBS (irritable bowel syndrome)   . Stroke ([place])   . Thyroid disease     Family History  Problem Relation Age of Onset  . Dementia Mother   . Hypercholesterolemia Mother   . Heart disease Father   . Stroke Father   . Heart disease Brother   .  Congestive Heart Failure Sister     Past Surgical History:  Procedure Laterality Date  . ABDOMINAL HYSTERECTOMY    . APPENDECTOMY    . CESAREAN SECTION    . CHOLECYSTECTOMY    . EP IMPLANTABLE DEVICE N/A 12/28/2016   Procedure: Loop Recorder Insertion;  Surgeon: Evans Lance, MD;  Location: [facility];  Service: Cardiovascular;  Laterality: N/A;  . THYROIDECTOMY    . TRIGGER FINGER RELEASE     Social History   Occupational History  . Not on file  Tobacco Use  . Smoking status: Never Smoker  . Smokeless tobacco: Never Used  Substance and Sexual Activity  . Alcohol use: No  . Drug use: No  . Sexual activity: Not on file

## 2018-03-17 DIAGNOSIS — Z780 Asymptomatic menopausal state: Secondary | ICD-10-CM | POA: Diagnosis not present

## 2018-03-17 DIAGNOSIS — M8588 Other specified disorders of bone density and structure, other site: Secondary | ICD-10-CM | POA: Diagnosis not present

## 2018-03-19 ENCOUNTER — Telehealth (INDEPENDENT_AMBULATORY_CARE_PROVIDER_SITE_OTHER): Payer: Self-pay | Admitting: Orthopaedic Surgery

## 2018-03-19 NOTE — Telephone Encounter (Signed)
Patient wants to go ahead and schedule surgery on ring finger lt hand. Patient states brace, and cream are not helping. Finger driving her crazy. Please call to advise.

## 2018-03-20 NOTE — Telephone Encounter (Signed)
Completed OR form

## 2018-03-20 NOTE — Telephone Encounter (Signed)
Please advise 

## 2018-03-27 ENCOUNTER — Ambulatory Visit
Admission: RE | Admit: 2018-03-27 | Discharge: 2018-03-27 | Disposition: A | Discharge status: Home / Self Care | Payer: Medicare Other | Source: Ambulatory Visit | Attending: Family Medicine | Admitting: Family Medicine

## 2018-03-27 DIAGNOSIS — Z1231 Encounter for screening mammogram for malignant neoplasm of breast: Secondary | ICD-10-CM

## 2018-04-01 ENCOUNTER — Ambulatory Visit (INDEPENDENT_AMBULATORY_CARE_PROVIDER_SITE_OTHER): Payer: Medicare Other | Admitting: *Deleted

## 2018-04-01 ENCOUNTER — Telehealth (INDEPENDENT_AMBULATORY_CARE_PROVIDER_SITE_OTHER): Payer: Self-pay | Admitting: Orthopaedic Surgery

## 2018-04-01 DIAGNOSIS — I639 Cerebral infarction, unspecified: Secondary | ICD-10-CM | POA: Diagnosis not present

## 2018-04-01 NOTE — Telephone Encounter (Signed)
Please see below.

## 2018-04-01 NOTE — Telephone Encounter (Signed)
I called patient regarding aspirin instructions and emailed Terri @ [facility].  The patient also mentioned that she's had this type surgery before on her middle finger and recalls not feeling completely numb and experiencing pain before the procedure had ended. I did tell her that I would share this information with Dr. Durward Fortes.

## 2018-04-01 NOTE — Telephone Encounter (Signed)
Stop ASA. Will be using local anesthetic

## 2018-04-01 NOTE — Telephone Encounter (Signed)
Please see Dr. Rudene Anda comment below. Thank you.

## 2018-04-01 NOTE — Telephone Encounter (Signed)
Patient is scheduled for surgery this Thursday.  I received this from the surgical center today.    THIS PATIENT HAS A CARDIAC HISTORY WITH LOOP RECORDER AND ALSO HAD A STROKE,PLEASE INSTRUCT PATIENT CONCERING ASPIRN AND SEND WRITTEN IMFROMATIOM TO ME. [place] [facility] [place]  [place], [place] [address] Phone [phone] ext [phone] Fax [phone]

## 2018-04-01 NOTE — Telephone Encounter (Signed)
Please advise 

## 2018-04-02 LAB — CUP PACEART REMOTE DEVICE CHECK
Implantable Pulse Generator Implant Date: 20180202
MDC IDC SESS DTM: 20190405020535

## 2018-04-02 NOTE — Telephone Encounter (Signed)
thanks

## 2018-04-02 NOTE — Progress Notes (Signed)
[facility] Summary Report / Loop Recorder 

## 2018-04-03 ENCOUNTER — Encounter: Payer: Self-pay | Admitting: Orthopaedic Surgery

## 2018-04-03 DIAGNOSIS — M65342 Trigger finger, left ring finger: Secondary | ICD-10-CM | POA: Diagnosis not present

## 2018-04-07 ENCOUNTER — Encounter (INDEPENDENT_AMBULATORY_CARE_PROVIDER_SITE_OTHER): Payer: Self-pay | Admitting: Orthopaedic Surgery

## 2018-04-07 ENCOUNTER — Ambulatory Visit (INDEPENDENT_AMBULATORY_CARE_PROVIDER_SITE_OTHER): Payer: Medicare Other | Admitting: Orthopaedic Surgery

## 2018-04-07 VITALS — BP 121/73 | HR 74 | Resp 16 | Ht 62.5 in | Wt 224.9 lb

## 2018-04-07 DIAGNOSIS — M65342 Trigger finger, left ring finger: Secondary | ICD-10-CM

## 2018-04-07 NOTE — Progress Notes (Signed)
Office Visit Note   Patient: April Armstrong           Date of Birth: Oct 25, 1951           MRN: [id number] Visit Date: 04/07/2018              Requested by: Carol Ada, [place], [place] [address] PCP: Carol Ada, MD   Assessment & Plan: Visit Diagnoses:  1. Trigger finger, left ring finger     Plan: 4 days status post release of left ring trigger finger under local anesthetic.  Doing very well.  No complaints.  Wound was clean small Steri-Strips applied.  May now bathe with a waterproof Band-Aid.  Office 1 week to remove stitches  Follow-Up Instructions: Return in about 1 week (around 04/14/2018).   Orders:  No orders of the defined types were placed in this encounter.  No orders of the defined types were placed in this encounter.     Procedures: No procedures performed   Clinical Data: No additional findings.   Subjective: Chief Complaint  Patient presents with  . Left Hand - Pain  . Follow-up    04/03/18 LEFT RING FINGER TRIGGER RELEASE, JUST A LITTLE SORE    HPI  Review of Systems  Constitutional: Negative for fatigue and fever.  HENT: Negative for ear pain.   Eyes: Negative for pain.  Respiratory: Negative for cough and shortness of breath.   Cardiovascular: Negative for leg swelling.  Gastrointestinal: Positive for diarrhea. Negative for constipation.  Genitourinary: Negative for difficulty urinating.  Musculoskeletal: Negative for back pain and neck pain.  Skin: Negative for rash.  Allergic/Immunologic: Negative for food allergies.  Neurological: Positive for weakness. Negative for numbness.  Hematological: Bruises/bleeds easily.  Psychiatric/Behavioral: Negative for sleep disturbance.     Objective: Vital Signs: BP 121/73 (BP Location: Left Arm, Patient Position: Sitting, Cuff Size: Normal)   Pulse 74   Resp 16   Ht 5' 2.5" (1.588 m)   Wt 224 lb 13.9 oz (102 kg)   BMI 40.47 kg/m   Physical Exam  Ortho  Exam awake alert and oriented x3.  Comfortable sitting.  No shortness of breath or chest pain.  Dressing removed from left hand.  Ring trigger finger incision healing without problem.  This was cleaned and Steri-Strips applied over benzoin followed by waterproof bandage.  Neurovascular exam intact.  Able to move the finger without problem and no triggering.  Specialty Comments:  No specialty comments available.  Imaging: No results found.   PMFS History: Patient Active Problem List   Diagnosis Date Noted  . Trigger finger, left ring finger 03/10/2018  . BPPV (benign paroxysmal positional vertigo) 10/22/2016  . Anxiety state 06/22/2016  . Tachycardia 04/25/2016  . Bilateral occipital neuralgia   . Cryptogenic stroke ([place]) 04/13/2016  . Cerebral infarction ([place])   . Thyroid activity decreased   . Depression   . Hypothyroidism   . GERD (gastroesophageal reflux disease)   . Hyperlipidemia   . Hypertension   . IBS (irritable bowel syndrome)    Past Medical History:  Diagnosis Date  . Depression   . GERD (gastroesophageal reflux disease)   . Hypercholesterolemia   . Hypertension   . Hypothyroidism   . IBS (irritable bowel syndrome)   . Stroke ([place])   . Thyroid disease     Family History  Problem Relation Age of Onset  . Dementia Mother   . Hypercholesterolemia Mother   . Heart disease  Father   . Stroke Father   . Heart disease Brother   . Congestive Heart Failure Sister   . Breast cancer Paternal Aunt     Past Surgical History:  Procedure Laterality Date  . ABDOMINAL HYSTERECTOMY    . APPENDECTOMY    . CESAREAN SECTION    . CHOLECYSTECTOMY    . EP IMPLANTABLE DEVICE N/A 12/28/2016   Procedure: Loop Recorder Insertion;  Surgeon: Evans Lance, MD;  Location: [facility];  Service: Cardiovascular;  Laterality: N/A;  . THYROIDECTOMY    . TRIGGER FINGER RELEASE     Social History   Occupational History  . Not on file  Tobacco Use  . Smoking status: Never  Smoker  . Smokeless tobacco: Current User    Types: Chew  Substance and Sexual Activity  . Alcohol use: No  . Drug use: No  . Sexual activity: Not on file

## 2018-04-14 ENCOUNTER — Ambulatory Visit (INDEPENDENT_AMBULATORY_CARE_PROVIDER_SITE_OTHER): Payer: Medicare Other | Admitting: Orthopaedic Surgery

## 2018-04-14 ENCOUNTER — Encounter (INDEPENDENT_AMBULATORY_CARE_PROVIDER_SITE_OTHER): Payer: Self-pay | Admitting: Orthopaedic Surgery

## 2018-04-14 VITALS — BP 135/72 | HR 72 | Ht 62.5 in | Wt 244.0 lb

## 2018-04-14 DIAGNOSIS — M65342 Trigger finger, left ring finger: Secondary | ICD-10-CM

## 2018-04-14 NOTE — Progress Notes (Signed)
Office Visit Note   Patient: April Armstrong           Date of Birth: 1951/05/15           MRN: [id number] Visit Date: 04/14/2018              Requested by: Carol Ada, [place], [place] [address] PCP: Carol Ada, MD   Assessment & Plan: Visit Diagnoses:  1. Trigger finger, left ring finger     Plan: 11 days status post release of left ring trigger finger.  No complications.  Doing very well without any pain.  Return to office as needed  Follow-Up Instructions: Return if symptoms worsen or fail to improve.   Orders:  No orders of the defined types were placed in this encounter.  No orders of the defined types were placed in this encounter.     Procedures: No procedures performed   Clinical Data: No additional findings.   Subjective: Chief Complaint  Patient presents with  . Left Hand - Pain  . Follow-up    04/01/18 LEFT TRIGGER RING FINGER, HAVING SOME PAIN   Minimal swelling of left ring finger initially but now wearing her rings.  Really no significant pain.  No recurrent triggering.  HPI  Review of Systems  Constitutional: Positive for fatigue. Negative for fever.  HENT: Negative for ear pain.   Eyes: Negative for pain.  Respiratory: Negative for cough and shortness of breath.   Cardiovascular: Positive for leg swelling.  Gastrointestinal: Positive for diarrhea. Negative for constipation.  Genitourinary: Negative for difficulty urinating.  Musculoskeletal: Negative for back pain and neck pain.  Skin: Negative for rash.  Allergic/Immunologic: Negative for food allergies.  Neurological: Positive for weakness. Negative for numbness.  Psychiatric/Behavioral: Negative for sleep disturbance.     Objective: Vital Signs: BP 135/72 (BP Location: Left Arm, Patient Position: Sitting, Cuff Size: Normal)   Pulse 72   Ht 5' 2.5" (1.588 m)   Wt 244 lb (110.7 kg)   BMI 43.92 kg/m   Physical Exam  Ortho Exam awake alert and  oriented x3.  Comfortable sitting.  No triggering with full motion of left ring finger.  Incision is healed without any problems.  The 2 remaining Ethilon stitches were removed.  Neurovascular exam intact.  Wearing her rings on her ring finger without any problem.  No significant swelling.  Neurovascular exam intact  Specialty Comments:  No specialty comments available.  Imaging: No results found.   PMFS History: Patient Active Problem List   Diagnosis Date Noted  . Trigger finger, left ring finger 03/10/2018  . BPPV (benign paroxysmal positional vertigo) 10/22/2016  . Anxiety state 06/22/2016  . Tachycardia 04/25/2016  . Bilateral occipital neuralgia   . Cryptogenic stroke ([facility]) 04/13/2016  . Cerebral infarction ([facility])   . Thyroid activity decreased   . Depression   . Hypothyroidism   . GERD (gastroesophageal reflux disease)   . Hyperlipidemia   . Hypertension   . IBS (irritable bowel syndrome)    Past Medical History:  Diagnosis Date  . Depression   . GERD (gastroesophageal reflux disease)   . Hypercholesterolemia   . Hypertension   . Hypothyroidism   . IBS (irritable bowel syndrome)   . Stroke ([facility])   . Thyroid disease     Family History  Problem Relation Age of Onset  . Dementia Mother   . Hypercholesterolemia Mother   . Heart disease Father   . Stroke Father   .  Heart disease Brother   . Congestive Heart Failure Sister   . Breast cancer Paternal Aunt     Past Surgical History:  Procedure Laterality Date  . ABDOMINAL HYSTERECTOMY    . APPENDECTOMY    . CESAREAN SECTION    . CHOLECYSTECTOMY    . EP IMPLANTABLE DEVICE N/A 12/28/2016   Procedure: Loop Recorder Insertion;  Surgeon: Evans Lance, MD;  Location: [facility];  Service: Cardiovascular;  Laterality: N/A;  . THYROIDECTOMY    . TRIGGER FINGER RELEASE     Social History   Occupational History  . Not on file  Tobacco Use  . Smoking status: Never Smoker  . Smokeless tobacco: Current User      Types: Chew  Substance and Sexual Activity  . Alcohol use: No  . Drug use: No  . Sexual activity: Not on file

## 2018-04-22 LAB — CUP PACEART REMOTE DEVICE CHECK
Implantable Pulse Generator Implant Date: 20180202
MDC IDC SESS DTM: 20190508023602

## 2018-05-01 DIAGNOSIS — I8311 Varicose veins of right lower extremity with inflammation: Secondary | ICD-10-CM | POA: Diagnosis not present

## 2018-05-01 DIAGNOSIS — I83893 Varicose veins of bilateral lower extremities with other complications: Secondary | ICD-10-CM | POA: Diagnosis not present

## 2018-05-01 DIAGNOSIS — I8312 Varicose veins of left lower extremity with inflammation: Secondary | ICD-10-CM | POA: Diagnosis not present

## 2018-05-05 ENCOUNTER — Ambulatory Visit (INDEPENDENT_AMBULATORY_CARE_PROVIDER_SITE_OTHER): Payer: Medicare Other | Admitting: *Deleted

## 2018-05-05 DIAGNOSIS — I639 Cerebral infarction, unspecified: Secondary | ICD-10-CM | POA: Diagnosis not present

## 2018-05-05 NOTE — Progress Notes (Signed)
[facility] Summary Report / Loop Recorder 

## 2018-05-16 ENCOUNTER — Telehealth (INDEPENDENT_AMBULATORY_CARE_PROVIDER_SITE_OTHER): Payer: Self-pay | Admitting: *Deleted

## 2018-05-16 NOTE — Telephone Encounter (Signed)
Pt is scheduled for 7/22 with driver.

## 2018-05-16 NOTE — Telephone Encounter (Signed)
Left L4-5 and L5-S1 facet ok but will eval at time

## 2018-06-06 ENCOUNTER — Ambulatory Visit (INDEPENDENT_AMBULATORY_CARE_PROVIDER_SITE_OTHER): Payer: Medicare Other | Admitting: Cardiology

## 2018-06-06 ENCOUNTER — Encounter: Payer: Self-pay | Admitting: Cardiology

## 2018-06-06 VITALS — BP 120/82 | HR 71 | Ht 62.5 in | Wt 224.8 lb

## 2018-06-06 DIAGNOSIS — I639 Cerebral infarction, unspecified: Secondary | ICD-10-CM

## 2018-06-06 DIAGNOSIS — T82848A Pain from vascular prosthetic devices, implants and grafts, initial encounter: Secondary | ICD-10-CM | POA: Diagnosis not present

## 2018-06-06 DIAGNOSIS — I10 Essential (primary) hypertension: Secondary | ICD-10-CM | POA: Diagnosis not present

## 2018-06-06 DIAGNOSIS — E782 Mixed hyperlipidemia: Secondary | ICD-10-CM | POA: Diagnosis not present

## 2018-06-06 NOTE — Progress Notes (Signed)
Cardiology Office Note   Date:  06/08/2018   ID:  April Armstrong, DOB 01/11/1951, MRN [id number]  PCP:  Carol Ada, MD  Cardiologist:  Dr. Tamala Julian     Chief Complaint  Patient presents with  . Hypertension      History of Present Illness: April Armstrong is a 67 y.o. female who presents for HTN.  She has a hx of cryptogenic stroke, ILR, essential hypertension, and hyperlipidemia.  Doing well. Loop recorder has been evaluated monthly since implantation without noting any evidence of atrial fibrillation or significant atrial arrhythmia and this has continued. I reviewed all results.   Today she complains pain at site of LINQ  Very tender at site the Linq is located.  Occurs when sitting up.  Not sure if she is palpating area more and this is causing the discomfort.  She would like to have removed.  Discussed it is only been in 1 year, and is good for 3 years.  Otherwise no chest pain or SOB. No lightheadedness.  No fever.     Past Medical History:  Diagnosis Date  . Depression   . GERD (gastroesophageal reflux disease)   . Hypercholesterolemia   . Hypertension   . Hypothyroidism   . IBS (irritable bowel syndrome)   . Stroke ([facility])   . Thyroid disease     Past Surgical History:  Procedure Laterality Date  . ABDOMINAL HYSTERECTOMY    . APPENDECTOMY    . CESAREAN SECTION    . CHOLECYSTECTOMY    . EP IMPLANTABLE DEVICE N/A 12/28/2016   Procedure: Loop Recorder Insertion;  Surgeon: Evans Lance, MD;  Location: [facility];  Service: Cardiovascular;  Laterality: N/A;  . THYROIDECTOMY    . TRIGGER FINGER RELEASE       Current Outpatient Medications  Medication Sig Dispense Refill  . aspirin 325 MG tablet Take 1 tablet (325 mg total) by mouth daily.    Marland Kitchen atorvastatin (LIPITOR) 80 MG tablet Take 1 tablet (80 mg total) by mouth at bedtime. 30 tablet 0  . buPROPion (WELLBUTRIN XL) 300 MG 24 hr tablet Take 300 mg by mouth daily.     . diclofenac sodium (VOLTAREN)  1 % GEL Apply 2-3 times a day to large joint area 2 Tube 3  . lamoTRIgine (LAMICTAL) 100 MG tablet Take 100 mg by mouth daily.     Marland Kitchen levothyroxine (SYNTHROID, LEVOTHROID) 112 MCG tablet Take 112 mcg by mouth daily before breakfast.    . LORazepam (ATIVAN) 0.5 MG tablet Take 0.5 mg by mouth every 6 (six) hours as needed for anxiety.     . metoprolol succinate (TOPROL-XL) 25 MG 24 hr tablet Take 25 mg by mouth daily.     Marland Kitchen omeprazole (PRILOSEC) 40 MG capsule Take 40 mg by mouth daily.     . RESTASIS 0.05 % ophthalmic emulsion Place 1 drop into both eyes 2 (two) times daily.     . sertraline (ZOLOFT) 100 MG tablet Take 100 mg by mouth daily.     Marland Kitchen spironolactone (ALDACTONE) 25 MG tablet Take 25 mg by mouth daily.     . traZODone (DESYREL) 150 MG tablet Take 75 mg by mouth at bedtime.     . vitamin B-12 (CYANOCOBALAMIN) 100 MCG tablet Take 100 mcg by mouth at bedtime.     No current facility-administered medications for this visit.     Allergies:   Patient has no known allergies.    Social History:  The patient  reports that she has never smoked. Her smokeless tobacco use includes chew. She reports that she does not drink alcohol or use drugs.   Family History:  The patient's family history includes Breast cancer in her paternal aunt; Congestive Heart Failure in her sister; Dementia in her mother; Heart disease in her brother and father; Hypercholesterolemia in her mother; Stroke in her father.    ROS:  General:no colds or fevers, no weight changes Skin:no rashes or ulcers HEENT:no blurred vision, no congestion CV:see HPI PUL:see HPI GI:no diarrhea constipation or melena, no indigestion GU:no hematuria, no dysuria MS:no joint pain, no claudication Neuro:no syncope, no lightheadedness Endo:no diabetes, + thyroid disease  Wt Readings from Last 3 Encounters:  06/06/18 224 lb 12.8 oz (102 kg)  04/14/18 244 lb (110.7 kg)  04/07/18 224 lb 13.9 oz (102 kg)     PHYSICAL EXAM: VS:  BP  120/82   Pulse 71   Ht 5' 2.5" (1.588 m)   Wt 224 lb 12.8 oz (102 kg)   SpO2 94%   BMI 40.46 kg/m  , BMI Body mass index is 40.46 kg/m. General:Pleasant affect, NAD Skin:Warm and dry, brisk capillary refill HEENT:normocephalic, sclera clear, mucus membranes moist Neck:supple, no JVD, no bruits  Heart:S1S2 RRR without murmur, gallup, rub or click--chest wall with tenderness sitting up,  Point tenderness.  Lungs:clear without rales, rhonchi, or wheezes ZRA:QTMA, non tender, + BS, do not palpate liver spleen or masses Ext:no lower ext edema, 2+ pedal pulses, 2+ radial pulses Neuro:alert and oriented X 3, MAE, follows commands, + facial symmetry    EKG:  EKG is ordered today. The ekg ordered today demonstrates SR non specific T wave abnormality no acute changes    Recent Labs: No results found for requested labs within last 8760 hours.  LDL 96  DONE 07/18/17 HDL 48  To have labs done with PCP   Lipid Panel    Component Value Date/Time   CHOL 167 04/13/2016 0512   TRIG 172 (H) 04/13/2016 0512   HDL 41 04/13/2016 0512   CHOLHDL 4.1 04/13/2016 0512   VLDL 34 04/13/2016 0512   LDLCALC 92 04/13/2016 0512       Other studies Reviewed: Additional studies/ records that were reviewed today include: . nuc study 12/06/16 Study Highlights     Nuclear stress EF: 60%.  There was no ST segment deviation noted during stress.  The study is normal.   Low risk stress nuclear study with normal perfusion and normal left ventricular regional and global systolic function.    TTE 04/13/16   Study Conclusions  - Left ventricle: The cavity size was normal. Systolic function was   normal. The estimated ejection fraction was in the range of 55%   to 60%. Wall motion was normal; there were no regional wall   motion abnormalities. Doppler parameters are consistent with   abnormal left ventricular relaxation (grade 1 diastolic   dysfunction). - Mitral valve: Calcified  annulus.  Impressions:  - Normal LV systolic function; grade 1 diastolic dysfunction.  ASSESSMENT AND PLAN:  1.  Chest wall pain, concern for infection at linq site.  Discussed with EP PA and will check ultrasound of tissue at site looking for fluid accumulation.  Or any sign of infection, she will try tylenol for pain.  May need to refer to Dr. Lovena Le for possible removal.    2.  HTN controlled continue meds  3.  Hx cryptogenic stroke and no a fib on Linq that was placed  4.  HLD per pcp   Current medicines are reviewed with the patient today.  The patient Has no concerns regarding medicines.  The following changes have been made:  See above Labs/ tests ordered today include:see above  Disposition:   FU:  see above  Signed, Cecilie Kicks, NP  06/08/2018 7:07 PM    [facility] [place], [place], [address] [place], [place] Phone: [phone]; Fax: [phone]

## 2018-06-06 NOTE — Patient Instructions (Signed)
Medication Instructions:  Your provider recommends that you continue on your current medications as directed. Please refer to the Current Medication list given to you today.    Labwork: None  Testing/Procedures: Mickel Baas recommends you have a CHEST WALL ULTTASOUND.  Follow-Up: Your provider wants you to follow-up in: 1 year with Dr. Tamala Julian. You will receive a reminder letter in the mail two months in advance. If you don't receive a letter, please call our office to schedule the follow-up appointment.    Any Other Special Instructions Will Be Listed Below (If Applicable).     If you need a refill on your cardiac medications before your next appointment, please call your pharmacy.

## 2018-06-08 ENCOUNTER — Encounter: Payer: Self-pay | Admitting: Cardiology

## 2018-06-09 ENCOUNTER — Ambulatory Visit (INDEPENDENT_AMBULATORY_CARE_PROVIDER_SITE_OTHER): Payer: Medicare Other | Admitting: *Deleted

## 2018-06-09 DIAGNOSIS — I639 Cerebral infarction, unspecified: Secondary | ICD-10-CM | POA: Diagnosis not present

## 2018-06-09 NOTE — Progress Notes (Signed)
[facility] Summary Report / Loop Recorder 

## 2018-06-10 ENCOUNTER — Ambulatory Visit (HOSPITAL_COMMUNITY)
Admission: RE | Admit: 2018-06-10 | Discharge: 2018-06-10 | Disposition: A | Discharge status: Home / Self Care | Payer: Medicare Other | Source: Ambulatory Visit | Attending: Cardiology | Admitting: Cardiology

## 2018-06-10 DIAGNOSIS — T82848A Pain from vascular prosthetic devices, implants and grafts, initial encounter: Secondary | ICD-10-CM | POA: Insufficient documentation

## 2018-06-10 DIAGNOSIS — Z8739 Personal history of other diseases of the musculoskeletal system and connective tissue: Secondary | ICD-10-CM | POA: Diagnosis not present

## 2018-06-10 LAB — CUP PACEART REMOTE DEVICE CHECK
Date Time Interrogation Session: 20190610033914
Implantable Pulse Generator Implant Date: 20180202

## 2018-06-13 ENCOUNTER — Telehealth: Payer: Self-pay | Admitting: Cardiology

## 2018-06-13 NOTE — Telephone Encounter (Signed)
New Message   Pt is returning call for nurse about mri results

## 2018-06-13 NOTE — Telephone Encounter (Signed)
Left message for patient to call back  

## 2018-06-13 NOTE — Telephone Encounter (Signed)
Patient called back. Informed her of message below from her [place]. Will forward to Dr. Lovena Le and his nurse for further advisement.   Notes recorded by Isaiah Serge, NP on 06/11/2018 at 5:07 PM EDT Please let pt know no infection noted - but let's have her see Dr. Lovena Le - he placed the loop recorder and it is causing pain. He may agree to remove it. Thanks.

## 2018-06-15 NOTE — Telephone Encounter (Signed)
Usually the pain improves with time. GT

## 2018-06-16 ENCOUNTER — Ambulatory Visit (INDEPENDENT_AMBULATORY_CARE_PROVIDER_SITE_OTHER): Payer: Self-pay

## 2018-06-16 ENCOUNTER — Encounter

## 2018-06-16 ENCOUNTER — Ambulatory Visit (INDEPENDENT_AMBULATORY_CARE_PROVIDER_SITE_OTHER): Payer: Medicare Other | Admitting: Physical Medicine and Rehabilitation

## 2018-06-16 ENCOUNTER — Encounter (INDEPENDENT_AMBULATORY_CARE_PROVIDER_SITE_OTHER): Payer: Self-pay | Admitting: Physical Medicine and Rehabilitation

## 2018-06-16 VITALS — BP 121/70 | HR 67 | Temp 97.7°F

## 2018-06-16 DIAGNOSIS — M7061 Trochanteric bursitis, right hip: Secondary | ICD-10-CM

## 2018-06-16 NOTE — Patient Instructions (Signed)

## 2018-06-16 NOTE — Telephone Encounter (Signed)
Left the pt a message to call the office back and request to speak with a triage nurse, to endorse recommendations per Dr. Lovena Le.

## 2018-06-16 NOTE — Progress Notes (Signed)
.  Numeric Pain Rating Scale and Functional Assessment Average Pain 3   In the last MONTH (on 0-10 scale) has pain interfered with the following?  1. General activity like being  able to carry out your everyday physical activities such as walking, climbing stairs, carrying groceries, or moving a chair?  Rating(4)   +Driver, -BT, -Dye Allergies.  

## 2018-06-16 NOTE — Progress Notes (Signed)
April Armstrong - 67 y.o. female MRN [id number]  Date of birth: 1951-03-25  Office Visit Note: Visit Date: 06/16/2018 PCP: Carol Ada, MD Referred by: Carol Ada, MD  Subjective: Chief Complaint  Patient presents with  . Lower Back - Pain  . Right Hip - Pain  . Left Lower Leg - Pain   HPI: April Armstrong is a 67 year old female that I have seen over the last few years but have not seen her since May 2018.  In the past she has had mostly axial low back pain which was relieved with radiofrequency ablation of the L4-5 facet joints bilaterally.  She had small listhesis and facet arthropathy at this level.  She went on to have more lower back lumbosacral junction and buttock pain for some time that was alleviated to a mild degree with a sacroiliac joint injection diagnostically.  She then went on to have physical therapy at [facility] physical therapy on [address] with some good results.  Since I have seen her she has had a cardiac loop monitor inserted and she is been having issues with possible cryptogenic stroke and TIA type activity.  She has had no other changes in her health otherwise.  She comes in today with a history of several weeks of left-sided acute onset of lumbar strain sprain type pain from working in the yard and twisting.  Fortunately over the last few weeks this is actually resolved and now she is having right greater trochanteric area pain that radiates in the upper thigh.  Is worse when she lays on that side and tries to sit.  This also came from an unsteady movement in the yard while she was working in the yard.  They have moved into a new house with a large yard and they have been doing some work.  She had no radicular complaints to the feet or no paresthesias.  Pain is been ongoing for a few weeks.  She has not been using medications other than some Tylenol and ibuprofen.  She does not report any left-sided pain today.  She reports her back pain is still doing well is been  the issue around the lumbosacral junction buttock area and greater trochanters.  She denies any focal weakness or bowel or bladder issues or specific trauma.  She also endorses today pain over the left Achilles tendon.  She has had prior surgery on the right Achilles tendon with prior rupture and rerupture.  She is concerned for her Achilles tendon because she does not want to go through what she went through on the right.  She does not endorse any specific trauma or injury to the foot and ankle on the left.  She does endorse since I have seen her that she has had bilateral vascular procedures done in both legs because of some swelling and pain and that is seemingly helped a lot of her issues in the legs and even in her back.   Review of Systems  Constitutional: Negative for chills, fever, malaise/fatigue and weight loss.  HENT: Negative for hearing loss and sinus pain.   Eyes: Negative for blurred vision, double vision and photophobia.  Respiratory: Negative for cough and shortness of breath.   Cardiovascular: Negative for chest pain, palpitations and leg swelling.  Gastrointestinal: Negative for abdominal pain, nausea and vomiting.  Genitourinary: Negative for flank pain.  Musculoskeletal: Positive for back pain and joint pain. Negative for myalgias.  Skin: Negative for itching and rash.  Neurological: Negative for tremors, focal  weakness and weakness.  Endo/Heme/Allergies: Negative.   Psychiatric/Behavioral: Negative for depression.  All other systems reviewed and are negative.  Otherwise per HPI.  Assessment & Plan: Visit Diagnoses:  1. Greater trochanteric bursitis, right     Plan: Findings:  1.  Chronic history of low back pain and lumbosacral pain and bilateral hip and thigh pain.  Part of this pain is been from early facet arthropathy at L4-5 with small listhesis.  She did well with prior radiofrequency ablation.  Her pain today is really more on the right over the greater trochanter  with pretty exquisite pain on palpation.  We are going to complete a diagnostic and hopefully therapeutic greater trochanteric injection with fluoroscopic guidance.  Fluoroscopic guidance to the patient's body habitus.  Patient does have a BMI of 40.  She has had this issue with lumbar sprain strain type activity at times with different movements and we discussed the pathophysiology of this and how to take care of these and they can be quite painful at times with a certain movement and spasm.  She has been using heat and ice which is appropriate.  She has had vein procedures done on the lower extremities and this is seemingly helped her overall I think that is great.  There is probably some level of myofascial pain and possibly fibromyalgia although she really does pretty good from an activity standpoint otherwise.  No medication changes today depending on how she does with the injection for her greater trochanter.  Left-sided pain she was having has resolved pretty well.  In the future if her back pain would return and be more fitting with facet mediated pain would look at repeat radiofrequency ablation.  2.  In terms of her left Achilles tendon pain she has very tight heel cord and lacks some range of motion.  We have talked about gentle stretching and stretching office set of stairs.  I also would like her to follow-up with Dr. Durward Fortes concerning her foot and ankle as she is to see him for orthopedic care.    Meds & Orders: No orders of the defined types were placed in this encounter.   Orders Placed This Encounter  Procedures  . Large Joint Inj: R greater trochanter  . XR C-ARM NO REPORT    Follow-up: Return if symptoms worsen or fail to improve.   Procedures: Large Joint Inj: R greater trochanter on 06/16/2018 11:09 AM Indications: pain and diagnostic evaluation Details: 22 G 3.5 in needle, fluoroscopy-guided lateral approach  Arthrogram: No  Medications: 4 mL bupivacaine 0.25 %; 4 mL  lidocaine 2 %; 80 mg triamcinolone acetonide 40 MG/ML Outcome: tolerated well, no immediate complications  There was excellent flow of contrast outlined the greater trochanteric bursa without vascular uptake. Procedure, treatment alternatives, risks and benefits explained, specific risks discussed. Consent was given by the patient. Immediately prior to procedure a time out was called to verify the correct patient, procedure, equipment, support staff and site/side marked as required. Patient was prepped and draped in the usual sterile fashion.      No notes on file   Clinical History: MRI LUMBAR SPINE WITHOUT CONTRAST  TECHNIQUE: Multiplanar, multisequence MR imaging of the lumbar spine was performed. No intravenous contrast was administered.  COMPARISON:  None.  FINDINGS: Vertebral body height is maintained. Trace anterolisthesis L4 on L5 due to facet arthropathy is noted. The conus medullaris is normal in signal and position. Imaged intra-abdominal contents are unremarkable.  T11-12 and T12-L1 are imaged in  the sagittal plane only. T12-L1 is unremarkable. There is a shallow disc bulge at T11-12 but the central canal and foramina are open.  L1-2:  Negative.  L2-3:  Mild facet degenerative change.  Otherwise negative.  L3-4:  Mild facet degenerative change.  Otherwise negative.  L4-5: Moderately severe facet degenerative change is present. The disc is uncovered without bulging. The central canal is mildly narrowed. Mild to moderate foraminal narrowing is more notable on the left.  L5-S1: There is some facet degenerative change. No disc bulge or protrusion. The central canal and foramina are widely patent.  IMPRESSION: Mild spondylosis most notable at L4-5 where facet degenerative change results in trace anterolisthesis and there is mild to moderate foraminal narrowing, more notable on the left. The central canal is only mildly narrowed at this level. No nerve  root compression is identified.   Electronically Signed   By: Inge Rise M.D.   On: 02/07/2016 13:03   She reports that she has never smoked. Her smokeless tobacco use includes chew. No results for input(s): HGBA1C, LABURIC in the last 8760 hours.  Objective:  VS:  HT:    WT:   BMI:     BP:121/70  HR:67bpm  TEMP:97.7 F (36.5 C)( )  RESP:94 % Physical Exam  Constitutional: She is oriented to person, place, and time. She appears well-developed and well-nourished. No distress.  Obese  HENT:  Head: Normocephalic and atraumatic.  Nose: Nose normal.  Mouth/Throat: Oropharynx is clear and moist.  Eyes: Pupils are equal, round, and reactive to light. Conjunctivae are normal.  Neck: Normal range of motion. Neck supple.  Cardiovascular: Regular rhythm and intact distal pulses.  Pulmonary/Chest: Effort normal. No respiratory distress.  Abdominal: Soft. She exhibits no distension. There is no rebound and no guarding.  Musculoskeletal:  Patient ambulates without aid with a somewhat antalgic gait to the right.  She has concordant and pretty exquisite pain over the right greater trochanter.  She does not have this on the left.  She has no pain with hip rotation internally bilaterally.  She does have pain upon standing with extension.  She has no pain over the PSIS.  She has good distal strength without any deficits bilaterally.  There is a well-healed surgical scar over the right Achilles tendon.  Left Achilles does not show any deformation or swelling.  She does have tight heel cord.  She does lack some range of motion of the left ankle.  There is no swelling.  Neurological: She is alert and oriented to person, place, and time. She exhibits normal muscle tone. Coordination normal.  Skin: Skin is warm. No rash noted. No erythema.  Psychiatric: She has a normal mood and affect. Her behavior is normal.  Nursing note and vitals reviewed.   Ortho Exam Imaging: Xr C-arm No  Report  Result Date: 06/16/2018 Please see Notes tab for imaging impression.   Past Medical/Family/Surgical/Social History: Medications & Allergies reviewed per EMR, new medications updated. Patient Active Problem List   Diagnosis Date Noted  . Trigger finger, left ring finger 03/10/2018  . BPPV (benign paroxysmal positional vertigo) 10/22/2016  . Anxiety state 06/22/2016  . Tachycardia 04/25/2016  . Bilateral occipital neuralgia   . Cryptogenic stroke ([place]) 04/13/2016  . Cerebrovascular accident (CVA) due to thrombosis of right middle cerebral artery ([place]) 04/13/2016  . Cerebral infarction ([place])   . Thyroid activity decreased   . Depression   . Hypothyroidism   . GERD (gastroesophageal reflux disease)   . Hyperlipidemia   .  Hypertension   . IBS (irritable bowel syndrome)    Past Medical History:  Diagnosis Date  . Depression   . GERD (gastroesophageal reflux disease)   . Hypercholesterolemia   . Hypertension   . Hypothyroidism   . IBS (irritable bowel syndrome)   . Stroke ([place])   . Thyroid disease    Family History  Problem Relation Age of Onset  . Dementia Mother   . Hypercholesterolemia Mother   . Heart disease Father   . Stroke Father   . Heart disease Brother   . Congestive Heart Failure Sister   . Breast cancer Paternal Aunt    Past Surgical History:  Procedure Laterality Date  . ABDOMINAL HYSTERECTOMY    . APPENDECTOMY    . CESAREAN SECTION    . CHOLECYSTECTOMY    . EP IMPLANTABLE DEVICE N/A 12/28/2016   Procedure: Loop Recorder Insertion;  Surgeon: Evans Lance, MD;  Location: [facility];  Service: Cardiovascular;  Laterality: N/A;  . THYROIDECTOMY    . TRIGGER FINGER RELEASE     Social History   Occupational History  . Not on file  Tobacco Use  . Smoking status: Never Smoker  . Smokeless tobacco: Current User    Types: Chew  Substance and Sexual Activity  . Alcohol use: No  . Drug use: No  . Sexual activity: Not on file

## 2018-06-17 ENCOUNTER — Encounter (INDEPENDENT_AMBULATORY_CARE_PROVIDER_SITE_OTHER): Payer: Self-pay | Admitting: Physical Medicine and Rehabilitation

## 2018-06-17 MED ORDER — BUPIVACAINE HCL 0.25 % IJ SOLN
4.0000 mL | INTRAMUSCULAR | Status: AC | PRN
  Administered 2018-06-16: 4 mL via INTRA_ARTICULAR

## 2018-06-17 MED ORDER — LIDOCAINE HCL 2 % IJ SOLN
4.0000 mL | INTRAMUSCULAR | Status: AC | PRN
  Administered 2018-06-16: 4 mL

## 2018-06-17 MED ORDER — TRIAMCINOLONE ACETONIDE 40 MG/ML IJ SUSP
80.0000 mg | INTRAMUSCULAR | Status: AC | PRN
  Administered 2018-06-16: 80 mg via INTRA_ARTICULAR

## 2018-06-19 ENCOUNTER — Telehealth: Payer: Self-pay | Admitting: *Deleted

## 2018-06-19 NOTE — Telephone Encounter (Signed)
Spoke with pt yesterday. See chest [place] result.  Pt being scheduled to see Dr. Lovena Le.

## 2018-06-19 NOTE — Telephone Encounter (Signed)
Staff message received after OV with Cecilie Kicks, NP 06/06/18.  From: Loren Racer, LPN  Sent: 6/56/8127  1:56 PM  To: Melissa A Lolly Mustache,   Cecilie Kicks, NP wants pt to be seen by Dr. Lovena Le for tenderness in area of loop. I believe she spoke to him about this. Pt is aware you will be calling. She is leaving Sunday to go out of town for 2 weeks.   Thank you!  Anderson Malta     Patient would like her ILR removed d/t pain. Soft tissue US done- no abnormalities.   LMOM to call back. Needs appt with Dr. Lovena Le.

## 2018-06-19 NOTE — Telephone Encounter (Signed)
Ms. Tutton calling back. She only wants to see Dr. Lovena Le if he recommends loop recorder removal. She will wait and call back if she would like an appointment with him to discuss ILR removal. Follow up as scheduled.

## 2018-06-20 ENCOUNTER — Ambulatory Visit (INDEPENDENT_AMBULATORY_CARE_PROVIDER_SITE_OTHER): Payer: Medicare Other | Admitting: Orthopaedic Surgery

## 2018-06-20 ENCOUNTER — Encounter (INDEPENDENT_AMBULATORY_CARE_PROVIDER_SITE_OTHER): Payer: Self-pay | Admitting: Orthopaedic Surgery

## 2018-06-20 ENCOUNTER — Other Ambulatory Visit (INDEPENDENT_AMBULATORY_CARE_PROVIDER_SITE_OTHER): Payer: Self-pay | Admitting: Radiology

## 2018-06-20 DIAGNOSIS — M7662 Achilles tendinitis, left leg: Secondary | ICD-10-CM

## 2018-06-20 DIAGNOSIS — I639 Cerebral infarction, unspecified: Secondary | ICD-10-CM | POA: Diagnosis not present

## 2018-06-20 MED ORDER — DICLOFENAC SODIUM 1 % TD GEL: TRANSDERMAL | 3 refills | Status: DC

## 2018-06-20 NOTE — Progress Notes (Signed)
Office Visit Note   Patient: April Armstrong           Date of Birth: 06/08/1951           MRN: [id number] Visit Date: 06/20/2018              Requested by: Carol Ada, [address] [place], [place] [address] PCP: Carol Ada, MD   Assessment & Plan: Visit Diagnoses:  1. Tendonitis, Achilles, left     Plan: Early Achilles tendinitis left ankle..  Long discussion regarding treatment options including ice or heat, comfortable shoes with a heel.  Voltaren gel.  Consider physical therapy for ultrasound return as needed  Follow-Up Instructions: Return if symptoms worsen or fail to improve.   Orders:  No orders of the defined types were placed in this encounter.  Meds ordered this encounter  Medications  . diclofenac sodium (VOLTAREN) 1 % GEL    Sig: APPLY 2-4 GRAMS TO LARGE JOINT AREA UP TO FOUR TIMES A DAY AS NEEDED    Dispense:  2 Tube    Refill:  3      Procedures: No procedures performed   Clinical Data: No additional findings.   Subjective: No chief complaint on file. April Armstrong recently was seen by Dr. Ernestina Patches for injection of the greater trochanteric region of her hip.  She had mentioned that she was having some trouble with the left heel for this.  Her past history is significant in that she underwent surgery right heel while living in [place] for an apparent partial Achilles tendon tear.  She had considerable difficulty with wound healing and required a skin graft experiencing some similar pain on the left side mid Achilles tendon.  No injury or trauma.  No swelling.  No masses  HPI  Review of Systems  Constitutional: Positive for fatigue. Negative for fever.  HENT: Negative for ear pain.   Eyes: Negative for pain.  Respiratory: Negative for cough and shortness of breath.   Cardiovascular: Negative for leg swelling.  Gastrointestinal: Negative for constipation and diarrhea.  Genitourinary: Negative for difficulty urinating.    Musculoskeletal: Negative for back pain and neck pain.  Skin: Negative for rash.  Allergic/Immunologic: Negative for food allergies.  Neurological: Negative for weakness and numbness.  Hematological: Does not bruise/bleed easily.  Psychiatric/Behavioral: Negative for sleep disturbance.     Objective: Vital Signs: There were no vitals taken for this visit.  Physical Exam  Constitutional: She is oriented to person, place, and time. She appears well-developed and well-nourished.  HENT:  Mouth/Throat: Oropharynx is clear and moist.  Eyes: Pupils are equal, round, and reactive to light. EOM are normal.  Pulmonary/Chest: Effort normal.  Neurological: She is alert and oriented to person, place, and time.  Skin: Skin is warm and dry.  Psychiatric: She has a normal mood and affect. Her behavior is normal.    Ortho Exam awake alert and oriented x3.  Comfortable sitting.  Scarring about the left Achilles tendon is is complete.  Skin is intact.  No swelling.  There is some tenderness about an inch along the left Achilles tendon several inches proximal to the Achilles insertion.  No mass.  No redness.  No swelling.  No loss of motion.  Neurovascular exam intact.  No ankle pain.  No calf pain  Specialty Comments:  No specialty comments available.  Imaging: No results found.   PMFS History: Patient Active Problem List   Diagnosis Date Noted  . Trigger  finger, left ring finger 03/10/2018  . BPPV (benign paroxysmal positional vertigo) 10/22/2016  . Anxiety state 06/22/2016  . Tachycardia 04/25/2016  . Bilateral occipital neuralgia   . Cryptogenic stroke ([place]) 04/13/2016  . Cerebrovascular accident (CVA) due to thrombosis of right middle cerebral artery ([place]) 04/13/2016  . Cerebral infarction ([place])   . Thyroid activity decreased   . Depression   . Hypothyroidism   . GERD (gastroesophageal reflux disease)   . Hyperlipidemia   . Hypertension   . IBS (irritable bowel syndrome)    Past  Medical History:  Diagnosis Date  . Depression   . GERD (gastroesophageal reflux disease)   . Hypercholesterolemia   . Hypertension   . Hypothyroidism   . IBS (irritable bowel syndrome)   . Stroke ([place])   . Thyroid disease     Family History  Problem Relation Age of Onset  . Dementia Mother   . Hypercholesterolemia Mother   . Heart disease Father   . Stroke Father   . Heart disease Brother   . Congestive Heart Failure Sister   . Breast cancer Paternal Aunt     Past Surgical History:  Procedure Laterality Date  . ABDOMINAL HYSTERECTOMY    . APPENDECTOMY    . CESAREAN SECTION    . CHOLECYSTECTOMY    . EP IMPLANTABLE DEVICE N/A 12/28/2016   Procedure: Loop Recorder Insertion;  Surgeon: Evans Lance, MD;  Location: [facility];  Service: Cardiovascular;  Laterality: N/A;  . THYROIDECTOMY    . TRIGGER FINGER RELEASE     Social History   Occupational History  . Not on file  Tobacco Use  . Smoking status: Never Smoker  . Smokeless tobacco: Current User    Types: Chew  Substance and Sexual Activity  . Alcohol use: No  . Drug use: No  . Sexual activity: Not on file

## 2018-07-10 ENCOUNTER — Ambulatory Visit (INDEPENDENT_AMBULATORY_CARE_PROVIDER_SITE_OTHER): Payer: Medicare Other | Admitting: *Deleted

## 2018-07-10 DIAGNOSIS — I639 Cerebral infarction, unspecified: Secondary | ICD-10-CM | POA: Diagnosis not present

## 2018-07-10 NOTE — Progress Notes (Signed)
[facility] Summary Report / Loop Recorder 

## 2018-07-17 DIAGNOSIS — D225 Melanocytic nevi of trunk: Secondary | ICD-10-CM | POA: Diagnosis not present

## 2018-07-17 DIAGNOSIS — Z85828 Personal history of other malignant neoplasm of skin: Secondary | ICD-10-CM | POA: Diagnosis not present

## 2018-07-17 DIAGNOSIS — L570 Actinic keratosis: Secondary | ICD-10-CM | POA: Diagnosis not present

## 2018-07-17 DIAGNOSIS — D2271 Melanocytic nevi of right lower limb, including hip: Secondary | ICD-10-CM | POA: Diagnosis not present

## 2018-07-17 DIAGNOSIS — L821 Other seborrheic keratosis: Secondary | ICD-10-CM | POA: Diagnosis not present

## 2018-07-28 LAB — CUP PACEART REMOTE DEVICE CHECK
Implantable Pulse Generator Implant Date: 20180202
MDC IDC SESS DTM: 20190713033720

## 2018-07-29 DIAGNOSIS — Z6838 Body mass index (BMI) 38.0-38.9, adult: Secondary | ICD-10-CM | POA: Diagnosis not present

## 2018-07-29 DIAGNOSIS — F339 Major depressive disorder, recurrent, unspecified: Secondary | ICD-10-CM | POA: Diagnosis not present

## 2018-07-29 DIAGNOSIS — Z0000 Encounter for general adult medical examination without abnormal findings: Secondary | ICD-10-CM | POA: Diagnosis not present

## 2018-07-29 DIAGNOSIS — Z23 Encounter for immunization: Secondary | ICD-10-CM | POA: Diagnosis not present

## 2018-07-29 DIAGNOSIS — E7800 Pure hypercholesterolemia, unspecified: Secondary | ICD-10-CM | POA: Diagnosis not present

## 2018-07-29 DIAGNOSIS — E890 Postprocedural hypothyroidism: Secondary | ICD-10-CM | POA: Diagnosis not present

## 2018-07-29 DIAGNOSIS — K219 Gastro-esophageal reflux disease without esophagitis: Secondary | ICD-10-CM | POA: Diagnosis not present

## 2018-07-29 DIAGNOSIS — Z1389 Encounter for screening for other disorder: Secondary | ICD-10-CM | POA: Diagnosis not present

## 2018-07-29 DIAGNOSIS — G4700 Insomnia, unspecified: Secondary | ICD-10-CM | POA: Diagnosis not present

## 2018-07-29 DIAGNOSIS — I10 Essential (primary) hypertension: Secondary | ICD-10-CM | POA: Diagnosis not present

## 2018-08-12 ENCOUNTER — Ambulatory Visit (INDEPENDENT_AMBULATORY_CARE_PROVIDER_SITE_OTHER): Payer: Medicare Other | Admitting: *Deleted

## 2018-08-12 DIAGNOSIS — I6340 Cerebral infarction due to embolism of unspecified cerebral artery: Secondary | ICD-10-CM

## 2018-08-12 NOTE — Progress Notes (Signed)
[facility] Summary Report / Loop Recorder 

## 2018-08-14 ENCOUNTER — Telehealth: Payer: Self-pay | Admitting: Cardiology

## 2018-08-14 NOTE — Telephone Encounter (Signed)
LMOVM requesting that pt send manual transmission b/c home monitor has not updated in at least 14 days.    

## 2018-08-18 LAB — CUP PACEART REMOTE DEVICE CHECK
Date Time Interrogation Session: 20190815043958
MDC IDC PG IMPLANT DT: 20180202

## 2018-08-19 ENCOUNTER — Telehealth: Payer: Self-pay

## 2018-08-19 NOTE — Telephone Encounter (Signed)
LMOVM requesting that pt send manual transmission b/c home monitor has not updated in at least 14 days.    

## 2018-08-24 LAB — CUP PACEART REMOTE DEVICE CHECK
Implantable Pulse Generator Implant Date: 20180202
MDC IDC SESS DTM: 20190917121044

## 2018-09-15 ENCOUNTER — Ambulatory Visit (INDEPENDENT_AMBULATORY_CARE_PROVIDER_SITE_OTHER): Payer: Medicare Other | Admitting: *Deleted

## 2018-09-15 DIAGNOSIS — I6340 Cerebral infarction due to embolism of unspecified cerebral artery: Secondary | ICD-10-CM

## 2018-09-15 NOTE — Progress Notes (Signed)
[facility] Summary Report / Loop Recorder 

## 2018-10-03 ENCOUNTER — Emergency Department (HOSPITAL_COMMUNITY)
Admission: EM | Admit: 2018-10-03 | Discharge: 2018-10-03 | Disposition: A | Discharge status: Home / Self Care | Payer: Medicare Other | Attending: Emergency Medicine | Admitting: Emergency Medicine

## 2018-10-03 DIAGNOSIS — I10 Essential (primary) hypertension: Secondary | ICD-10-CM | POA: Insufficient documentation

## 2018-10-03 DIAGNOSIS — Z7982 Long term (current) use of aspirin: Secondary | ICD-10-CM | POA: Diagnosis not present

## 2018-10-03 DIAGNOSIS — Z95810 Presence of automatic (implantable) cardiac defibrillator: Secondary | ICD-10-CM | POA: Diagnosis not present

## 2018-10-03 DIAGNOSIS — R040 Epistaxis: Secondary | ICD-10-CM | POA: Insufficient documentation

## 2018-10-03 DIAGNOSIS — Z79899 Other long term (current) drug therapy: Secondary | ICD-10-CM | POA: Insufficient documentation

## 2018-10-03 DIAGNOSIS — F17220 Nicotine dependence, chewing tobacco, uncomplicated: Secondary | ICD-10-CM | POA: Insufficient documentation

## 2018-10-03 DIAGNOSIS — E039 Hypothyroidism, unspecified: Secondary | ICD-10-CM | POA: Diagnosis not present

## 2018-10-03 LAB — CBC
HCT: 42.7 % (ref 36.0–46.0)
Hemoglobin: 13.3 g/dL (ref 12.0–15.0)
MCH: 29.7 pg (ref 26.0–34.0)
MCHC: 31.1 g/dL (ref 30.0–36.0)
MCV: 95.3 fL (ref 80.0–100.0)
NRBC: 0 % (ref 0.0–0.2)
PLATELETS: 293 10*3/uL (ref 150–400)
RBC: 4.48 MIL/uL (ref 3.87–5.11)
RDW: 12.7 % (ref 11.5–15.5)
WBC: 6.4 10*3/uL (ref 4.0–10.5)

## 2018-10-03 LAB — CUP PACEART REMOTE DEVICE CHECK
Date Time Interrogation Session: 20191020123854
MDC IDC PG IMPLANT DT: 20180202

## 2018-10-03 LAB — PROTIME-INR
INR: 1.01
PROTHROMBIN TIME: 13.2 s (ref 11.4–15.2)

## 2018-10-03 MED ORDER — LIDOCAINE HCL URETHRAL/MUCOSAL 2 % EX GEL
1.0000 "application " | Freq: Once | CUTANEOUS | Status: DC
  Filled 2018-10-03: qty 20

## 2018-10-03 MED ORDER — OXYMETAZOLINE HCL 0.05 % NA SOLN
1.0000 | Freq: Once | NASAL | Status: DC
  Filled 2018-10-03: qty 15

## 2018-10-03 MED ORDER — BENZOCAINE 20 % MT AERO
INHALATION_SPRAY | Freq: Once | OROMUCOSAL | Status: DC
  Filled 2018-10-03: qty 57

## 2018-10-03 NOTE — ED Notes (Signed)
Patient verbalizes understanding of discharge instructions. Opportunity for questioning and answers were provided. Armband removed by staff, pt discharged from ED.  

## 2018-10-03 NOTE — ED Triage Notes (Addendum)
Pt arrived via POV with husband from home after experiencing repeated prolonged nose bleeds over the past 3 weeks. Bleeding began this am but has since stopped. Denies emesis at time of triage. Pt is alert and oriented with no complaints at this time.

## 2018-10-03 NOTE — ED Provider Notes (Signed)
[facility] EMERGENCY DEPARTMENT Provider Note   CSN: [id number] Arrival date & time: 10/03/18  1615     History   Chief Complaint Chief Complaint  Patient presents with  . Epistaxis    HPI April Armstrong is a 67 y.o. female.  HPI  67 year old female with past medical history as below here with epistaxis.  Patient states that once a week for the last 3 weeks, she has had intermittent nosebleeding.  She describes it as a warm, bleeding sensation in her right nose that occasionally runs in the back of her throat.  These symptoms have lasted up to 10 minutes at a time.  She applied pressure and has been able to stop bleeding.  However, she noticed this morning, she had large clots in her nose, and felt like she was bleeding down the back of her throat.  She called her PCP told her come to the ED.  No active bleeding at this time.  No blood thinner use.  No chest pain or shortness of breath.  No history of recurrent sinus infections.  No trauma to the nose.  No history of recurrent epistaxis.  No easy bruising or bleeding.   Past Medical History:  Diagnosis Date  . Depression   . GERD (gastroesophageal reflux disease)   . Hypercholesterolemia   . Hypertension   . Hypothyroidism   . IBS (irritable bowel syndrome)   . Stroke ([place])   . Thyroid disease     Patient Active Problem List   Diagnosis Date Noted  . Trigger finger, left ring finger 03/10/2018  . BPPV (benign paroxysmal positional vertigo) 10/22/2016  . Anxiety state 06/22/2016  . Tachycardia 04/25/2016  . Bilateral occipital neuralgia   . Cryptogenic stroke ([place]) 04/13/2016  . Cerebrovascular accident (CVA) due to thrombosis of right middle cerebral artery ([place]) 04/13/2016  . Cerebral infarction ([place])   . Thyroid activity decreased   . Depression   . Hypothyroidism   . GERD (gastroesophageal reflux disease)   . Hyperlipidemia   . Hypertension   . IBS (irritable bowel syndrome)     Past  Surgical History:  Procedure Laterality Date  . ABDOMINAL HYSTERECTOMY    . APPENDECTOMY    . CESAREAN SECTION    . CHOLECYSTECTOMY    . EP IMPLANTABLE DEVICE N/A 12/28/2016   Procedure: Loop Recorder Insertion;  Surgeon: Evans Lance, MD;  Location: [facility];  Service: Cardiovascular;  Laterality: N/A;  . THYROIDECTOMY    . TRIGGER FINGER RELEASE       OB History   None      Home Medications    Prior to Admission medications   Medication Sig Start Date End Date Taking? Authorizing Provider  aspirin 325 MG tablet Take 1 tablet (325 mg total) by mouth daily. 04/14/16   Geradine Girt, DO  atorvastatin (LIPITOR) 80 MG tablet Take 1 tablet (80 mg total) by mouth at bedtime. 04/14/16   Geradine Girt, DO  buPROPion (WELLBUTRIN XL) 300 MG 24 hr tablet Take 300 mg by mouth daily.  03/28/16   [provider]  diclofenac sodium (VOLTAREN) 1 % GEL Apply 2-3 times a day to large joint area 03/10/18   Garald Balding, MD  diclofenac sodium (VOLTAREN) 1 % GEL APPLY 2-4 GRAMS TO LARGE JOINT AREA UP TO FOUR TIMES A DAY AS NEEDED 06/20/18   Garald Balding, MD  lamoTRIgine (LAMICTAL) 100 MG tablet Take 100 mg by mouth daily.  02/10/16   [provider]  levothyroxine (SYNTHROID, LEVOTHROID) 112 MCG tablet Take 112 mcg by mouth daily before breakfast.    [provider]  LORazepam (ATIVAN) 0.5 MG tablet Take 0.5 mg by mouth every 6 (six) hours as needed for anxiety.  03/27/16   [provider]  metoprolol succinate (TOPROL-XL) 25 MG 24 hr tablet Take 25 mg by mouth daily.  02/10/16   [provider]  omeprazole (PRILOSEC) 40 MG capsule Take 40 mg by mouth daily.  04/09/16   [provider]  RESTASIS 0.05 % ophthalmic emulsion Place 1 drop into both eyes 2 (two) times daily.  02/15/16   [provider]  sertraline (ZOLOFT) 100 MG tablet Take 100 mg by mouth daily.  03/28/16   [provider]  spironolactone (ALDACTONE) 25 MG  tablet Take 25 mg by mouth daily.  01/09/16   [provider]  traZODone (DESYREL) 150 MG tablet Take 75 mg by mouth at bedtime.  02/05/16   [provider]  vitamin B-12 (CYANOCOBALAMIN) 100 MCG tablet Take 100 mcg by mouth at bedtime.    [provider]    Family History Family History  Problem Relation Age of Onset  . Dementia Mother   . Hypercholesterolemia Mother   . Heart disease Father   . Stroke Father   . Heart disease Brother   . Congestive Heart Failure Sister   . Breast cancer Paternal Aunt     Social History Social History   Tobacco Use  . Smoking status: Never Smoker  . Smokeless tobacco: Current User    Types: Chew  Substance Use Topics  . Alcohol use: No  . Drug use: No     Allergies   Patient has no known allergies.   Review of Systems Review of Systems  Constitutional: Negative for chills and fever.  HENT: Positive for nosebleeds. Negative for congestion, rhinorrhea and sore throat.   Eyes: Negative for visual disturbance.  Respiratory: Negative for cough, shortness of breath and wheezing.   Cardiovascular: Negative for chest pain and leg swelling.  Gastrointestinal: Negative for abdominal pain, diarrhea, nausea and vomiting.  Genitourinary: Negative for dysuria, flank pain, vaginal bleeding and vaginal discharge.  Musculoskeletal: Negative for neck pain.  Skin: Negative for rash.  Allergic/Immunologic: Negative for immunocompromised state.  Neurological: Negative for syncope and headaches.  Hematological: Does not bruise/bleed easily.  All other systems reviewed and are negative.    Physical Exam Updated Vital Signs BP 101/80 (BP Location: Right Arm)   Pulse 69   Resp 16   SpO2 100%   Physical Exam  Constitutional: She is oriented to person, place, and time. She appears well-developed and well-nourished. No distress.  HENT:  Head: Normocephalic and atraumatic.  There is an exposed small capillary bed at the  right anterior naris, at [facility] plexus, with intact clot and stigmata of recent bleeding but no active bleeding.  No apparent bleeding in the posterior oropharynx.  Eyes: Conjunctivae are normal.  Neck: Neck supple.  Cardiovascular: Normal rate, regular rhythm and normal heart sounds. Exam reveals no friction rub.  No murmur heard. Pulmonary/Chest: Effort normal and breath sounds normal. No respiratory distress. She has no wheezes. She has no rales.  Abdominal: She exhibits no distension.  Musculoskeletal: She exhibits no edema.  Neurological: She is alert and oriented to person, place, and time. She exhibits normal muscle tone.  Skin: Skin is warm. Capillary refill takes less than 2 seconds.  Psychiatric: She has  a normal mood and affect.  Nursing note and vitals reviewed.    ED Treatments / Results  Labs (all labs ordered are listed, but only abnormal results are displayed) Labs Reviewed  CBC  PROTIME-INR    EKG None  Radiology No results found.  Procedures .Epistaxis Management Date/Time: 10/03/2018 6:43 PM Performed by: Duffy Bruce, MD Authorized by: Duffy Bruce, MD   Consent:    Consent obtained:  Verbal   Consent given by:  Patient   Risks discussed:  Bleeding, infection, nasal injury and pain   Alternatives discussed:  Alternative treatment and observation Anesthesia (see MAR for exact dosages):    Anesthesia method:  Topical application   Topical anesthetic:  Lidocaine gel Procedure details:    Treatment site:  R anterior   Treatment method:  Silver nitrate   Treatment complexity:  Limited   Treatment episode: initial   Post-procedure details:    Assessment:  Bleeding stopped   Patient tolerance of procedure:  Tolerated well, no immediate complications   (including critical care time)   Transnasal Flexible Scope: Following anesthesia with topical lidocaine, the slim kaleidoscope flex the scope was introduced atraumatically into the right  nares.  There is a visualized, exposed capillary bed with intact clot at the right Kiesselbach's plexus.  The nasal mucosa was pale and edematous.  Posteriorly, there was no evidence of active posterior bleed or large clot.  There was blood extending from the anterior plexus, but no active bleeding. Remainder of OP clear.  Medications Ordered in ED Medications - No data to display   Initial Impression / Assessment and Plan / ED Course  I have reviewed the triage vital signs and the nursing notes.  Pertinent labs & imaging results that were available during my care of the patient were reviewed by me and considered in my medical decision making (see chart for details).     67 year old female here with right anterior epistaxis.  Transnasal flexible laryngoscope was used to visualize the posterior oropharynx and posterior nares with no evidence of active posterior bleed.  The small, expose capillary bed  was anesthetized then cauterized with silver nitrate.  Patient tolerated well.  No active bleeding.  Hemoglobin stable.  She is on blood thinners.  Will discharge with instructions on appropriate management of nosebleeds, humidification, and outpatient follow-up.  She will follow-up with her PCP with ENT referral as needed.  Final Clinical Impressions(s) / ED Diagnoses   Final diagnoses:  Epistaxis    ED Discharge Orders    None       Duffy Bruce, MD 10/03/18 2229

## 2018-10-03 NOTE — Discharge Instructions (Addendum)
If your nose bleeds again: ° °Step 1: Blow your nose gently to remove any blood clots ° °Step 2: Apply FIRM, DIRECT PRESSURE to your NASAL BRIDGE (this is the flat part of your nose above your nostrils) for 5 minutes straight. Lean your head forward to help prevent blood from going down your throat. ° °Step 3: Remove pressure and see if your nose is still bleeding.  If it is, you can gently blow your nose again to remove any clots. ° °Step 4: Use the Afrin/oxymetazoline nasal spray that I have given you.  Spray 2 sprays into both nostrils.  Then, apply FIRM, DIRECT PRESSURE to your nasal bridge again for 5 minutes. ° °Step 5: If bleeding is not controlled, continue holding pressure and seek medical attention ° ° °Use a small amount of vaseline in your nostrils to help moisturize. You can also use over-the-counter SALINE SPRAY to help prevent your nose bleeds. ° °

## 2018-10-08 DIAGNOSIS — E890 Postprocedural hypothyroidism: Secondary | ICD-10-CM | POA: Diagnosis not present

## 2018-10-17 ENCOUNTER — Ambulatory Visit (INDEPENDENT_AMBULATORY_CARE_PROVIDER_SITE_OTHER): Payer: Medicare Other

## 2018-10-17 DIAGNOSIS — I6340 Cerebral infarction due to embolism of unspecified cerebral artery: Secondary | ICD-10-CM | POA: Diagnosis not present

## 2018-10-17 NOTE — Progress Notes (Signed)
[facility] Summary Report / Loop Recorder 

## 2018-11-05 ENCOUNTER — Telehealth: Payer: Self-pay | Admitting: Internal Medicine

## 2018-11-05 ENCOUNTER — Telehealth: Payer: Self-pay | Admitting: Cardiology

## 2018-11-05 NOTE — Telephone Encounter (Signed)
New message    Pt is calling to say that she is out of state and left her tranmission box here in  and that's why transmissions have not been received. She said she will be home on the 18th.

## 2018-11-05 NOTE — Telephone Encounter (Signed)
LMOVM requesting that pt send manual transmission b/c home monitor has not updated in at least 14 days.    

## 2018-11-05 NOTE — Telephone Encounter (Signed)
Noted in [place].

## 2018-11-06 ENCOUNTER — Telehealth (INDEPENDENT_AMBULATORY_CARE_PROVIDER_SITE_OTHER): Payer: Self-pay | Admitting: Physical Medicine and Rehabilitation

## 2018-11-10 NOTE — Telephone Encounter (Signed)
Error

## 2018-11-11 ENCOUNTER — Encounter: Payer: Self-pay | Admitting: Cardiology

## 2018-11-11 ENCOUNTER — Telehealth: Payer: Self-pay

## 2018-11-11 NOTE — Telephone Encounter (Signed)
LMOVM requesting that pt send manual transmission b/c home monitor has not updated in at least 14 days.    

## 2018-11-19 LAB — CUP PACEART REMOTE DEVICE CHECK
MDC IDC PG IMPLANT DT: 20180202
MDC IDC SESS DTM: 20191225143952

## 2018-11-20 ENCOUNTER — Ambulatory Visit (INDEPENDENT_AMBULATORY_CARE_PROVIDER_SITE_OTHER): Payer: Medicare Other

## 2018-11-20 DIAGNOSIS — I6340 Cerebral infarction due to embolism of unspecified cerebral artery: Secondary | ICD-10-CM

## 2018-11-20 NOTE — Progress Notes (Signed)
[facility] Summary Report / Loop Recorder 

## 2018-12-07 LAB — CUP PACEART REMOTE DEVICE CHECK
Date Time Interrogation Session: 20191122133601
MDC IDC PG IMPLANT DT: 20180202

## 2018-12-19 ENCOUNTER — Telehealth (INDEPENDENT_AMBULATORY_CARE_PROVIDER_SITE_OTHER): Payer: Self-pay | Admitting: *Deleted

## 2018-12-22 ENCOUNTER — Ambulatory Visit (INDEPENDENT_AMBULATORY_CARE_PROVIDER_SITE_OTHER): Payer: Medicare Other

## 2018-12-22 DIAGNOSIS — I6340 Cerebral infarction due to embolism of unspecified cerebral artery: Secondary | ICD-10-CM

## 2018-12-22 NOTE — Telephone Encounter (Signed)
The last time we saw her was actually 05/2018 and did Greater Troch injection. It was on right side. We can do OV and repeat that possibly or at least review next step which may be esi.

## 2018-12-23 DIAGNOSIS — I83813 Varicose veins of bilateral lower extremities with pain: Secondary | ICD-10-CM | POA: Diagnosis not present

## 2018-12-23 LAB — CUP PACEART REMOTE DEVICE CHECK
Date Time Interrogation Session: 20200127144240
Implantable Pulse Generator Implant Date: 20180202

## 2018-12-23 NOTE — Progress Notes (Signed)
[facility] Summary Report / Loop Recorder 

## 2018-12-23 NOTE — Telephone Encounter (Signed)
Pt is scheduled for 01/07/2019 for OV

## 2018-12-30 DIAGNOSIS — J069 Acute upper respiratory infection, unspecified: Secondary | ICD-10-CM | POA: Diagnosis not present

## 2018-12-30 DIAGNOSIS — B9789 Other viral agents as the cause of diseases classified elsewhere: Secondary | ICD-10-CM | POA: Diagnosis not present

## 2018-12-30 DIAGNOSIS — R05 Cough: Secondary | ICD-10-CM | POA: Diagnosis not present

## 2019-01-07 ENCOUNTER — Ambulatory Visit (INDEPENDENT_AMBULATORY_CARE_PROVIDER_SITE_OTHER): Payer: Self-pay | Admitting: Physical Medicine and Rehabilitation

## 2019-01-13 DIAGNOSIS — J101 Influenza due to other identified influenza virus with other respiratory manifestations: Secondary | ICD-10-CM | POA: Diagnosis not present

## 2019-01-13 DIAGNOSIS — R05 Cough: Secondary | ICD-10-CM | POA: Diagnosis not present

## 2019-01-13 DIAGNOSIS — R5383 Other fatigue: Secondary | ICD-10-CM | POA: Diagnosis not present

## 2019-01-13 DIAGNOSIS — R5381 Other malaise: Secondary | ICD-10-CM | POA: Diagnosis not present

## 2019-01-24 LAB — CUP PACEART REMOTE DEVICE CHECK
Implantable Pulse Generator Implant Date: 20180202
MDC IDC SESS DTM: 20200229150709

## 2019-01-26 ENCOUNTER — Ambulatory Visit (INDEPENDENT_AMBULATORY_CARE_PROVIDER_SITE_OTHER): Payer: Medicare Other | Admitting: *Deleted

## 2019-01-26 DIAGNOSIS — I639 Cerebral infarction, unspecified: Secondary | ICD-10-CM | POA: Diagnosis not present

## 2019-01-29 ENCOUNTER — Ambulatory Visit (INDEPENDENT_AMBULATORY_CARE_PROVIDER_SITE_OTHER): Payer: Self-pay | Admitting: Physical Medicine and Rehabilitation

## 2019-02-02 NOTE — Progress Notes (Signed)
[facility] Summary Report / Loop Recorder 

## 2019-02-16 DIAGNOSIS — I8312 Varicose veins of left lower extremity with inflammation: Secondary | ICD-10-CM | POA: Diagnosis not present

## 2019-02-16 DIAGNOSIS — I8311 Varicose veins of right lower extremity with inflammation: Secondary | ICD-10-CM | POA: Diagnosis not present

## 2019-02-17 ENCOUNTER — Ambulatory Visit (INDEPENDENT_AMBULATORY_CARE_PROVIDER_SITE_OTHER): Payer: Medicare Other | Admitting: Physical Medicine and Rehabilitation

## 2019-02-17 ENCOUNTER — Other Ambulatory Visit: Payer: Self-pay

## 2019-02-17 ENCOUNTER — Encounter (INDEPENDENT_AMBULATORY_CARE_PROVIDER_SITE_OTHER): Payer: Self-pay | Admitting: Physical Medicine and Rehabilitation

## 2019-02-17 DIAGNOSIS — M47816 Spondylosis without myelopathy or radiculopathy, lumbar region: Secondary | ICD-10-CM

## 2019-02-17 DIAGNOSIS — M48061 Spinal stenosis, lumbar region without neurogenic claudication: Secondary | ICD-10-CM | POA: Diagnosis not present

## 2019-02-17 DIAGNOSIS — M5416 Radiculopathy, lumbar region: Secondary | ICD-10-CM

## 2019-02-17 DIAGNOSIS — M7061 Trochanteric bursitis, right hip: Secondary | ICD-10-CM

## 2019-02-17 DIAGNOSIS — M5441 Lumbago with sciatica, right side: Secondary | ICD-10-CM | POA: Diagnosis not present

## 2019-02-17 DIAGNOSIS — G8929 Other chronic pain: Secondary | ICD-10-CM

## 2019-02-17 DIAGNOSIS — I639 Cerebral infarction, unspecified: Secondary | ICD-10-CM

## 2019-02-17 MED ORDER — MELOXICAM 15 MG PO TABS: 15.0000 mg | ORAL_TABLET | Freq: Every day | ORAL | 0 refills | Status: DC

## 2019-02-17 NOTE — Progress Notes (Signed)
April Armstrong - 68 y.o. female MRN [id number]  Date of birth: 11/29/1950  Office Visit Note: Visit Date: 02/17/2019 PCP: Carol Ada, MD Referred by: Carol Ada, MD  Subjective: Chief Complaint  Patient presents with   Lower Back - Pain   Right Hip - Pain   Right Leg - Pain   HPI: April Armstrong is a 68 y.o. female who comes in today For reevaluation of chronic worsening severe low back pain and some referral down the right hip and leg.  Her history with [place] is over the last many years she has had chronic mechanical back pain which is mainly been facet mediated low back pain with some history of greater trochanteric bursitis on the right.  She had questionable sacroiliac joint dysfunction at one point.  She has been through physical therapy in the past and tries to stay active.  She is morbidly obese.  She comes in today stating that is just progressively gotten worse over the last couple of months and is mainly centered in the low back and right hip and leg.  She does endorse some pain down the leg past the knee but no paresthesias.  No left-sided complaints in the leg.  No focal weakness.  She does endorse a history of multiple falls but has not had any big traumatic falls lately.  Her case is complicated by history of cryptogenic stroke and irritable bowel syndrome and anxiety/depression.  Severe pain is with standing and walking.  She does get relief when she rests or sits.  She has a hard time to standing for any length of time washing dishes or making the bed.  Rates her average pain is 10 out of 10.  She also has a history of venous issues in the legs.  She does see a vein specialist and has had procedures on the legs in the past that was beneficial.  She recently saw them for testing and they did complete what sounds like Dopplers of the lower extremities.  She supposed to see them on Monday for further evaluation.  She reports her pain in the back and leg is constant dull and  aching.  She uses ibuprofen with some relief.  Review of Systems  Musculoskeletal: Positive for back pain and joint pain.       Right hip and leg pain   Otherwise per HPI.  Assessment & Plan: Visit Diagnoses:  1. Chronic bilateral low back pain with right-sided sciatica   2. Spondylosis without myelopathy or radiculopathy, lumbar region   3. Greater trochanteric bursitis, right   4. Lumbar radiculopathy   5. Bilateral stenosis of lateral recess of lumbar spine     Plan: Findings:  Chronic history of low back pain and bilateral hip and thigh pain consistent more with facet arthropathy of the lower spine along with greater trochanteric bursitis and pain syndrome.  She has some lateral recess narrowing but no high-grade stenosis and I do not think this is really a radicular complaint although it does go past the knee.  She also has issues with problems with her veins in the legs and vascular problems.  She is follow-up Monday for that.  It is hard to know if this is related to anything with the venous structure but there can talk to her on Monday.  In terms of her back pain I think she would do well with repeat radiofrequency ablation this is been several years since we did that.  Right now although I think  this is sort of a sprain strain type of issue where she was walking the dogs and had increasing pain.  The first step will be to try meloxicam for the next 7 days along with muscle relaxer that we prescribed.  We given her some exercises to do and I want her to get a foam roller for her bursitis and we talked about exercises for that as well.  We will see her back as needed.  Plan would be radiofrequency ablation and/or greater trochanteric bursa injection.     Meds & Orders:  Meds ordered this encounter  Medications   meloxicam (MOBIC) 15 MG tablet    Sig: Take 1 tablet (15 mg total) by mouth daily. Take with food, for 2 weeks.    Dispense:  30 tablet    Refill:  0   No orders of the  defined types were placed in this encounter.   Follow-up: Return if symptoms worsen or fail to improve.   Procedures: No procedures performed  No notes on file   Clinical History: MRI LUMBAR SPINE WITHOUT CONTRAST  TECHNIQUE: Multiplanar, multisequence MR imaging of the lumbar spine was performed. No intravenous contrast was administered.  COMPARISON:  None.  FINDINGS: Vertebral body height is maintained. Trace anterolisthesis L4 on L5 due to facet arthropathy is noted. The conus medullaris is normal in signal and position. Imaged intra-abdominal contents are unremarkable.  T11-12 and T12-L1 are imaged in the sagittal plane only. T12-L1 is unremarkable. There is a shallow disc bulge at T11-12 but the central canal and foramina are open.  L1-2:  Negative.  L2-3:  Mild facet degenerative change.  Otherwise negative.  L3-4:  Mild facet degenerative change.  Otherwise negative.  L4-5: Moderately severe facet degenerative change is present. The disc is uncovered without bulging. The central canal is mildly narrowed. Mild to moderate foraminal narrowing is more notable on the left.  L5-S1: There is some facet degenerative change. No disc bulge or protrusion. The central canal and foramina are widely patent.  IMPRESSION: Mild spondylosis most notable at L4-5 where facet degenerative change results in trace anterolisthesis and there is mild to moderate foraminal narrowing, more notable on the left. The central canal is only mildly narrowed at this level. No nerve root compression is identified.   Electronically Signed   By: Inge Rise M.D.   On: 02/07/2016 13:03   She reports that she has never smoked. Her smokeless tobacco use includes chew. No results for input(s): HGBA1C, LABURIC in the last 8760 hours.  Objective:  VS:  HT:     WT:    BMI:      BP:    HR: bpm   TEMP: ( )   RESP:  Physical Exam Vitals signs and nursing note reviewed.    Constitutional:      General: She is not in acute distress.    Appearance: Normal appearance. She is well-developed. She is obese. She is not ill-appearing.  HENT:     Head: Normocephalic and atraumatic.  Eyes:     Conjunctiva/sclera: Conjunctivae normal.     Pupils: Pupils are equal, round, and reactive to light.  Cardiovascular:     Rate and Rhythm: Normal rate.     Pulses: Normal pulses.  Pulmonary:     Effort: Pulmonary effort is normal.  Musculoskeletal:     Right lower leg: No edema.     Left lower leg: No edema.     Comments: Patient ambulates without aid with  normal gait.  She does have pain with extension of the lumbar spine and pain over the right greater trochanter but not the left.  She has no pain with internal rotation of the hips but does have pain with external rotation of the right hip.  She has good distal strength without clonus.  Skin:    General: Skin is warm and dry.     Findings: No erythema or rash.  Neurological:     General: No focal deficit present.     Mental Status: She is alert and oriented to person, place, and time.     Sensory: No sensory deficit.     Motor: No abnormal muscle tone.     Coordination: Coordination normal.     Gait: Gait normal.  Psychiatric:        Mood and Affect: Mood normal.        Behavior: Behavior normal.     Ortho Exam Imaging: No results found.  Past Medical/Family/Surgical/Social History: Medications & Allergies reviewed per EMR, new medications updated. Patient Active Problem List   Diagnosis Date Noted   Trigger finger, left ring finger 03/10/2018   BPPV (benign paroxysmal positional vertigo) 10/22/2016   Anxiety state 06/22/2016   Tachycardia 04/25/2016   Bilateral occipital neuralgia    Cryptogenic stroke ([place]) 04/13/2016   Cerebrovascular accident (CVA) due to thrombosis of right middle cerebral artery ([place]) 04/13/2016   Cerebral infarction ([place])    Thyroid activity decreased    Depression     Hypothyroidism    GERD (gastroesophageal reflux disease)    Hyperlipidemia    Hypertension    IBS (irritable bowel syndrome)    Past Medical History:  Diagnosis Date   Depression    GERD (gastroesophageal reflux disease)    Hypercholesterolemia    Hypertension    Hypothyroidism    IBS (irritable bowel syndrome)    Stroke ([place])    Thyroid disease    Family History  Problem Relation Age of Onset   Dementia Mother    Hypercholesterolemia Mother    Heart disease Father    Stroke Father    Heart disease Brother    Congestive Heart Failure Sister    Breast cancer Paternal Aunt    Past Surgical History:  Procedure Laterality Date   ABDOMINAL HYSTERECTOMY     APPENDECTOMY     CESAREAN SECTION     CHOLECYSTECTOMY     EP IMPLANTABLE DEVICE N/A 12/28/2016   Procedure: Loop Recorder Insertion;  Surgeon: Evans Lance, MD;  Location: [facility];  Service: Cardiovascular;  Laterality: N/A;   THYROIDECTOMY     TRIGGER FINGER RELEASE     Social History   Occupational History   Not on file  Tobacco Use   Smoking status: Never Smoker   Smokeless tobacco: Current User    Types: Chew  Substance and Sexual Activity   Alcohol use: No   Drug use: No   Sexual activity: Not on file

## 2019-02-17 NOTE — Progress Notes (Signed)
  Numeric Pain Rating Scale and Functional Assessment Average Pain 10 Pain Right Now 3 My pain is constant, dull and aching Pain is worse with: walking, standing and some activites Pain improves with: rest, medication and sitting   In the last MONTH (on 0-10 scale) has pain interfered with the following?  1. General activity like being  able to carry out your everyday physical activities such as walking, climbing stairs, carrying groceries, or moving a chair?  Rating(10)  2. Relation with others like being able to carry out your usual social activities and roles such as  activities at home, at work and in your community. Rating(10)  3. Enjoyment of life such that you have  been bothered by emotional problems such as feeling anxious, depressed or irritable?  Rating(10)

## 2019-02-18 ENCOUNTER — Encounter (INDEPENDENT_AMBULATORY_CARE_PROVIDER_SITE_OTHER): Payer: Self-pay | Admitting: Physical Medicine and Rehabilitation

## 2019-02-25 LAB — CUP PACEART REMOTE DEVICE CHECK
Date Time Interrogation Session: 20200401152940
Implantable Pulse Generator Implant Date: 20180202

## 2019-02-26 ENCOUNTER — Other Ambulatory Visit: Payer: Self-pay

## 2019-02-26 ENCOUNTER — Ambulatory Visit (INDEPENDENT_AMBULATORY_CARE_PROVIDER_SITE_OTHER): Payer: Medicare Other | Admitting: *Deleted

## 2019-02-26 DIAGNOSIS — I639 Cerebral infarction, unspecified: Secondary | ICD-10-CM

## 2019-02-26 LAB — CUP PACEART REMOTE DEVICE CHECK
Date Time Interrogation Session: 20200402134948
Implantable Pulse Generator Implant Date: 20180202

## 2019-03-03 DIAGNOSIS — F339 Major depressive disorder, recurrent, unspecified: Secondary | ICD-10-CM | POA: Diagnosis not present

## 2019-03-03 DIAGNOSIS — E890 Postprocedural hypothyroidism: Secondary | ICD-10-CM | POA: Diagnosis not present

## 2019-03-03 DIAGNOSIS — I10 Essential (primary) hypertension: Secondary | ICD-10-CM | POA: Diagnosis not present

## 2019-03-03 DIAGNOSIS — K219 Gastro-esophageal reflux disease without esophagitis: Secondary | ICD-10-CM | POA: Diagnosis not present

## 2019-03-03 DIAGNOSIS — E7800 Pure hypercholesterolemia, unspecified: Secondary | ICD-10-CM | POA: Diagnosis not present

## 2019-03-05 NOTE — Progress Notes (Signed)
[facility] Summary Report / Loop Recorder 

## 2019-03-20 ENCOUNTER — Telehealth: Payer: Self-pay

## 2019-03-20 NOTE — Telephone Encounter (Signed)
Spoke with patient regarding disconnected monitor 

## 2019-03-26 DIAGNOSIS — I83813 Varicose veins of bilateral lower extremities with pain: Secondary | ICD-10-CM | POA: Diagnosis not present

## 2019-03-26 DIAGNOSIS — I8311 Varicose veins of right lower extremity with inflammation: Secondary | ICD-10-CM | POA: Diagnosis not present

## 2019-03-26 DIAGNOSIS — I8312 Varicose veins of left lower extremity with inflammation: Secondary | ICD-10-CM | POA: Diagnosis not present

## 2019-03-31 ENCOUNTER — Other Ambulatory Visit: Payer: Self-pay

## 2019-03-31 ENCOUNTER — Ambulatory Visit (INDEPENDENT_AMBULATORY_CARE_PROVIDER_SITE_OTHER): Payer: Medicare Other | Admitting: *Deleted

## 2019-03-31 DIAGNOSIS — I639 Cerebral infarction, unspecified: Secondary | ICD-10-CM

## 2019-03-31 LAB — CUP PACEART REMOTE DEVICE CHECK
Date Time Interrogation Session: 20200505160709
Implantable Pulse Generator Implant Date: 20180202

## 2019-04-07 NOTE — Progress Notes (Signed)
[facility] Summary Report / Loop Recorder 

## 2019-04-08 DIAGNOSIS — I8311 Varicose veins of right lower extremity with inflammation: Secondary | ICD-10-CM | POA: Diagnosis not present

## 2019-04-22 DIAGNOSIS — E890 Postprocedural hypothyroidism: Secondary | ICD-10-CM | POA: Diagnosis not present

## 2019-04-22 DIAGNOSIS — F329 Major depressive disorder, single episode, unspecified: Secondary | ICD-10-CM | POA: Diagnosis not present

## 2019-04-22 DIAGNOSIS — F339 Major depressive disorder, recurrent, unspecified: Secondary | ICD-10-CM | POA: Diagnosis not present

## 2019-04-22 DIAGNOSIS — I639 Cerebral infarction, unspecified: Secondary | ICD-10-CM | POA: Diagnosis not present

## 2019-04-22 DIAGNOSIS — I10 Essential (primary) hypertension: Secondary | ICD-10-CM | POA: Diagnosis not present

## 2019-05-04 ENCOUNTER — Ambulatory Visit (INDEPENDENT_AMBULATORY_CARE_PROVIDER_SITE_OTHER): Payer: Medicare Other | Admitting: *Deleted

## 2019-05-04 DIAGNOSIS — R000 Tachycardia, unspecified: Secondary | ICD-10-CM

## 2019-05-04 DIAGNOSIS — I639 Cerebral infarction, unspecified: Secondary | ICD-10-CM | POA: Diagnosis not present

## 2019-05-04 LAB — CUP PACEART REMOTE DEVICE CHECK
Date Time Interrogation Session: 20200607161100
Implantable Pulse Generator Implant Date: 20180202

## 2019-05-05 DIAGNOSIS — I8311 Varicose veins of right lower extremity with inflammation: Secondary | ICD-10-CM | POA: Diagnosis not present

## 2019-05-11 NOTE — Progress Notes (Signed)
[facility] Summary Report / Loop Recorder 

## 2019-05-14 ENCOUNTER — Telehealth: Payer: Self-pay | Admitting: *Deleted

## 2019-05-14 NOTE — Telephone Encounter (Signed)
Yeah can start with hip bursa injection and we can tlk about RFA

## 2019-05-15 NOTE — Telephone Encounter (Signed)
Pt is scheduled for ov for rt bursa inj and to discuss RFA 05/27/2019

## 2019-05-20 DIAGNOSIS — I8311 Varicose veins of right lower extremity with inflammation: Secondary | ICD-10-CM | POA: Diagnosis not present

## 2019-05-27 ENCOUNTER — Ambulatory Visit (INDEPENDENT_AMBULATORY_CARE_PROVIDER_SITE_OTHER): Payer: Medicare Other | Admitting: Physical Medicine and Rehabilitation

## 2019-05-27 ENCOUNTER — Ambulatory Visit: Payer: Self-pay

## 2019-05-27 ENCOUNTER — Encounter: Payer: Self-pay | Admitting: Physical Medicine and Rehabilitation

## 2019-05-27 ENCOUNTER — Other Ambulatory Visit: Payer: Self-pay

## 2019-05-27 ENCOUNTER — Telehealth: Payer: Self-pay | Admitting: *Deleted

## 2019-05-27 VITALS — BP 129/79 | HR 69 | Ht 62.0 in | Wt 210.0 lb

## 2019-05-27 DIAGNOSIS — I639 Cerebral infarction, unspecified: Secondary | ICD-10-CM | POA: Diagnosis not present

## 2019-05-27 DIAGNOSIS — M7918 Myalgia, other site: Secondary | ICD-10-CM

## 2019-05-27 DIAGNOSIS — M545 Low back pain, unspecified: Secondary | ICD-10-CM

## 2019-05-27 DIAGNOSIS — M47816 Spondylosis without myelopathy or radiculopathy, lumbar region: Secondary | ICD-10-CM

## 2019-05-27 DIAGNOSIS — M7061 Trochanteric bursitis, right hip: Secondary | ICD-10-CM

## 2019-05-27 DIAGNOSIS — G8929 Other chronic pain: Secondary | ICD-10-CM | POA: Diagnosis not present

## 2019-05-27 NOTE — Progress Notes (Signed)
 .  Numeric Pain Rating Scale and Functional Assessment Average Pain 10 Pain Right Now 4 My pain is constant, dull and aching Pain is worse with: walking, bending and sitting Pain improves with: rest   In the last MONTH (on 0-10 scale) has pain interfered with the following?  1. General activity like being  able to carry out your everyday physical activities such as walking, climbing stairs, carrying groceries, or moving a chair?  Rating(6)  2. Relation with others like being able to carry out your usual social activities and roles such as  activities at home, at work and in your community. Rating(6)  3. Enjoyment of life such that you have  been bothered by emotional problems such as feeling anxious, depressed or irritable?  Rating(4)

## 2019-05-27 NOTE — Progress Notes (Signed)
April Armstrong - 68 y.o. female MRN [id number]  Date of birth: 12-15-1950  Office Visit Note: Visit Date: 05/27/2019 PCP: Carol Ada, MD Referred by: Carol Ada, MD  Subjective: Chief Complaint  Patient presents with   Right Hip - Pain   Lower Back - Pain   HPI: April Armstrong is a 68 y.o. female who comes in today For reevaluation and management of chronic low back pain and bilateral right more than left hip and thigh pain.  She has had a history with [place] of a combination of lumbar spondylosis and facet arthropathy and mechanical back pain as well as history of recurrent greater trochanteric bursitis.  She has no history of prior lumbar surgery.  She has had prior radiofrequency ablation of the lumbar facet joints which was performed more than a year ago with good relief up until recently.  Radiofrequency ablation was performed in 2017 at the end of the year.  We have only seen her back on a few occasions since that time really more for greater trochanteric bursitis type pain.  She reports to me today worse sitting symptoms particularly of the right hip laterally and bursa.  She has no groin pain.  No pain with hip rotation in either car.  She reports more pain walking but also some with prolonged sitting.  She gets some relief with relaxing.  She also now is reporting a lot of low back pain dull and aching worse with standing and twisting.  She feels like this is the same pain she had prior to the ablation.  She reports an average pain of 10 out of 10 but is always seem to be a little bit out of proportion with imaging and exam.  Her case is complicated by obesity history of cryptogenic stroke as well as significant anxiety on several medications.  She has not noted any focal weakness no difficulty with ambulating per se.  No fevers chills or night sweats.  She has had no red flag complaints.  No radicular pain.  No trauma.  She has had physical therapy in the past on multiple occasions  and still tries to do exercises at home.  Review of Systems  Constitutional: Negative for chills, fever, malaise/fatigue and weight loss.  HENT: Negative for hearing loss and sinus pain.   Eyes: Negative for blurred vision, double vision and photophobia.  Respiratory: Negative for cough and shortness of breath.   Cardiovascular: Negative for chest pain, palpitations and leg swelling.  Gastrointestinal: Negative for abdominal pain, nausea and vomiting.  Genitourinary: Negative for flank pain.  Musculoskeletal: Positive for back pain and joint pain. Negative for myalgias.  Skin: Negative for itching and rash.  Neurological: Negative for tingling, tremors, focal weakness and weakness.  Endo/Heme/Allergies: Negative.   Psychiatric/Behavioral: Negative for depression.  All other systems reviewed and are negative.  Otherwise per HPI.  Assessment & Plan: Visit Diagnoses:  1. Greater trochanteric bursitis, right   2. Spondylosis without myelopathy or radiculopathy, lumbar region   3. Chronic bilateral low back pain without sciatica   4. Myofascial pain syndrome     Plan: Findings:  1.  Chronic worsening severe low back pain pain seems to be out of proportion over the years with imaging findings and exam but is really limiting what she can do at times.  MRI and x-ray images have shown facet arthropathy without stenosis.  She has had no radicular complaints.  She does get pain on exam with extension and facet loading.  She has had in the past double block paradigm facet joint blocks and ultimately radiofrequency ablation.  This is documented from our prior medical record with her.  She did well with radiofrequency ablation really for over a year.  She probably wanted to come back in sooner but she is somewhat of a poor historian we do lose her to follow-up at times.  She has had no prior lumbar surgery.  She still tries to stay active.  I think without any red flag complaints the right answer is to  repeat the radiofrequency ablation will try to get that approved.  2.  Right greater trochanteric pain syndrome and bursitis.  I think this is a combination of true bursitis along with probably myofascial pain syndrome and may be even some central pain sensitization syndrome such as a fibromyalgia although she has never been diagnosed with that.  She does have a lot of anxiety issues and is treated with that and treated with multiple medications.  We do tend to see this from a standpoint of pain complaints that are very superficial with light palpation.  She is gotten relief in the past with bursa injections and I think given the amount of pain she is having we will repeat that today using fluoroscopic guidance to do her body habitus.  We have also discussed the use of a foam roller with her and how to obtain that.    Meds & Orders: No orders of the defined types were placed in this encounter.   Orders Placed This Encounter  Procedures   Large Joint Inj: R greater trochanter   XR C-ARM NO REPORT    Follow-up: Return for Radiofrequency ablation once approval is done..   Procedures: Large Joint Inj: R greater trochanter on 05/27/2019 9:59 AM Indications: pain and diagnostic evaluation Details: 22 G 3.5 in needle, fluoroscopy-guided lateral approach  Arthrogram: No  Medications: 4 mL lidocaine 2 %; 80 mg triamcinolone acetonide 40 MG/ML; 4 mL bupivacaine 0.25 % Outcome: tolerated well, no immediate complications  There was excellent flow of contrast outlined the greater trochanteric bursa without vascular uptake. Procedure, treatment alternatives, risks and benefits explained, specific risks discussed. Consent was given by the patient. Immediately prior to procedure a time out was called to verify the correct patient, procedure, equipment, support staff and site/side marked as required. Patient was prepped and draped in the usual sterile fashion.      No notes on file   Clinical  History: MRI LUMBAR SPINE WITHOUT CONTRAST  TECHNIQUE: Multiplanar, multisequence MR imaging of the lumbar spine was performed. No intravenous contrast was administered.  COMPARISON:  None.  FINDINGS: Vertebral body height is maintained. Trace anterolisthesis L4 on L5 due to facet arthropathy is noted. The conus medullaris is normal in signal and position. Imaged intra-abdominal contents are unremarkable.  T11-12 and T12-L1 are imaged in the sagittal plane only. T12-L1 is unremarkable. There is a shallow disc bulge at T11-12 but the central canal and foramina are open.  L1-2:  Negative.  L2-3:  Mild facet degenerative change.  Otherwise negative.  L3-4:  Mild facet degenerative change.  Otherwise negative.  L4-5: Moderately severe facet degenerative change is present. The disc is uncovered without bulging. The central canal is mildly narrowed. Mild to moderate foraminal narrowing is more notable on the left.  L5-S1: There is some facet degenerative change. No disc bulge or protrusion. The central canal and foramina are widely patent.  IMPRESSION: Mild spondylosis most notable at L4-5 where facet  degenerative change results in trace anterolisthesis and there is mild to moderate foraminal narrowing, more notable on the left. The central canal is only mildly narrowed at this level. No nerve root compression is identified.   Electronically Signed   By: Inge Rise M.D.   On: 02/07/2016 13:03   She reports that she has never smoked. Her smokeless tobacco use includes chew. No results for input(s): HGBA1C, LABURIC in the last 8760 hours.  Objective:  VS:  HT:5\' 2"  (157.5 cm)    WT:210 lb (95.3 kg)   BMI:38.4     BP:129/79   HR:69bpm   TEMP: ( )   RESP:  Physical Exam Vitals signs and nursing note reviewed.  Constitutional:      General: She is not in acute distress.    Appearance: Normal appearance. She is well-developed. She is obese.  HENT:     Head:  Normocephalic and atraumatic.     Nose: Nose normal.     Mouth/Throat:     Mouth: Mucous membranes are moist.     Pharynx: Oropharynx is clear.  Eyes:     Conjunctiva/sclera: Conjunctivae normal.     Pupils: Pupils are equal, round, and reactive to light.  Neck:     Musculoskeletal: Normal range of motion and neck supple.  Cardiovascular:     Rate and Rhythm: Regular rhythm.  Pulmonary:     Effort: Pulmonary effort is normal. No respiratory distress.  Abdominal:     General: There is no distension.     Palpations: Abdomen is soft.     Tenderness: There is no guarding.  Musculoskeletal:     Right lower leg: No edema.     Left lower leg: No edema.     Comments: Patient is slow to rise from a seated position to full extension and has pain with facet loading and extension rotation.  She is tender over the right more than left greater trochanter and is fairly exquisite although it is somewhat with light palpation given her body habitus.  No pain with hip rotation she has good distal strength without clonus.  No focal weakness.  Skin:    General: Skin is warm and dry.     Findings: No erythema or rash.  Neurological:     General: No focal deficit present.     Mental Status: She is alert and oriented to person, place, and time.     Motor: No abnormal muscle tone.     Coordination: Coordination normal.     Gait: Gait normal.  Psychiatric:        Mood and Affect: Mood normal.        Behavior: Behavior normal.        Thought Content: Thought content normal.     Ortho Exam Imaging: Xr C-arm No Report  Result Date: 05/27/2019 Please see Notes tab for imaging impression.   Past Medical/Family/Surgical/Social History: Medications & Allergies reviewed per EMR, new medications updated. Patient Active Problem List   Diagnosis Date Noted   Trigger finger, left ring finger 03/10/2018   BPPV (benign paroxysmal positional vertigo) 10/22/2016   Anxiety state 06/22/2016   Tachycardia  04/25/2016   Bilateral occipital neuralgia    Cryptogenic stroke ([place]) 04/13/2016   Cerebrovascular accident (CVA) due to thrombosis of right middle cerebral artery ([place]) 04/13/2016   Cerebral infarction [facility])    Thyroid activity decreased    Depression    Hypothyroidism    GERD (gastroesophageal reflux disease)    Hyperlipidemia  Hypertension    IBS (irritable bowel syndrome)    Past Medical History:  Diagnosis Date   Depression    GERD (gastroesophageal reflux disease)    Hypercholesterolemia    Hypertension    Hypothyroidism    IBS (irritable bowel syndrome)    Stroke ([place])    Thyroid disease    Family History  Problem Relation Age of Onset   Dementia Mother    Hypercholesterolemia Mother    Heart disease Father    Stroke Father    Heart disease Brother    Congestive Heart Failure Sister    Breast cancer Paternal Aunt    Past Surgical History:  Procedure Laterality Date   ABDOMINAL HYSTERECTOMY     APPENDECTOMY     CESAREAN SECTION     CHOLECYSTECTOMY     EP IMPLANTABLE DEVICE N/A 12/28/2016   Procedure: Loop Recorder Insertion;  Surgeon: Evans Lance, MD;  Location: [place] [facility];  Service: Cardiovascular;  Laterality: N/A;   THYROIDECTOMY     TRIGGER FINGER RELEASE     Social History   Occupational History   Not on file  Tobacco Use   Smoking status: Never Smoker   Smokeless tobacco: Current User    Types: Chew  Substance and Sexual Activity   Alcohol use: No   Drug use: No   Sexual activity: Not on file

## 2019-05-28 ENCOUNTER — Encounter: Payer: Self-pay | Admitting: Physical Medicine and Rehabilitation

## 2019-05-28 MED ORDER — BUPIVACAINE HCL 0.25 % IJ SOLN
4.0000 mL | INTRAMUSCULAR | Status: AC | PRN
  Administered 2019-05-27: 4 mL via INTRA_ARTICULAR

## 2019-05-28 MED ORDER — TRIAMCINOLONE ACETONIDE 40 MG/ML IJ SUSP
80.0000 mg | INTRAMUSCULAR | Status: AC | PRN
  Administered 2019-05-27: 80 mg via INTRA_ARTICULAR

## 2019-05-28 MED ORDER — LIDOCAINE HCL 2 % IJ SOLN
4.0000 mL | INTRAMUSCULAR | Status: AC | PRN
  Administered 2019-05-27: 4 mL

## 2019-05-28 NOTE — Telephone Encounter (Signed)
Done

## 2019-06-03 DIAGNOSIS — I83812 Varicose veins of left lower extremities with pain: Secondary | ICD-10-CM | POA: Diagnosis not present

## 2019-06-03 DIAGNOSIS — I8312 Varicose veins of left lower extremity with inflammation: Secondary | ICD-10-CM | POA: Diagnosis not present

## 2019-06-05 ENCOUNTER — Ambulatory Visit (INDEPENDENT_AMBULATORY_CARE_PROVIDER_SITE_OTHER): Payer: Medicare Other | Admitting: *Deleted

## 2019-06-05 DIAGNOSIS — I639 Cerebral infarction, unspecified: Secondary | ICD-10-CM | POA: Diagnosis not present

## 2019-06-05 LAB — CUP PACEART REMOTE DEVICE CHECK
Date Time Interrogation Session: 20200710163629
Implantable Pulse Generator Implant Date: 20180202

## 2019-06-08 ENCOUNTER — Telehealth: Payer: Self-pay

## 2019-06-08 NOTE — Telephone Encounter (Signed)
Spoke with patient regarding disconnected monitor 

## 2019-06-09 NOTE — Progress Notes (Signed)
[facility] Summary Report / Loop Recorder 

## 2019-06-17 ENCOUNTER — Other Ambulatory Visit: Payer: Self-pay

## 2019-06-17 ENCOUNTER — Ambulatory Visit (INDEPENDENT_AMBULATORY_CARE_PROVIDER_SITE_OTHER): Payer: Medicare Other | Admitting: Physical Medicine and Rehabilitation

## 2019-06-17 ENCOUNTER — Encounter: Payer: Self-pay | Admitting: Physical Medicine and Rehabilitation

## 2019-06-17 ENCOUNTER — Ambulatory Visit: Payer: Self-pay

## 2019-06-17 VITALS — BP 124/70 | HR 66

## 2019-06-17 DIAGNOSIS — M47816 Spondylosis without myelopathy or radiculopathy, lumbar region: Secondary | ICD-10-CM | POA: Diagnosis not present

## 2019-06-17 MED ORDER — METHYLPREDNISOLONE ACETATE 80 MG/ML IJ SUSP
80.0000 mg | Freq: Once | INTRAMUSCULAR | Status: AC
  Administered 2019-06-17: 80 mg

## 2019-06-17 NOTE — Progress Notes (Signed)
   Numeric Pain Rating Scale and Functional Assessment Average Pain 10   In the last MONTH (on 0-10 scale) has pain interfered with the following?  1. General activity like being  able to carry out your everyday physical activities such as walking, climbing stairs, carrying groceries, or moving a chair?  Rating(8)   +Driver, -BT, -Dye Allergies.  

## 2019-06-18 DIAGNOSIS — E890 Postprocedural hypothyroidism: Secondary | ICD-10-CM | POA: Diagnosis not present

## 2019-06-18 DIAGNOSIS — I639 Cerebral infarction, unspecified: Secondary | ICD-10-CM | POA: Diagnosis not present

## 2019-06-18 DIAGNOSIS — F339 Major depressive disorder, recurrent, unspecified: Secondary | ICD-10-CM | POA: Diagnosis not present

## 2019-06-18 DIAGNOSIS — I10 Essential (primary) hypertension: Secondary | ICD-10-CM | POA: Diagnosis not present

## 2019-06-18 DIAGNOSIS — F329 Major depressive disorder, single episode, unspecified: Secondary | ICD-10-CM | POA: Diagnosis not present

## 2019-06-23 NOTE — Progress Notes (Signed)
April Armstrong - 68 y.o. female MRN [id number]  Date of birth: 09/17/1951  Office Visit Note: Visit Date: 06/17/2019 PCP: Carol Ada, MD Referred by: Carol Ada, MD  Subjective: Chief Complaint  Patient presents with  . Lower Back - Pain   HPI:  April Armstrong is a 68 y.o. female who comes in today For planned repeat L4-5 facet joint radiofrequency ablation.  Please see our prior note for further details and justification.  This is a repeat procedure that did help in the past.  Prior to that procedure she had the full gamut of double diagnostic blocks and physical therapy and medication management without relief.  She has since gone on to have more recent injection of the greater trochanteric bursa.  She continues with some therapy and medication treatment.  Her symptoms are axial low back without leg pain.  ROS Otherwise per HPI.  Assessment & Plan: Visit Diagnoses:  1. Spondylosis without myelopathy or radiculopathy, lumbar region     Plan: No additional findings.   Meds & Orders:  Meds ordered this encounter  Medications  . methylPREDNISolone acetate (DEPO-MEDROL) injection 80 mg    Orders Placed This Encounter  Procedures  . Radiofrequency,Lumbar  . XR C-ARM NO REPORT    Follow-up: No follow-ups on file.   Procedures: No procedures performed  Lumbar Facet Joint Nerve Denervation  Patient: April Armstrong      Date of Birth: August 23, 1951 MRN: [id number] PCP: Carol Ada, MD      Visit Date: 06/17/2019   Universal Protocol:    Date/Time: 07/28/206:14 AM  Consent Given By: the patient  Position: PRONE  Additional Comments: Vital signs were monitored before and after the procedure. Patient was prepped and draped in the usual sterile fashion. The correct patient, procedure, and site was verified.   Injection Procedure Details:  Procedure Site One Meds Administered:  Meds ordered this encounter  Medications  . methylPREDNISolone acetate  (DEPO-MEDROL) injection 80 mg     Laterality: Right  Location/Site:  L4-L5  Needle size: 18 G  Needle type: Radiofrequency cannula  Needle Placement: Along juncture of superior articular process and transverse pocess  Findings:  -Comments:  Procedure Details: For each desired target nerve, the corresponding transverse process (sacral ala for the L5 dorsal rami) was identified and the fluoroscope was positioned to square off the endplates of the corresponding vertebral body to achieve a true AP midline view.  The beam was then obliqued 15 to 20 degrees and caudally tilted 15 to 20 degrees to line up a trajectory along the target nerves. The skin over the target of the junction of superior articulating process and transverse process (sacral ala for the L5 dorsal rami) was infiltrated with 75ml of 1% Lidocaine without Epinephrine.  The 18 gauge 61mm active tip outer cannula was advanced in trajectory view to the target.  This procedure was repeated for each target nerve.  Then, for all levels, the outer cannula placement was fine-tuned and the position was then confirmed with bi-planar imaging.    Test stimulation was done both at sensory and motor levels to ensure there was no radicular stimulation. The target tissues were then infiltrated with 1 ml of 1% Lidocaine without Epinephrine. Subsequently, a percutaneous neurotomy was carried out for 90 seconds at 80 degrees Celsius.  After the completion of the lesion, 1 ml of injectate was delivered. It was then repeated for each facet joint nerve mentioned above. Appropriate radiographs were obtained to verify the probe placement during  the neurotomy.   Additional Comments:  The patient tolerated the procedure well Dressing: 2 x 2 sterile gauze and Band-Aid    Post-procedure details: Patient was observed during the procedure. Post-procedure instructions were reviewed.  Patient left the clinic in stable condition.      Clinical  History: MRI LUMBAR SPINE WITHOUT CONTRAST  TECHNIQUE: Multiplanar, multisequence MR imaging of the lumbar spine was performed. No intravenous contrast was administered.  COMPARISON:  None.  FINDINGS: Vertebral body height is maintained. Trace anterolisthesis L4 on L5 due to facet arthropathy is noted. The conus medullaris is normal in signal and position. Imaged intra-abdominal contents are unremarkable.  T11-12 and T12-L1 are imaged in the sagittal plane only. T12-L1 is unremarkable. There is a shallow disc bulge at T11-12 but the central canal and foramina are open.  L1-2:  Negative.  L2-3:  Mild facet degenerative change.  Otherwise negative.  L3-4:  Mild facet degenerative change.  Otherwise negative.  L4-5: Moderately severe facet degenerative change is present. The disc is uncovered without bulging. The central canal is mildly narrowed. Mild to moderate foraminal narrowing is more notable on the left.  L5-S1: There is some facet degenerative change. No disc bulge or protrusion. The central canal and foramina are widely patent.  IMPRESSION: Mild spondylosis most notable at L4-5 where facet degenerative change results in trace anterolisthesis and there is mild to moderate foraminal narrowing, more notable on the left. The central canal is only mildly narrowed at this level. No nerve root compression is identified.   Electronically Signed   By: Inge Rise M.D.   On: 02/07/2016 13:03     Objective:  VS:  HT:    WT:   BMI:     BP:124/70  HR:66bpm  TEMP: ( )  RESP:  Physical Exam  Ortho Exam Imaging: No results found.

## 2019-06-23 NOTE — Procedures (Signed)
Lumbar Facet Joint Nerve Denervation  Patient: April Armstrong      Date of Birth: 12/01/1950 MRN: [id number] PCP: Carol Ada, MD      Visit Date: 06/17/2019   Universal Protocol:    Date/Time: 07/28/206:14 AM  Consent Given By: the patient  Position: PRONE  Additional Comments: Vital signs were monitored before and after the procedure. Patient was prepped and draped in the usual sterile fashion. The correct patient, procedure, and site was verified.   Injection Procedure Details:  Procedure Site One Meds Administered:  Meds ordered this encounter  Medications  . methylPREDNISolone acetate (DEPO-MEDROL) injection 80 mg     Laterality: Right  Location/Site:  L4-L5  Needle size: 18 G  Needle type: Radiofrequency cannula  Needle Placement: Along juncture of superior articular process and transverse pocess  Findings:  -Comments:  Procedure Details: For each desired target nerve, the corresponding transverse process (sacral ala for the L5 dorsal rami) was identified and the fluoroscope was positioned to square off the endplates of the corresponding vertebral body to achieve a true AP midline view.  The beam was then obliqued 15 to 20 degrees and caudally tilted 15 to 20 degrees to line up a trajectory along the target nerves. The skin over the target of the junction of superior articulating process and transverse process (sacral ala for the L5 dorsal rami) was infiltrated with 15ml of 1% Lidocaine without Epinephrine.  The 18 gauge 48mm active tip outer cannula was advanced in trajectory view to the target.  This procedure was repeated for each target nerve.  Then, for all levels, the outer cannula placement was fine-tuned and the position was then confirmed with bi-planar imaging.    Test stimulation was done both at sensory and motor levels to ensure there was no radicular stimulation. The target tissues were then infiltrated with 1 ml of 1% Lidocaine without  Epinephrine. Subsequently, a percutaneous neurotomy was carried out for 90 seconds at 80 degrees Celsius.  After the completion of the lesion, 1 ml of injectate was delivered. It was then repeated for each facet joint nerve mentioned above. Appropriate radiographs were obtained to verify the probe placement during the neurotomy.   Additional Comments:  The patient tolerated the procedure well Dressing: 2 x 2 sterile gauze and Band-Aid    Post-procedure details: Patient was observed during the procedure. Post-procedure instructions were reviewed.  Patient left the clinic in stable condition.

## 2019-06-24 ENCOUNTER — Encounter: Payer: Self-pay | Admitting: Physical Medicine and Rehabilitation

## 2019-06-24 ENCOUNTER — Ambulatory Visit: Payer: Self-pay

## 2019-06-24 ENCOUNTER — Ambulatory Visit (INDEPENDENT_AMBULATORY_CARE_PROVIDER_SITE_OTHER): Payer: Medicare Other | Admitting: Physical Medicine and Rehabilitation

## 2019-06-24 VITALS — BP 111/56 | HR 52

## 2019-06-24 DIAGNOSIS — M47816 Spondylosis without myelopathy or radiculopathy, lumbar region: Secondary | ICD-10-CM | POA: Diagnosis not present

## 2019-06-24 MED ORDER — METHYLPREDNISOLONE ACETATE 80 MG/ML IJ SUSP
80.0000 mg | Freq: Once | INTRAMUSCULAR | Status: AC
  Administered 2019-06-24: 80 mg

## 2019-06-24 NOTE — Progress Notes (Signed)
 .  Numeric Pain Rating Scale and Functional Assessment Average Pain 6   In the last MONTH (on 0-10 scale) has pain interfered with the following?  1. General activity like being  able to carry out your everyday physical activities such as walking, climbing stairs, carrying groceries, or moving a chair?  Rating(4)   +Driver, -BT, -Dye Allergies.  

## 2019-06-25 NOTE — Progress Notes (Signed)
April Armstrong - 68 y.o. female MRN [id number]  Date of birth: Jan 03, 1951  Office Visit Note: Visit Date: 06/24/2019 PCP: Carol Ada, MD Referred by: Carol Ada, MD  Subjective: No chief complaint on file.  HPI:  April Armstrong is a 68 y.o. female who comes in today For planned left-sided facet joint ablation at L4-5.  This is a repeat facet joint radiofrequency ablation.  Prior ablation a few years ago was very successful.  She is really been lost to follow-up to some degree after that with occasional injection for chronic bursitis.  She comes in today after having had the right side completed a few weeks ago and she actually is doing well on the right.  We will complete the left side today.  I will see her back in 4 weeks or so as follow-up she can cancel that if needed.  We have talked about weight loss and continued core strengthening.  Please see our prior notes for further detail justification.  ROS Otherwise per HPI.  Assessment & Plan: Visit Diagnoses:  1. Spondylosis without myelopathy or radiculopathy, lumbar region     Plan: No additional findings.   Meds & Orders:  Meds ordered this encounter  Medications  . methylPREDNISolone acetate (DEPO-MEDROL) injection 80 mg    Orders Placed This Encounter  Procedures  . Radiofrequency,Lumbar  . XR C-ARM NO REPORT    Follow-up: Return if symptoms worsen or fail to improve.   Procedures: No procedures performed  Lumbar Facet Joint Nerve Denervation  Patient: April Armstrong      Date of Birth: 15-Oct-1951 MRN: [id number] PCP: Carol Ada, MD      Visit Date: 06/24/2019   Universal Protocol:    Date/Time: 07/30/206:27 AM  Consent Given By: the patient  Position: PRONE  Additional Comments: Vital signs were monitored before and after the procedure. Patient was prepped and draped in the usual sterile fashion. The correct patient, procedure, and site was verified.   Injection Procedure Details:   Procedure Site One Meds Administered:  Meds ordered this encounter  Medications  . methylPREDNISolone acetate (DEPO-MEDROL) injection 80 mg     Laterality: Left  Location/Site:  L4-L5  Needle size: 18 G  Needle type: Radiofrequency cannula  Needle Placement: Along juncture of superior articular process and transverse pocess  Findings:  -Comments:  Procedure Details: For each desired target nerve, the corresponding transverse process (sacral ala for the L5 dorsal rami) was identified and the fluoroscope was positioned to square off the endplates of the corresponding vertebral body to achieve a true AP midline view.  The beam was then obliqued 15 to 20 degrees and caudally tilted 15 to 20 degrees to line up a trajectory along the target nerves. The skin over the target of the junction of superior articulating process and transverse process (sacral ala for the L5 dorsal rami) was infiltrated with 13ml of 1% Lidocaine without Epinephrine.  The 18 gauge 41mm active tip outer cannula was advanced in trajectory view to the target.  This procedure was repeated for each target nerve.  Then, for all levels, the outer cannula placement was fine-tuned and the position was then confirmed with bi-planar imaging.    Test stimulation was done both at sensory and motor levels to ensure there was no radicular stimulation. The target tissues were then infiltrated with 1 ml of 1% Lidocaine without Epinephrine. Subsequently, a percutaneous neurotomy was carried out for 90 seconds at 80 degrees Celsius.  After the completion of the lesion,  1 ml of injectate was delivered. It was then repeated for each facet joint nerve mentioned above. Appropriate radiographs were obtained to verify the probe placement during the neurotomy.   Additional Comments:  The patient tolerated the procedure well Dressing: 2 x 2 sterile gauze and Band-Aid    Post-procedure details: Patient was observed during the procedure.  Post-procedure instructions were reviewed.  Patient left the clinic in stable condition.      Clinical History: MRI LUMBAR SPINE WITHOUT CONTRAST  TECHNIQUE: Multiplanar, multisequence MR imaging of the lumbar spine was performed. No intravenous contrast was administered.  COMPARISON:  None.  FINDINGS: Vertebral body height is maintained. Trace anterolisthesis L4 on L5 due to facet arthropathy is noted. The conus medullaris is normal in signal and position. Imaged intra-abdominal contents are unremarkable.  T11-12 and T12-L1 are imaged in the sagittal plane only. T12-L1 is unremarkable. There is a shallow disc bulge at T11-12 but the central canal and foramina are open.  L1-2:  Negative.  L2-3:  Mild facet degenerative change.  Otherwise negative.  L3-4:  Mild facet degenerative change.  Otherwise negative.  L4-5: Moderately severe facet degenerative change is present. The disc is uncovered without bulging. The central canal is mildly narrowed. Mild to moderate foraminal narrowing is more notable on the left.  L5-S1: There is some facet degenerative change. No disc bulge or protrusion. The central canal and foramina are widely patent.  IMPRESSION: Mild spondylosis most notable at L4-5 where facet degenerative change results in trace anterolisthesis and there is mild to moderate foraminal narrowing, more notable on the left. The central canal is only mildly narrowed at this level. No nerve root compression is identified.   Electronically Signed   By: Inge Rise M.D.   On: 02/07/2016 13:03     Objective:  VS:  HT:    WT:   BMI:     BP:(!) 111/56  HR:(!) 52bpm  TEMP: ( )  RESP:  Physical Exam  Ortho Exam Imaging: Xr C-arm No Report  Result Date: 06/24/2019 Please see Notes tab for imaging impression.

## 2019-06-25 NOTE — Procedures (Signed)
Lumbar Facet Joint Nerve Denervation  Patient: April Armstrong      Date of Birth: 21-Oct-1951 MRN: [id number] PCP: Carol Ada, MD      Visit Date: 06/24/2019   Universal Protocol:    Date/Time: 07/30/206:27 AM  Consent Given By: the patient  Position: PRONE  Additional Comments: Vital signs were monitored before and after the procedure. Patient was prepped and draped in the usual sterile fashion. The correct patient, procedure, and site was verified.   Injection Procedure Details:  Procedure Site One Meds Administered:  Meds ordered this encounter  Medications  . methylPREDNISolone acetate (DEPO-MEDROL) injection 80 mg     Laterality: Left  Location/Site:  L4-L5  Needle size: 18 G  Needle type: Radiofrequency cannula  Needle Placement: Along juncture of superior articular process and transverse pocess  Findings:  -Comments:  Procedure Details: For each desired target nerve, the corresponding transverse process (sacral ala for the L5 dorsal rami) was identified and the fluoroscope was positioned to square off the endplates of the corresponding vertebral body to achieve a true AP midline view.  The beam was then obliqued 15 to 20 degrees and caudally tilted 15 to 20 degrees to line up a trajectory along the target nerves. The skin over the target of the junction of superior articulating process and transverse process (sacral ala for the L5 dorsal rami) was infiltrated with 1ml of 1% Lidocaine without Epinephrine.  The 18 gauge 72mm active tip outer cannula was advanced in trajectory view to the target.  This procedure was repeated for each target nerve.  Then, for all levels, the outer cannula placement was fine-tuned and the position was then confirmed with bi-planar imaging.    Test stimulation was done both at sensory and motor levels to ensure there was no radicular stimulation. The target tissues were then infiltrated with 1 ml of 1% Lidocaine without Epinephrine.  Subsequently, a percutaneous neurotomy was carried out for 90 seconds at 80 degrees Celsius.  After the completion of the lesion, 1 ml of injectate was delivered. It was then repeated for each facet joint nerve mentioned above. Appropriate radiographs were obtained to verify the probe placement during the neurotomy.   Additional Comments:  The patient tolerated the procedure well Dressing: 2 x 2 sterile gauze and Band-Aid    Post-procedure details: Patient was observed during the procedure. Post-procedure instructions were reviewed.  Patient left the clinic in stable condition.

## 2019-07-02 ENCOUNTER — Other Ambulatory Visit: Payer: Self-pay | Admitting: Family Medicine

## 2019-07-02 DIAGNOSIS — Z1231 Encounter for screening mammogram for malignant neoplasm of breast: Secondary | ICD-10-CM

## 2019-07-08 ENCOUNTER — Ambulatory Visit (INDEPENDENT_AMBULATORY_CARE_PROVIDER_SITE_OTHER): Payer: Medicare Other | Admitting: *Deleted

## 2019-07-08 DIAGNOSIS — I639 Cerebral infarction, unspecified: Secondary | ICD-10-CM

## 2019-07-09 LAB — CUP PACEART REMOTE DEVICE CHECK
Date Time Interrogation Session: 20200812173623
Implantable Pulse Generator Implant Date: 20180202

## 2019-07-10 ENCOUNTER — Other Ambulatory Visit: Payer: Self-pay

## 2019-07-10 ENCOUNTER — Ambulatory Visit
Admission: RE | Admit: 2019-07-10 | Discharge: 2019-07-10 | Disposition: A | Discharge status: Home / Self Care | Payer: Medicare Other | Source: Ambulatory Visit | Attending: Family Medicine | Admitting: Family Medicine

## 2019-07-10 DIAGNOSIS — Z1231 Encounter for screening mammogram for malignant neoplasm of breast: Secondary | ICD-10-CM

## 2019-07-15 DIAGNOSIS — E890 Postprocedural hypothyroidism: Secondary | ICD-10-CM | POA: Diagnosis not present

## 2019-07-15 DIAGNOSIS — I10 Essential (primary) hypertension: Secondary | ICD-10-CM | POA: Diagnosis not present

## 2019-07-15 DIAGNOSIS — F329 Major depressive disorder, single episode, unspecified: Secondary | ICD-10-CM | POA: Diagnosis not present

## 2019-07-15 DIAGNOSIS — F339 Major depressive disorder, recurrent, unspecified: Secondary | ICD-10-CM | POA: Diagnosis not present

## 2019-07-15 DIAGNOSIS — I639 Cerebral infarction, unspecified: Secondary | ICD-10-CM | POA: Diagnosis not present

## 2019-07-17 NOTE — Progress Notes (Signed)
[facility] Summary Report / Loop Recorder 

## 2019-07-22 ENCOUNTER — Ambulatory Visit (INDEPENDENT_AMBULATORY_CARE_PROVIDER_SITE_OTHER): Payer: Medicare Other | Admitting: Physical Medicine and Rehabilitation

## 2019-07-22 ENCOUNTER — Encounter: Payer: Self-pay | Admitting: Physical Medicine and Rehabilitation

## 2019-07-22 VITALS — BP 124/61 | HR 63

## 2019-07-22 DIAGNOSIS — G894 Chronic pain syndrome: Secondary | ICD-10-CM

## 2019-07-22 DIAGNOSIS — M7918 Myalgia, other site: Secondary | ICD-10-CM | POA: Diagnosis not present

## 2019-07-22 DIAGNOSIS — G8929 Other chronic pain: Secondary | ICD-10-CM

## 2019-07-22 DIAGNOSIS — M545 Low back pain, unspecified: Secondary | ICD-10-CM

## 2019-07-22 DIAGNOSIS — M47816 Spondylosis without myelopathy or radiculopathy, lumbar region: Secondary | ICD-10-CM | POA: Diagnosis not present

## 2019-07-22 DIAGNOSIS — I639 Cerebral infarction, unspecified: Secondary | ICD-10-CM | POA: Diagnosis not present

## 2019-07-22 NOTE — Progress Notes (Signed)
.  Numeric Pain Rating Scale and Functional Assessment Average Pain 10   In the last MONTH (on 0-10 scale) has pain interfered with the following?  1. General activity like being  able to carry out your everyday physical activities such as walking, climbing stairs, carrying groceries, or moving a chair?  Rating(10)     

## 2019-08-05 DIAGNOSIS — H2511 Age-related nuclear cataract, right eye: Secondary | ICD-10-CM | POA: Diagnosis not present

## 2019-08-05 DIAGNOSIS — H2513 Age-related nuclear cataract, bilateral: Secondary | ICD-10-CM | POA: Diagnosis not present

## 2019-08-10 ENCOUNTER — Ambulatory Visit (INDEPENDENT_AMBULATORY_CARE_PROVIDER_SITE_OTHER): Payer: Medicare Other | Admitting: *Deleted

## 2019-08-10 DIAGNOSIS — I639 Cerebral infarction, unspecified: Secondary | ICD-10-CM | POA: Diagnosis not present

## 2019-08-11 LAB — CUP PACEART REMOTE DEVICE CHECK
Date Time Interrogation Session: 20200914173602
Implantable Pulse Generator Implant Date: 20180202

## 2019-08-12 ENCOUNTER — Telehealth: Payer: Self-pay | Admitting: Radiology

## 2019-08-12 DIAGNOSIS — E890 Postprocedural hypothyroidism: Secondary | ICD-10-CM | POA: Diagnosis not present

## 2019-08-12 DIAGNOSIS — F329 Major depressive disorder, single episode, unspecified: Secondary | ICD-10-CM | POA: Diagnosis not present

## 2019-08-12 DIAGNOSIS — I639 Cerebral infarction, unspecified: Secondary | ICD-10-CM | POA: Diagnosis not present

## 2019-08-12 DIAGNOSIS — F339 Major depressive disorder, recurrent, unspecified: Secondary | ICD-10-CM | POA: Diagnosis not present

## 2019-08-12 DIAGNOSIS — I10 Essential (primary) hypertension: Secondary | ICD-10-CM | POA: Diagnosis not present

## 2019-08-12 NOTE — Telephone Encounter (Signed)
Patient called states that she needs a follo/w up on her back.  Please advise--looks like she was seen on 07/22/2019---

## 2019-08-13 NOTE — Telephone Encounter (Signed)
Ov, ok, nothing really to do but we can eval.

## 2019-08-13 NOTE — Telephone Encounter (Signed)
sched for 09/02/2019

## 2019-08-21 NOTE — Progress Notes (Signed)
[facility] Summary Report / Loop Recorder 

## 2019-08-25 DIAGNOSIS — H25811 Combined forms of age-related cataract, right eye: Secondary | ICD-10-CM | POA: Diagnosis not present

## 2019-08-25 DIAGNOSIS — H2511 Age-related nuclear cataract, right eye: Secondary | ICD-10-CM | POA: Diagnosis not present

## 2019-08-26 DIAGNOSIS — H2512 Age-related nuclear cataract, left eye: Secondary | ICD-10-CM | POA: Diagnosis not present

## 2019-08-26 DIAGNOSIS — H25042 Posterior subcapsular polar age-related cataract, left eye: Secondary | ICD-10-CM | POA: Diagnosis not present

## 2019-08-26 DIAGNOSIS — H25012 Cortical age-related cataract, left eye: Secondary | ICD-10-CM | POA: Diagnosis not present

## 2019-08-27 ENCOUNTER — Ambulatory Visit: Payer: Medicare Other | Admitting: Physical Medicine and Rehabilitation

## 2019-09-01 DIAGNOSIS — H25812 Combined forms of age-related cataract, left eye: Secondary | ICD-10-CM | POA: Diagnosis not present

## 2019-09-01 DIAGNOSIS — H2512 Age-related nuclear cataract, left eye: Secondary | ICD-10-CM | POA: Diagnosis not present

## 2019-09-02 ENCOUNTER — Ambulatory Visit: Payer: Medicare Other | Admitting: Physical Medicine and Rehabilitation

## 2019-09-02 ENCOUNTER — Encounter: Payer: Self-pay | Admitting: Physical Medicine and Rehabilitation

## 2019-09-02 DIAGNOSIS — H25812 Combined forms of age-related cataract, left eye: Secondary | ICD-10-CM | POA: Diagnosis not present

## 2019-09-02 NOTE — Progress Notes (Signed)
April Armstrong - 68 y.o. female MRN [id number]  Date of birth: 1951-02-11  Office Visit Note: Visit Date: 07/22/2019 PCP: Carol Ada, MD Referred by: Carol Ada, MD  Subjective: Chief Complaint  Patient presents with  . Lower Back - Pain   HPI: April Armstrong is a 68 y.o. female who comes in today For follow-up evaluation of chronic axial low back pain.  She is about a month status post radiofrequency ablation of the L4-5 facet joints bilaterally.  MRI from 2017 showing fairly severe facet joint arthritis with very small listhesis without any stenosis.  She reports the procedure helped some but she is still a lot of pain mostly on the right side.  The pain is still better but still hurts.  Interestingly she appears fairly comfortable sitting and chatting today but she rates her pain as a 10 out of 10.  She says it is better with sitting than standing.  Images were reviewed show well-placed cannulas for the ablation.  She has had no radicular plaints down the legs.  Pain is low back on the right side really above the SI joint but close to the upper part of the sacroiliac joint.  She has had no prior lumbar surgery.  She has had physical therapy and does continue with exercises.  She unfortunately is morbidly obese.  She finds herself not exercising or doing much because of the pain.  She continues to take meloxicam.  She does have a history of depression she takes a considerable amount of trazodone as well as sertraline and Wellbutrin.  Review of Systems  Constitutional: Negative for chills, fever, malaise/fatigue and weight loss.  HENT: Negative for hearing loss and sinus pain.   Eyes: Negative for blurred vision, double vision and photophobia.  Respiratory: Negative for cough and shortness of breath.   Cardiovascular: Negative for chest pain, palpitations and leg swelling.  Gastrointestinal: Negative for abdominal pain, nausea and vomiting.  Genitourinary:  Negative for flank pain.  Musculoskeletal: Positive for back pain. Negative for myalgias.  Skin: Negative for itching and rash.  Neurological: Negative for tremors, focal weakness and weakness.  Endo/Heme/Allergies: Negative.   Psychiatric/Behavioral: Negative for depression.  All other systems reviewed and are negative.  Otherwise per HPI.  Assessment & Plan: Visit Diagnoses:  1. Chronic bilateral low back pain without sciatica   2. Spondylosis without myelopathy or radiculopathy, lumbar region   3. Myofascial pain syndrome   4. 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  Has a unwell in the past with injection temporarily but everything seems only last about a month or so.  Radiofrequency ablation performed did not really did not give her much relief and they look well-placed.  We are still only a month out I will some hope that maybe over the next couple of weeks we might even see some relief from the ablation procedure.  We do see that it does take around 4 weeks and some people to get the nerve to completely degenerate.  We have talked about core strengthening today and stretching.  I given her some exercises including the "dead bug "exercise.  I think it would be good obviously if she could lose weight but there is this issue with having a hard time exercising.  We did talk about a lot of this being diet related and I would  encourage her to continue with that idea of weight loss.  And activity.  Consideration could be given to potential for medication such as Lyrica or Cymbalta etc.  Little bit difficult she is already on some medications for depression.  MRI was from 2017 could look at updating MRI depending on where this goes.    Meds & Orders: No orders of the defined types were placed in this encounter.  No orders of the defined types were placed in this encounter.    Follow-up: No follow-ups on file.   Procedures: No procedures performed  No notes on file   Clinical History: MRI LUMBAR SPINE WITHOUT CONTRAST  TECHNIQUE: Multiplanar, multisequence MR imaging of the lumbar spine was performed. No intravenous contrast was administered.  COMPARISON:  None.  FINDINGS: Vertebral body height is maintained. Trace anterolisthesis L4 on L5 due to facet arthropathy is noted. The conus medullaris is normal in signal and position. Imaged intra-abdominal contents are unremarkable.  T11-12 and T12-L1 are imaged in the sagittal plane only. T12-L1 is unremarkable. There is a shallow disc bulge at T11-12 but the central canal and foramina are open.  L1-2:  Negative.  L2-3:  Mild facet degenerative change.  Otherwise negative.  L3-4:  Mild facet degenerative change.  Otherwise negative.  L4-5: Moderately severe facet degenerative change is present. The disc is uncovered without bulging. The central canal is mildly narrowed. Mild to moderate foraminal narrowing is more notable on the left.  L5-S1: There is some facet degenerative change. No disc bulge or protrusion. The central canal and foramina are widely patent.  IMPRESSION: Mild spondylosis most notable at L4-5 where facet degenerative change results in trace anterolisthesis and there is mild to moderate foraminal narrowing, more notable on the left. The central canal is only mildly narrowed at this level. No nerve root compression is identified.   Electronically Signed   By: Inge Rise M.D.   On: 02/07/2016 13:03   She reports that she has never smoked. Her smokeless tobacco use includes chew. No results for input(s): HGBA1C, LABURIC in the last 8760 hours.  Objective:  VS:  HT:    WT:   BMI:     BP:124/61  HR:63bpm  TEMP: ( )  RESP:  Physical Exam Vitals signs and nursing note reviewed.  Constitutional:      General: She is not in acute distress.    Appearance:  Normal appearance. She is well-developed. She is obese. She is not ill-appearing.  HENT:     Head: Normocephalic and atraumatic.  Eyes:     Conjunctiva/sclera: Conjunctivae normal.     Pupils: Pupils are equal, round, and reactive to light.  Cardiovascular:     Rate and Rhythm: Normal rate.     Pulses: Normal pulses.  Pulmonary:     Effort: Pulmonary effort is normal.  Musculoskeletal:     Right lower leg: No edema.     Left lower leg: No edema.     Comments: Pain upon going from sit to stand in full extension with facet joint loading.  No pain with hip rotation good distal strength.  Pain to palpation on the right without focal trigger point.  No other worrisome findings.  Skin:    General: Skin is warm and dry.     Findings: No erythema or rash.  Neurological:     General: No focal deficit present.     Mental Status: She is alert and oriented to person, place, and time.  Sensory: No sensory deficit.     Motor: No abnormal muscle tone.     Coordination: Coordination normal.     Gait: Gait normal.  Psychiatric:        Mood and Affect: Mood normal.        Behavior: Behavior normal.     Ortho Exam Imaging: No results found.  Past Medical/Family/Surgical/Social History: Medications & Allergies reviewed per EMR, new medications updated. Patient Active Problem List   Diagnosis Date Noted  . Trigger finger, left ring finger 03/10/2018  . BPPV (benign paroxysmal positional vertigo) 10/22/2016  . Anxiety state 06/22/2016  . Tachycardia 04/25/2016  . Bilateral occipital neuralgia   . Cryptogenic stroke ([place]) 04/13/2016  . Cerebrovascular accident (CVA) due to thrombosis of right middle cerebral artery ([place]) 04/13/2016  . Cerebral infarction ([place])   . Thyroid activity decreased   . Depression   . Hypothyroidism   . GERD (gastroesophageal reflux disease)   . Hyperlipidemia   . Hypertension   . IBS (irritable bowel syndrome)    Past Medical History:  Diagnosis Date  .  Depression   . GERD (gastroesophageal reflux disease)   . Hypercholesterolemia   . Hypertension   . Hypothyroidism   . IBS (irritable bowel syndrome)   . Stroke ([place])   . Thyroid disease    Family History  Problem Relation Age of Onset  . Dementia Mother   . Hypercholesterolemia Mother   . Heart disease Father   . Stroke Father   . Heart disease Brother   . Congestive Heart Failure Sister   . Breast cancer Paternal Aunt    Past Surgical History:  Procedure Laterality Date  . ABDOMINAL HYSTERECTOMY    . APPENDECTOMY    . CESAREAN SECTION    . CHOLECYSTECTOMY    . EP IMPLANTABLE DEVICE N/A 12/28/2016   Procedure: Loop Recorder Insertion;  Surgeon: Evans Lance, MD;  Location: [facility];  Service: Cardiovascular;  Laterality: N/A;  . THYROIDECTOMY    . TRIGGER FINGER RELEASE     Social History   Occupational History  . Not on file  Tobacco Use  . Smoking status: Never Smoker  . Smokeless tobacco: Current User    Types: Chew  Substance and Sexual Activity  . Alcohol use: No  . Drug use: No  . Sexual activity: Not on file

## 2019-09-14 ENCOUNTER — Ambulatory Visit (INDEPENDENT_AMBULATORY_CARE_PROVIDER_SITE_OTHER): Payer: Medicare Other | Admitting: *Deleted

## 2019-09-14 DIAGNOSIS — I639 Cerebral infarction, unspecified: Secondary | ICD-10-CM | POA: Diagnosis not present

## 2019-09-14 LAB — CUP PACEART REMOTE DEVICE CHECK
Date Time Interrogation Session: 20201017173930
Implantable Pulse Generator Implant Date: 20180202

## 2019-09-23 NOTE — Progress Notes (Signed)
Cardiology Office Note:    Date:  09/24/2019   ID:  April Armstrong, DOB 1950/11/28, MRN [id number]  PCP:  April Ada, MD  Cardiologist:  April Grooms, MD   Referring MD: April Ada, MD   Chief Complaint  Patient presents with  . Follow-up    Cryptogenic stroke    History of Present Illness:    April Armstrong is a 68 y.o. female with a hx of cryptogenic stroke, ILR, essential hypertension, and hyperlipidemia.  The patient has no cardiac complaints.  She had a cryptogenic stroke a couple years ago without etiology being found.  She has an internal loop recorder.  She has hypertension and hyperlipidemia.  Her primary physician is Dr. Carol Armstrong.  She has no cardiac complaints.  No new neurological complaints.  No peripheral edema.  Overall she feels well.  She and her husband have Financial difficulties related to the COVID-19 pandemic.  Past Medical History:  Diagnosis Date  . Depression   . GERD (gastroesophageal reflux disease)   . Hypercholesterolemia   . Hypertension   . Hypothyroidism   . IBS (irritable bowel syndrome)   . Stroke ([place])   . Thyroid disease     Past Surgical History:  Procedure Laterality Date  . ABDOMINAL HYSTERECTOMY    . APPENDECTOMY    . CESAREAN SECTION    . CHOLECYSTECTOMY    . EP IMPLANTABLE DEVICE N/A 12/28/2016   Procedure: Loop Recorder Insertion;  Surgeon: Evans Lance, MD;  Location: [facility];  Service: Cardiovascular;  Laterality: N/A;  . THYROIDECTOMY    . TRIGGER FINGER RELEASE      Current Medications: Current Meds  Medication Sig  . aspirin 325 MG tablet Take 1 tablet (325 mg total) by mouth daily.  Marland Kitchen atorvastatin (LIPITOR) 80 MG tablet Take 1 tablet (80 mg total) by mouth at bedtime.  Marland Kitchen buPROPion (WELLBUTRIN XL) 300 MG 24 hr tablet Take 300 mg by mouth daily.   Marland Kitchen Dextromethorphan Polistirex (ROBITUSSIN 12 HOUR COUGH PO) Take by mouth as needed.  . diclofenac sodium (VOLTAREN) 1 % GEL APPLY 2-4  GRAMS TO LARGE JOINT AREA UP TO FOUR TIMES A DAY AS NEEDED  . lamoTRIgine (LAMICTAL) 100 MG tablet Take 100 mg by mouth daily.   Marland Kitchen levothyroxine (SYNTHROID, LEVOTHROID) 112 MCG tablet Take 112 mcg by mouth daily before breakfast.  . LORazepam (ATIVAN) 0.5 MG tablet Take 0.5 mg by mouth every 6 (six) hours as needed for anxiety.   . meloxicam (MOBIC) 15 MG tablet Take 1 tablet (15 mg total) by mouth daily. Take with food, for 2 weeks.  . metoprolol succinate (TOPROL-XL) 25 MG 24 hr tablet Take 25 mg by mouth daily.   Marland Kitchen omeprazole (PRILOSEC) 40 MG capsule Take 40 mg by mouth daily.   . RESTASIS 0.05 % ophthalmic emulsion Place 1 drop into both eyes 2 (two) times daily.   . sertraline (ZOLOFT) 100 MG tablet Take 100 mg by mouth daily.   Marland Kitchen spironolactone (ALDACTONE) 25 MG tablet Take 25 mg by mouth daily.   . traZODone (DESYREL) 150 MG tablet Take 75 mg by mouth at bedtime.   . vitamin B-12 (CYANOCOBALAMIN) 100 MCG tablet Take 100 mcg by mouth at bedtime.     Allergies:   Patient has no known allergies.   Social History   Socioeconomic History  . Marital status: Married    Spouse name: Not on file  . Number of children: Not on  file  . Years of education: Not on file  . Highest education level: Not on file  Occupational History  . Not on file  Social Needs  . Financial resource strain: Not on file  . Food insecurity    Worry: Not on file    Inability: Not on file  . Transportation needs    Medical: Not on file    Non-medical: Not on file  Tobacco Use  . Smoking status: Never Smoker  . Smokeless tobacco: Current User    Types: Chew  Substance and Sexual Activity  . Alcohol use: No  . Drug use: No  . Sexual activity: Not on file  Lifestyle  . Physical activity    Days per week: Not on file    Minutes per session: Not on file  . Stress: Not on file  Relationships  . Social Herbalist on phone: Not on file    Gets together: Not on file    Attends religious service:  Not on file    Active member of club or organization: Not on file    Attends meetings of clubs or organizations: Not on file    Relationship status: Not on file  Other Topics Concern  . Not on file  Social History Narrative  . Not on file     Family History: The patient's family history includes Breast cancer in her paternal aunt; Congestive Heart Failure in her sister; Dementia in her mother; Heart disease in her brother and father; Hypercholesterolemia in her mother; Stroke in her father.  ROS:   Please see the history of present illness.    Somewhat depressed and anxious.  Difficulty with balance since her neurological event.  All other systems reviewed and are negative.  EKGs/Labs/Other Studies Reviewed:    The following studies were reviewed today: No new cardiac data.  She has not always been available to provide download information from the loop recorder.  To this point no episodes of atrial fibrillation have been identified.  EKG:  EKG normal sinus rhythm, T wave flattening, and relatively low voltage.  Recent Labs: 10/03/2018: Hemoglobin 13.3; Platelets 293  Recent Lipid Panel    Component Value Date/Time   CHOL 167 04/13/2016 0512   TRIG 172 (H) 04/13/2016 0512   HDL 41 04/13/2016 0512   CHOLHDL 4.1 04/13/2016 0512   VLDL 34 04/13/2016 0512   LDLCALC 92 04/13/2016 0512    Physical Exam:    VS:  BP 120/66   Pulse 67   Ht 5\' 3"  (1.6 m)   Wt 230 lb 12.8 oz (104.7 kg)   SpO2 95%   BMI 40.88 kg/m     Wt Readings from Last 3 Encounters:  09/24/19 230 lb 12.8 oz (104.7 kg)  05/27/19 210 lb (95.3 kg)  06/06/18 224 lb 12.8 oz (102 kg)     GEN: Moderate obesity. No acute distress HEENT: Normal NECK: No JVD. LYMPHATICS: No lymphadenopathy CARDIAC:  RRR without murmur, gallop, or edema. VASCULAR:  Normal Pulses. No bruits. RESPIRATORY:  Clear to auscultation without rales, wheezing or rhonchi  ABDOMEN: Soft, non-tender, non-distended, No pulsatile mass,  MUSCULOSKELETAL: No deformity  SKIN: Warm and dry NEUROLOGIC:  Alert and oriented x 3 PSYCHIATRIC:  Normal affect   ASSESSMENT:    1. Cryptogenic stroke (HCC)   2. Essential hypertension   3. Mixed hyperlipidemia   4. Educated about COVID-19 virus infection    PLAN:    In order of problems listed above:  1. No new neurological complaints.  Still having difficulty with balance. 2. Excellent blood pressure control.  Plans to see her primary physician soon. 3. Lipids have not been evaluated in quite some time.  Dr. Carol Armstrong is her primary physician.  The patient has been for about clinical follow-up. 4. W3 is being adhered to to prevent COVID-19.  I encouraged patient to continue to be available to have downloads from her device to exclude atrial fib. Needs primary care follow-up for general health.   Medication Adjustments/Labs and Tests Ordered: Current medicines are reviewed at length with the patient today.  Concerns regarding medicines are outlined above.  Orders Placed This Encounter  Procedures  . EKG 12-Lead   No orders of the defined types were placed in this encounter.   Patient Instructions  Medication Instructions:  Your physician recommends that you continue on your current medications as directed. Please refer to the Current Medication list given to you today.  *If you need a refill on your cardiac medications before your next appointment, please call your pharmacy*  Lab Work: None If you have labs (blood work) drawn today and your tests are completely normal, you will receive your results only by: Marland Kitchen MyChart Message (if you have MyChart) OR . A paper copy in the mail If you have any lab test that is abnormal or we need to change your treatment, we will call you to review the results.  Testing/Procedures: None  Follow-Up: At [facility], you and your health needs are our priority.  As part of our continuing mission to provide you with exceptional  heart care, we have created designated Provider Care Teams.  These Care Teams include your primary Cardiologist (physician) and Advanced Practice Providers (APPs -  Physician Assistants and Nurse Practitioners) who all work together to provide you with the care you need, when you need it.  Your next appointment:   as needed  The format for your next appointment:   In Person  Provider:   You may see April Grooms, MD or one of the following Advanced Practice Providers on your designated Care Team:    Truitt Merle, NP  Cecilie Kicks, NP  Kathyrn Drown, NP   Other Instructions      Signed, April Grooms, MD  09/24/2019 4:18 PM    [place]

## 2019-09-24 ENCOUNTER — Encounter (INDEPENDENT_AMBULATORY_CARE_PROVIDER_SITE_OTHER): Payer: Self-pay

## 2019-09-24 ENCOUNTER — Encounter: Payer: Self-pay | Admitting: Interventional Cardiology

## 2019-09-24 ENCOUNTER — Ambulatory Visit (INDEPENDENT_AMBULATORY_CARE_PROVIDER_SITE_OTHER): Payer: Medicare Other | Admitting: Interventional Cardiology

## 2019-09-24 VITALS — BP 120/66 | HR 67 | Ht 63.0 in | Wt 230.8 lb

## 2019-09-24 DIAGNOSIS — I639 Cerebral infarction, unspecified: Secondary | ICD-10-CM

## 2019-09-24 DIAGNOSIS — I10 Essential (primary) hypertension: Secondary | ICD-10-CM

## 2019-09-24 DIAGNOSIS — Z7189 Other specified counseling: Secondary | ICD-10-CM

## 2019-09-24 DIAGNOSIS — E782 Mixed hyperlipidemia: Secondary | ICD-10-CM | POA: Diagnosis not present

## 2019-09-24 NOTE — Patient Instructions (Signed)
Medication Instructions:  Your physician recommends that you continue on your current medications as directed. Please refer to the Current Medication list given to you today.  *If you need a refill on your cardiac medications before your next appointment, please call your pharmacy*  Lab Work: None If you have labs (blood work) drawn today and your tests are completely normal, you will receive your results only by: Marland Kitchen MyChart Message (if you have MyChart) OR . A paper copy in the mail If you have any lab test that is abnormal or we need to change your treatment, we will call you to review the results.  Testing/Procedures: None  Follow-Up: At [facility], you and your health needs are our priority.  As part of our continuing mission to provide you with exceptional heart care, we have created designated Provider Care Teams.  These Care Teams include your primary Cardiologist (physician) and Advanced Practice Providers (APPs -  Physician Assistants and Nurse Practitioners) who all work together to provide you with the care you need, when you need it.  Your next appointment:   as needed  The format for your next appointment:   In Person  Provider:   You may see Sinclair Grooms, MD or one of the following Advanced Practice Providers on your designated Care Team:    Truitt Merle, NP  Cecilie Kicks, NP  Kathyrn Drown, NP   Other Instructions

## 2019-09-25 DIAGNOSIS — M722 Plantar fascial fibromatosis: Secondary | ICD-10-CM | POA: Diagnosis not present

## 2019-09-25 DIAGNOSIS — M71571 Other bursitis, not elsewhere classified, right ankle and foot: Secondary | ICD-10-CM | POA: Diagnosis not present

## 2019-09-25 DIAGNOSIS — M7731 Calcaneal spur, right foot: Secondary | ICD-10-CM | POA: Diagnosis not present

## 2019-09-25 DIAGNOSIS — M71572 Other bursitis, not elsewhere classified, left ankle and foot: Secondary | ICD-10-CM | POA: Diagnosis not present

## 2019-09-25 DIAGNOSIS — M7662 Achilles tendinitis, left leg: Secondary | ICD-10-CM | POA: Diagnosis not present

## 2019-09-25 DIAGNOSIS — M7732 Calcaneal spur, left foot: Secondary | ICD-10-CM | POA: Diagnosis not present

## 2019-10-01 ENCOUNTER — Other Ambulatory Visit: Payer: Self-pay

## 2019-10-01 DIAGNOSIS — M71572 Other bursitis, not elsewhere classified, left ankle and foot: Secondary | ICD-10-CM | POA: Diagnosis not present

## 2019-10-01 DIAGNOSIS — M722 Plantar fascial fibromatosis: Secondary | ICD-10-CM | POA: Diagnosis not present

## 2019-10-01 DIAGNOSIS — M71571 Other bursitis, not elsewhere classified, right ankle and foot: Secondary | ICD-10-CM | POA: Diagnosis not present

## 2019-10-01 DIAGNOSIS — M7662 Achilles tendinitis, left leg: Secondary | ICD-10-CM | POA: Diagnosis not present

## 2019-10-05 NOTE — Progress Notes (Signed)
[facility] Summary Report / Loop Recorder 

## 2019-10-12 DIAGNOSIS — M722 Plantar fascial fibromatosis: Secondary | ICD-10-CM | POA: Diagnosis not present

## 2019-10-12 DIAGNOSIS — M7662 Achilles tendinitis, left leg: Secondary | ICD-10-CM | POA: Diagnosis not present

## 2019-10-15 ENCOUNTER — Ambulatory Visit (INDEPENDENT_AMBULATORY_CARE_PROVIDER_SITE_OTHER): Payer: Medicare Other | Admitting: *Deleted

## 2019-10-15 DIAGNOSIS — I639 Cerebral infarction, unspecified: Secondary | ICD-10-CM | POA: Diagnosis not present

## 2019-10-15 LAB — CUP PACEART REMOTE DEVICE CHECK
Date Time Interrogation Session: 20201119140455
Implantable Pulse Generator Implant Date: 20180202

## 2019-10-19 DIAGNOSIS — M71572 Other bursitis, not elsewhere classified, left ankle and foot: Secondary | ICD-10-CM | POA: Diagnosis not present

## 2019-10-19 DIAGNOSIS — M71571 Other bursitis, not elsewhere classified, right ankle and foot: Secondary | ICD-10-CM | POA: Diagnosis not present

## 2019-10-19 DIAGNOSIS — M7662 Achilles tendinitis, left leg: Secondary | ICD-10-CM | POA: Diagnosis not present

## 2019-10-19 DIAGNOSIS — M722 Plantar fascial fibromatosis: Secondary | ICD-10-CM | POA: Diagnosis not present

## 2019-10-29 DIAGNOSIS — F339 Major depressive disorder, recurrent, unspecified: Secondary | ICD-10-CM | POA: Diagnosis not present

## 2019-10-29 DIAGNOSIS — E890 Postprocedural hypothyroidism: Secondary | ICD-10-CM | POA: Diagnosis not present

## 2019-10-29 DIAGNOSIS — I10 Essential (primary) hypertension: Secondary | ICD-10-CM | POA: Diagnosis not present

## 2019-10-29 DIAGNOSIS — K219 Gastro-esophageal reflux disease without esophagitis: Secondary | ICD-10-CM | POA: Diagnosis not present

## 2019-10-29 DIAGNOSIS — E7800 Pure hypercholesterolemia, unspecified: Secondary | ICD-10-CM | POA: Diagnosis not present

## 2019-11-13 NOTE — Progress Notes (Signed)
[facility] Summary Report / Loop Recorder 

## 2019-11-17 ENCOUNTER — Ambulatory Visit (INDEPENDENT_AMBULATORY_CARE_PROVIDER_SITE_OTHER): Payer: Medicare Other | Admitting: *Deleted

## 2019-11-17 DIAGNOSIS — I639 Cerebral infarction, unspecified: Secondary | ICD-10-CM

## 2019-11-17 LAB — CUP PACEART REMOTE DEVICE CHECK
Date Time Interrogation Session: 20201222140038
Implantable Pulse Generator Implant Date: 20180202

## 2019-11-25 ENCOUNTER — Encounter (INDEPENDENT_AMBULATORY_CARE_PROVIDER_SITE_OTHER): Payer: Self-pay

## 2019-11-25 ENCOUNTER — Other Ambulatory Visit: Payer: Self-pay

## 2019-11-25 ENCOUNTER — Ambulatory Visit (INDEPENDENT_AMBULATORY_CARE_PROVIDER_SITE_OTHER): Payer: Medicare Other | Admitting: Adult Health

## 2019-11-25 ENCOUNTER — Encounter: Payer: Self-pay | Admitting: Adult Health

## 2019-11-25 DIAGNOSIS — G4700 Insomnia, unspecified: Secondary | ICD-10-CM

## 2019-11-25 DIAGNOSIS — F331 Major depressive disorder, recurrent, moderate: Secondary | ICD-10-CM | POA: Diagnosis not present

## 2019-11-25 DIAGNOSIS — F411 Generalized anxiety disorder: Secondary | ICD-10-CM | POA: Diagnosis not present

## 2019-11-25 MED ORDER — TRAZODONE HCL 150 MG PO TABS: 75.0000 mg | ORAL_TABLET | Freq: Every day | ORAL | 5 refills | Status: DC

## 2019-11-25 MED ORDER — BUPROPION HCL ER (XL) 300 MG PO TB24: 300.0000 mg | ORAL_TABLET | Freq: Every day | ORAL | 5 refills | Status: DC

## 2019-11-25 MED ORDER — LAMOTRIGINE 100 MG PO TABS: 100.0000 mg | ORAL_TABLET | Freq: Every day | ORAL | 5 refills | Status: DC

## 2019-11-25 MED ORDER — SERTRALINE HCL 100 MG PO TABS: 100.0000 mg | ORAL_TABLET | Freq: Every day | ORAL | 5 refills | Status: DC

## 2019-11-25 NOTE — Progress Notes (Signed)
[facility] MD/PA/NP Initial Note  11/25/2019 3:52 PM April Armstrong  MRN:  [id number]  Chief Complaint:  Chief Complaint    Depression; Anxiety; Insomnia      HPI:   Describes mood today as "ok". Pleasant. Mood symptoms - denies depression, anxiety, and irritability. Stating "I feel alright". Having some "issues" with election. Rumination - also thinking "waht if". Bought a house a few years ago and is constantly wanting to make changes to it. Stating "I can stay up all night thinking". Mostly up until 3 in the morning. Stable interest and motivation. Taking medications as prescribed.  Energy levels stable. Active, has started an exercise routine - walking 5000 steps a day. Retired.  Enjoys some usual interests and activities. Married. Lives with husband of 5 years and 3 dogs. Has 3 daughters and 8 grandchildren. Lost one son. Spending time with family. Appetite adequate - "not ever really hungry". Eating late at night. Weight stable. Sleeps well most nights. Averages 6 to 7 hours. Focus and concentration stable. Completing tasks. Managing aspects of household.  Denies SI or HI. Denies AH or VH.  Visit Diagnosis:    ICD-10-CM   1. Generalized anxiety disorder  F41.1   2. Major depressive disorder, recurrent episode, moderate (HCC)  F33.1   3. Insomnia, unspecified type  G47.00     Past Psychiatric History: Denies psychiatric hospitalization.  Past Medical History:  Past Medical History:  Diagnosis Date  . Depression   . GERD (gastroesophageal reflux disease)   . Hypercholesterolemia   . Hypertension   . Hypothyroidism   . IBS (irritable bowel syndrome)   . Stroke ([place])   . Thyroid disease     Past Surgical History:  Procedure Laterality Date  . ABDOMINAL HYSTERECTOMY    . APPENDECTOMY    . CESAREAN SECTION    . CHOLECYSTECTOMY    . EP IMPLANTABLE DEVICE N/A 12/28/2016   Procedure: Loop Recorder Insertion;  Surgeon: Evans Lance, MD;  Location: [facility];   Service: Cardiovascular;  Laterality: N/A;  . THYROIDECTOMY    . TRIGGER FINGER RELEASE      Family Psychiatric History: Denies any family history of mental illness.  Family History:  Family History  Problem Relation Age of Onset  . Dementia Mother   . Hypercholesterolemia Mother   . Heart disease Father   . Stroke Father   . Heart disease Brother   . Congestive Heart Failure Sister   . Breast cancer Paternal Aunt     Social History:  Social History   Socioeconomic History  . Marital status: Married    Spouse name: Not on file  . Number of children: Not on file  . Years of education: Not on file  . Highest education level: Not on file  Occupational History  . Not on file  Tobacco Use  . Smoking status: Never Smoker  . Smokeless tobacco: Current User    Types: Chew  Substance and Sexual Activity  . Alcohol use: No  . Drug use: No  . Sexual activity: Not on file  Other Topics Concern  . Not on file  Social History Narrative  . Not on file   Social Determinants of Health   Financial Resource Strain:   . Difficulty of Paying Living Expenses: Not on file  Food Insecurity:   . Worried About Charity fundraiser in the Last Year: Not on file  . Ran Out of Food in the Last Year: Not on file  Transportation Needs:   . Film/video editor (Medical): Not on file  . Lack of Transportation (Non-Medical): Not on file  Physical Activity:   . Days of Exercise per Week: Not on file  . Minutes of Exercise per Session: Not on file  Stress:   . Feeling of Stress : Not on file  Social Connections:   . Frequency of Communication with Friends and Family: Not on file  . Frequency of Social Gatherings with Friends and Family: Not on file  . Attends Religious Services: Not on file  . Active Member of Clubs or Organizations: Not on file  . Attends Archivist Meetings: Not on file  . Marital Status: Not on file    Allergies: No Known Allergies  Metabolic Disorder  Labs: Lab Results  Component Value Date   HGBA1C 6.2 (H) 04/13/2016   MPG 131 04/13/2016   No results found for: PROLACTIN Lab Results  Component Value Date   CHOL 167 04/13/2016   TRIG 172 (H) 04/13/2016   HDL 41 04/13/2016   CHOLHDL 4.1 04/13/2016   VLDL 34 04/13/2016   LDLCALC 92 04/13/2016   Lab Results  Component Value Date   TSH 3.067 04/13/2016    Therapeutic Level Labs: No results found for: LITHIUM No results found for: VALPROATE No components found for:  CBMZ  Current Medications: Current Outpatient Medications  Medication Sig Dispense Refill  . aspirin 325 MG tablet Take 1 tablet (325 mg total) by mouth daily.    Marland Kitchen atorvastatin (LIPITOR) 80 MG tablet Take 1 tablet (80 mg total) by mouth at bedtime. 30 tablet 0  . BESIVANCE 0.6 % SUSP Place 1 drop into the left eye 3 (three) times daily.    Marland Kitchen buPROPion (WELLBUTRIN XL) 300 MG 24 hr tablet Take 300 mg by mouth daily.     Marland Kitchen Dextromethorphan Polistirex (ROBITUSSIN 12 HOUR COUGH PO) Take by mouth as needed.    . diclofenac sodium (VOLTAREN) 1 % GEL APPLY 2-4 GRAMS TO LARGE JOINT AREA UP TO FOUR TIMES A DAY AS NEEDED 2 Tube 3  . DUREZOL 0.05 % EMUL Place 1 drop into the left eye 3 (three) times daily.    Marland Kitchen lamoTRIgine (LAMICTAL) 100 MG tablet Take 100 mg by mouth daily.     Marland Kitchen levothyroxine (SYNTHROID) 100 MCG tablet Take 100 mcg by mouth daily.    Marland Kitchen levothyroxine (SYNTHROID, LEVOTHROID) 112 MCG tablet Take 112 mcg by mouth daily before breakfast.    . LORazepam (ATIVAN) 0.5 MG tablet Take 0.5 mg by mouth every 6 (six) hours as needed for anxiety.     . meloxicam (MOBIC) 15 MG tablet Take 1 tablet (15 mg total) by mouth daily. Take with food, for 2 weeks. 30 tablet 0  . metoprolol succinate (TOPROL-XL) 25 MG 24 hr tablet Take 25 mg by mouth daily.     Marland Kitchen omeprazole (PRILOSEC) 40 MG capsule Take 40 mg by mouth daily.     Marland Kitchen PROLENSA 0.07 % SOLN Place 1 drop into the left eye 2 (two) times daily.    . RESTASIS 0.05 %  ophthalmic emulsion Place 1 drop into both eyes 2 (two) times daily.     . sertraline (ZOLOFT) 100 MG tablet Take 100 mg by mouth daily.     Marland Kitchen spironolactone (ALDACTONE) 25 MG tablet Take 25 mg by mouth daily.     . traZODone (DESYREL) 150 MG tablet Take 75 mg by mouth at bedtime.     Marland Kitchen  vitamin B-12 (CYANOCOBALAMIN) 100 MCG tablet Take 100 mcg by mouth at bedtime.     No current facility-administered medications for this visit.    Medication Side Effects: none [email or web address] Orders placed this visit:  No orders of the defined types were placed in this encounter.   Psychiatric Specialty Exam:  Review of Systems  There were no vitals taken for this visit.There is no height or weight on file to calculate BMI.  General Appearance: Neat and Well Groomed  Eye Contact:  Negative  Speech:  Clear and Coherent and Normal Rate  Volume:  Normal  Mood:  Euthymic  Affect:  Appropriate and Congruent  Thought Process:  Coherent and Descriptions of Associations: Intact  Orientation:  Full (Time, Place, and Person)  Thought Content: Logical   Suicidal Thoughts:  No  Homicidal Thoughts:  No  Memory:  WNL  Judgement:  Good  Insight:  Good  Psychomotor Activity:  Normal  Concentration:  Concentration: Good  Recall:  Good  Fund of Knowledge: Good  Language: Good  Assets:  Communication Skills Desire for Improvement Financial Resources/Insurance Housing Intimacy Leisure Time Physical Health Resilience Social Support Talents/Skills Transportation Vocational/Educational  ADL's:  Intact  Cognition: WNL  Prognosis:  Good   Screenings: None  Receiving Psychotherapy: No   Treatment Plan/Recommendations:   Plan:  1. Lamictal 100mg  daily 2. Wellbutrin XL 300mg  daily 3. Zoloft 100mg  daily 4. Trazadone 150mg  daily - taking 75mg  at hs  RTC 3 months  Patient advised to contact office with any questions, adverse effects, or acute worsening in signs and symptoms.  Aloha Gell, NP

## 2019-12-21 ENCOUNTER — Ambulatory Visit (INDEPENDENT_AMBULATORY_CARE_PROVIDER_SITE_OTHER): Payer: Medicare Other | Admitting: *Deleted

## 2019-12-21 DIAGNOSIS — I639 Cerebral infarction, unspecified: Secondary | ICD-10-CM | POA: Diagnosis not present

## 2019-12-21 LAB — CUP PACEART REMOTE DEVICE CHECK
Date Time Interrogation Session: 20210124231006
Implantable Pulse Generator Implant Date: 20180202

## 2019-12-23 DIAGNOSIS — Z961 Presence of intraocular lens: Secondary | ICD-10-CM | POA: Diagnosis not present

## 2019-12-23 DIAGNOSIS — H26492 Other secondary cataract, left eye: Secondary | ICD-10-CM | POA: Diagnosis not present

## 2019-12-23 DIAGNOSIS — I10 Essential (primary) hypertension: Secondary | ICD-10-CM | POA: Diagnosis not present

## 2019-12-23 DIAGNOSIS — H26491 Other secondary cataract, right eye: Secondary | ICD-10-CM | POA: Diagnosis not present

## 2019-12-25 DIAGNOSIS — F339 Major depressive disorder, recurrent, unspecified: Secondary | ICD-10-CM | POA: Diagnosis not present

## 2019-12-25 DIAGNOSIS — F329 Major depressive disorder, single episode, unspecified: Secondary | ICD-10-CM | POA: Diagnosis not present

## 2019-12-25 DIAGNOSIS — E7800 Pure hypercholesterolemia, unspecified: Secondary | ICD-10-CM | POA: Diagnosis not present

## 2019-12-25 DIAGNOSIS — E890 Postprocedural hypothyroidism: Secondary | ICD-10-CM | POA: Diagnosis not present

## 2019-12-25 DIAGNOSIS — I639 Cerebral infarction, unspecified: Secondary | ICD-10-CM | POA: Diagnosis not present

## 2019-12-25 DIAGNOSIS — I10 Essential (primary) hypertension: Secondary | ICD-10-CM | POA: Diagnosis not present

## 2019-12-31 DIAGNOSIS — L570 Actinic keratosis: Secondary | ICD-10-CM | POA: Diagnosis not present

## 2019-12-31 DIAGNOSIS — L821 Other seborrheic keratosis: Secondary | ICD-10-CM | POA: Diagnosis not present

## 2019-12-31 DIAGNOSIS — L820 Inflamed seborrheic keratosis: Secondary | ICD-10-CM | POA: Diagnosis not present

## 2020-01-13 DIAGNOSIS — H26492 Other secondary cataract, left eye: Secondary | ICD-10-CM | POA: Diagnosis not present

## 2020-01-21 ENCOUNTER — Ambulatory Visit (INDEPENDENT_AMBULATORY_CARE_PROVIDER_SITE_OTHER): Payer: Medicare Other | Admitting: *Deleted

## 2020-01-21 DIAGNOSIS — I639 Cerebral infarction, unspecified: Secondary | ICD-10-CM

## 2020-01-21 LAB — CUP PACEART REMOTE DEVICE CHECK
Date Time Interrogation Session: 20210224234935
Implantable Pulse Generator Implant Date: 20180202

## 2020-01-22 NOTE — Progress Notes (Signed)
ILR Remote 

## 2020-01-28 DIAGNOSIS — M79605 Pain in left leg: Secondary | ICD-10-CM | POA: Diagnosis not present

## 2020-01-28 DIAGNOSIS — M79604 Pain in right leg: Secondary | ICD-10-CM | POA: Diagnosis not present

## 2020-02-08 ENCOUNTER — Telehealth: Payer: Self-pay

## 2020-02-08 NOTE — Telephone Encounter (Signed)
Unable to speak to patient regarding disconnected monitor

## 2020-02-11 DIAGNOSIS — E7800 Pure hypercholesterolemia, unspecified: Secondary | ICD-10-CM | POA: Diagnosis not present

## 2020-02-11 DIAGNOSIS — E890 Postprocedural hypothyroidism: Secondary | ICD-10-CM | POA: Diagnosis not present

## 2020-02-11 DIAGNOSIS — I10 Essential (primary) hypertension: Secondary | ICD-10-CM | POA: Diagnosis not present

## 2020-02-11 DIAGNOSIS — F339 Major depressive disorder, recurrent, unspecified: Secondary | ICD-10-CM | POA: Diagnosis not present

## 2020-02-11 DIAGNOSIS — I639 Cerebral infarction, unspecified: Secondary | ICD-10-CM | POA: Diagnosis not present

## 2020-02-11 DIAGNOSIS — G4700 Insomnia, unspecified: Secondary | ICD-10-CM | POA: Diagnosis not present

## 2020-02-11 DIAGNOSIS — F329 Major depressive disorder, single episode, unspecified: Secondary | ICD-10-CM | POA: Diagnosis not present

## 2020-02-17 DIAGNOSIS — M79605 Pain in left leg: Secondary | ICD-10-CM | POA: Diagnosis not present

## 2020-02-17 DIAGNOSIS — I8311 Varicose veins of right lower extremity with inflammation: Secondary | ICD-10-CM | POA: Diagnosis not present

## 2020-02-17 DIAGNOSIS — I8312 Varicose veins of left lower extremity with inflammation: Secondary | ICD-10-CM | POA: Diagnosis not present

## 2020-02-17 DIAGNOSIS — M79604 Pain in right leg: Secondary | ICD-10-CM | POA: Diagnosis not present

## 2020-02-19 DIAGNOSIS — E890 Postprocedural hypothyroidism: Secondary | ICD-10-CM | POA: Diagnosis not present

## 2020-02-19 DIAGNOSIS — R739 Hyperglycemia, unspecified: Secondary | ICD-10-CM | POA: Diagnosis not present

## 2020-02-19 DIAGNOSIS — E7800 Pure hypercholesterolemia, unspecified: Secondary | ICD-10-CM | POA: Diagnosis not present

## 2020-02-19 DIAGNOSIS — I10 Essential (primary) hypertension: Secondary | ICD-10-CM | POA: Diagnosis not present

## 2020-02-21 LAB — CUP PACEART REMOTE DEVICE CHECK
Date Time Interrogation Session: 20210328021341
Implantable Pulse Generator Implant Date: 20180202

## 2020-02-22 ENCOUNTER — Ambulatory Visit (INDEPENDENT_AMBULATORY_CARE_PROVIDER_SITE_OTHER): Payer: Medicare Other | Admitting: *Deleted

## 2020-02-22 DIAGNOSIS — I639 Cerebral infarction, unspecified: Secondary | ICD-10-CM

## 2020-02-22 NOTE — Progress Notes (Signed)
ILR Remote 

## 2020-02-23 ENCOUNTER — Ambulatory Visit: Payer: Medicare Other | Admitting: Adult Health

## 2020-03-08 ENCOUNTER — Ambulatory Visit (INDEPENDENT_AMBULATORY_CARE_PROVIDER_SITE_OTHER): Payer: Medicare Other | Admitting: Adult Health

## 2020-03-08 ENCOUNTER — Encounter: Payer: Self-pay | Admitting: Adult Health

## 2020-03-08 DIAGNOSIS — F331 Major depressive disorder, recurrent, moderate: Secondary | ICD-10-CM

## 2020-03-08 DIAGNOSIS — F411 Generalized anxiety disorder: Secondary | ICD-10-CM

## 2020-03-08 DIAGNOSIS — G4700 Insomnia, unspecified: Secondary | ICD-10-CM

## 2020-03-08 DIAGNOSIS — F410 Panic disorder [episodic paroxysmal anxiety] without agoraphobia: Secondary | ICD-10-CM

## 2020-03-08 MED ORDER — BUPROPION HCL ER (XL) 300 MG PO TB24: 300.0000 mg | ORAL_TABLET | Freq: Every day | ORAL | 5 refills | Status: DC

## 2020-03-08 MED ORDER — TRAZODONE HCL 150 MG PO TABS: 75.0000 mg | ORAL_TABLET | Freq: Every day | ORAL | 5 refills | Status: DC

## 2020-03-08 MED ORDER — SERTRALINE HCL 100 MG PO TABS: 100.0000 mg | ORAL_TABLET | Freq: Every day | ORAL | 5 refills | Status: DC

## 2020-03-08 MED ORDER — LORAZEPAM 0.5 MG PO TABS: 0.5000 mg | ORAL_TABLET | Freq: Every day | ORAL | 0 refills | Status: DC | PRN

## 2020-03-08 MED ORDER — LAMOTRIGINE 100 MG PO TABS: 100.0000 mg | ORAL_TABLET | Freq: Every day | ORAL | 5 refills | Status: DC

## 2020-03-08 NOTE — Progress Notes (Signed)
April Armstrong [id number] 1951/07/13 69 y.o.  Virtual Visit via Telephone Note  I connected with pt on 03/08/20 at  1:20 PM EDT by telephone and verified that I am speaking with the correct person using two identifiers.   I discussed the limitations, risks, security and privacy concerns of performing an evaluation and management service by telephone and the availability of in person appointments. I also discussed with the patient that there may be a patient responsible charge related to this service. The patient expressed understanding and agreed to proceed.   I discussed the assessment and treatment plan with the patient. The patient was provided an opportunity to ask questions and all were answered. The patient agreed with the plan and demonstrated an understanding of the instructions.   The patient was advised to call back or seek an in-person evaluation if the symptoms worsen or if the condition fails to improve as anticipated.  I provided 30 minutes of non-face-to-face time during this encounter.  The patient was located at home.  The provider was located at [place].   Aloha Gell, NP   Subjective:   Patient ID:  April Armstrong is a 69 y.o. (DOB [birth year]) female.  Chief Complaint: No chief complaint on file.   HPI April Armstrong presents for follow-up of MDD, GAD, insomnia, and panic attacks.   Describes mood today as "ok". Pleasant. Mood symptoms - denies depression and irritability. Feels more anxious overall "a little bit all the time". Has panic attacks. Tries to utilize "breathing techniques" when anxious. Stating "I'm doing good".  Upset with the election results. Has stopped reading and listening to the news - "I pick and choose what I listen too". Stable interest and motivation. Taking medications as prescribed.  Energy levels stable. Active, has started an exercise routine. Walking daily. Walking dogs. Working in the yard. Vacuuming every  day.  Enjoys some usual interests and activities. Married. Lives with husband of 34 years and 3 dogs. Has 3 daughters and 8 grandchildren. Appetite adequate - "I have to watch my sugar". Weight stable. Sleeps well most nights. Averages 5 hours - "feels rested".  Focus and concentration stable. Completing tasks. Managing aspects of household.  Denies SI or HI. Denies AH or VH.  Review of Systems:  Review of Systems  Musculoskeletal: Negative for gait problem.  Neurological: Negative for tremors.  Psychiatric/Behavioral:       Please refer to HPI    Medications: I have reviewed the patient's current medications.  Current Outpatient Medications  Medication Sig Dispense Refill  . aspirin 325 MG tablet Take 1 tablet (325 mg total) by mouth daily.    Marland Kitchen atorvastatin (LIPITOR) 80 MG tablet Take 1 tablet (80 mg total) by mouth at bedtime. 30 tablet 0  . BESIVANCE 0.6 % SUSP Place 1 drop into the left eye 3 (three) times daily.    Marland Kitchen buPROPion (WELLBUTRIN XL) 300 MG 24 hr tablet Take 1 tablet (300 mg total) by mouth daily. 30 tablet 5  . Dextromethorphan Polistirex (ROBITUSSIN 12 HOUR COUGH PO) Take by mouth as needed.    . diclofenac sodium (VOLTAREN) 1 % GEL APPLY 2-4 GRAMS TO LARGE JOINT AREA UP TO FOUR TIMES A DAY AS NEEDED 2 Tube 3  . DUREZOL 0.05 % EMUL Place 1 drop into the left eye 3 (three) times daily.    Marland Kitchen lamoTRIgine (LAMICTAL) 100 MG tablet Take 1 tablet (100 mg total) by mouth daily. 30 tablet 5  . levothyroxine (  SYNTHROID) 100 MCG tablet Take 100 mcg by mouth daily.    Marland Kitchen levothyroxine (SYNTHROID, LEVOTHROID) 112 MCG tablet Take 112 mcg by mouth daily before breakfast.    . LORazepam (ATIVAN) 0.5 MG tablet Take 0.5 mg by mouth every 6 (six) hours as needed for anxiety.     . meloxicam (MOBIC) 15 MG tablet Take 1 tablet (15 mg total) by mouth daily. Take with food, for 2 weeks. 30 tablet 0  . metoprolol succinate (TOPROL-XL) 25 MG 24 hr tablet Take 25 mg by mouth daily.     Marland Kitchen  omeprazole (PRILOSEC) 40 MG capsule Take 40 mg by mouth daily.     Marland Kitchen PROLENSA 0.07 % SOLN Place 1 drop into the left eye 2 (two) times daily.    . RESTASIS 0.05 % ophthalmic emulsion Place 1 drop into both eyes 2 (two) times daily.     . sertraline (ZOLOFT) 100 MG tablet Take 1 tablet (100 mg total) by mouth daily. 30 tablet 5  . spironolactone (ALDACTONE) 25 MG tablet Take 25 mg by mouth daily.     . traZODone (DESYREL) 150 MG tablet Take 0.5 tablets (75 mg total) by mouth at bedtime. 30 tablet 5  . vitamin B-12 (CYANOCOBALAMIN) 100 MCG tablet Take 100 mcg by mouth at bedtime.     No current facility-administered medications for this visit.    Medication Side Effects: None  Allergies: No Known Allergies  Past Medical History:  Diagnosis Date  . Depression   . GERD (gastroesophageal reflux disease)   . Hypercholesterolemia   . Hypertension   . Hypothyroidism   . IBS (irritable bowel syndrome)   . Stroke ([place])   . Thyroid disease     Family History  Problem Relation Age of Onset  . Dementia Mother   . Hypercholesterolemia Mother   . Heart disease Father   . Stroke Father   . Heart disease Brother   . Congestive Heart Failure Sister   . Breast cancer Paternal Aunt     Social History   Socioeconomic History  . Marital status: Married    Spouse name: Not on file  . Number of children: Not on file  . Years of education: Not on file  . Highest education level: Not on file  Occupational History  . Not on file  Tobacco Use  . Smoking status: Never Smoker  . Smokeless tobacco: Current User    Types: Chew  Substance and Sexual Activity  . Alcohol use: No  . Drug use: No  . Sexual activity: Not on file  Other Topics Concern  . Not on file  Social History Narrative  . Not on file   Social Determinants of Health   Financial Resource Strain:   . Difficulty of Paying Living Expenses:   Food Insecurity:   . Worried About Charity fundraiser in the Last Year:   . Arts development officer in the Last Year:   Transportation Needs:   . Film/video editor (Medical):   Marland Kitchen Lack of Transportation (Non-Medical):   Physical Activity:   . Days of Exercise per Week:   . Minutes of Exercise per Session:   Stress:   . Feeling of Stress :   Social Connections:   . Frequency of Communication with Friends and Family:   . Frequency of Social Gatherings with Friends and Family:   . Attends Religious Services:   . Active Member of Clubs or Organizations:   . Attends Club  or Organization Meetings:   Marland Kitchen Marital Status:   Intimate Partner Violence:   . Fear of Current or Ex-Partner:   . Emotionally Abused:   Marland Kitchen Physically Abused:   . Sexually Abused:     Past Medical History, Surgical history, Social history, and Family history were reviewed and updated as appropriate.   Please see review of systems for further details on the patient's review from today.   Objective:   Physical Exam:  There were no vitals taken for this visit.  Physical Exam Constitutional:      General: She is not in acute distress. Musculoskeletal:        General: No deformity.  Neurological:     Mental Status: She is alert and oriented to person, place, and time.     Coordination: Coordination normal.  Psychiatric:        Attention and Perception: Attention and perception normal. She does not perceive auditory or visual hallucinations.        Mood and Affect: Mood is anxious. Mood is not depressed. Affect is not labile, blunt, angry or inappropriate.        Speech: Speech normal.        Behavior: Behavior normal.        Thought Content: Thought content normal. Thought content is not paranoid or delusional. Thought content does not include homicidal or suicidal ideation. Thought content does not include homicidal or suicidal plan.        Cognition and Memory: Cognition and memory normal.        Judgment: Judgment normal.     Comments: Insight intact     Lab Review:     Component Value  Date/Time   NA 140 04/12/2016 1925   K 4.0 04/12/2016 1925   CL 105 04/12/2016 1925   CO2 28 04/12/2016 1925   GLUCOSE 97 04/12/2016 1925   BUN 21 (H) 04/12/2016 1925   CREATININE 0.77 04/12/2016 1925   CALCIUM 9.4 04/12/2016 1925   GFRNONAA >60 04/12/2016 1925   GFRAA >60 04/12/2016 1925       Component Value Date/Time   WBC 6.4 10/03/2018 1707   RBC 4.48 10/03/2018 1707   HGB 13.3 10/03/2018 1707   HCT 42.7 10/03/2018 1707   PLT 293 10/03/2018 1707   MCV 95.3 10/03/2018 1707   MCH 29.7 10/03/2018 1707   MCHC 31.1 10/03/2018 1707   RDW 12.7 10/03/2018 1707    No results found for: POCLITH, LITHIUM   No results found for: PHENYTOIN, PHENOBARB, VALPROATE, CBMZ   .res Assessment: Plan:    Plan:  1. Lamictal 100mg  daily 2. Wellbutrin XL 300mg  daily 3. Zoloft 100mg  daily 4. Trazadone 150mg  daily - taking 75mg  at hs  RTC 3 months  Patient advised to contact office with any questions, adverse effects, or acute worsening in signs and symptoms.  Counseled patient regarding potential benefits, risks, and side effects of Lamictal to include potential risk of Stevens-Johnson syndrome. Advised patient to stop taking Lamictal and contact office immediately if rash develops and to seek urgent medical attention if rash is severe and/or spreading quickly.  Discussed potential benefits, risk, and side effects of benzodiazepines to include potential risk of tolerance and dependence, as well as possible drowsiness.  Advised patient not to drive if experiencing drowsiness and to take lowest possible effective dose to minimize risk of dependence and tolerance.  There are no diagnoses linked to this encounter.  Please see After Visit Summary for patient specific instructions.  Future  Appointments  Date Time Provider [place]  03/08/2020  1:20 PM Dimitri Shakespeare, Berdie Ogren, NP [facility] None  03/28/2020  7:55 AM [facility] DEVICE REMOTES [facility]  05/02/2020  7:55 AM  [facility] DEVICE REMOTES [facility]  06/06/2020  7:55 AM [facility] DEVICE REMOTES [facility]  07/11/2020  7:55 AM [facility] DEVICE REMOTES [facility]  08/15/2020  7:55 AM [facility] DEVICE REMOTES [facility]  09/19/2020  7:55 AM [facility] DEVICE REMOTES [facility]  10/24/2020  7:55 AM [facility] DEVICE REMOTES [facility]  11/28/2020  7:55 AM [facility] DEVICE REMOTES [facility]    No orders of the defined types were placed in this encounter.     -------------------------------

## 2020-03-09 DIAGNOSIS — I83813 Varicose veins of bilateral lower extremities with pain: Secondary | ICD-10-CM | POA: Diagnosis not present

## 2020-03-09 DIAGNOSIS — I8311 Varicose veins of right lower extremity with inflammation: Secondary | ICD-10-CM | POA: Diagnosis not present

## 2020-03-09 DIAGNOSIS — I8312 Varicose veins of left lower extremity with inflammation: Secondary | ICD-10-CM | POA: Diagnosis not present

## 2020-03-16 DIAGNOSIS — I8311 Varicose veins of right lower extremity with inflammation: Secondary | ICD-10-CM | POA: Diagnosis not present

## 2020-03-16 DIAGNOSIS — I83891 Varicose veins of right lower extremities with other complications: Secondary | ICD-10-CM | POA: Diagnosis not present

## 2020-03-22 ENCOUNTER — Ambulatory Visit (INDEPENDENT_AMBULATORY_CARE_PROVIDER_SITE_OTHER): Payer: Medicare Other | Admitting: Family Medicine

## 2020-03-22 ENCOUNTER — Encounter: Payer: Self-pay | Admitting: Family Medicine

## 2020-03-22 ENCOUNTER — Other Ambulatory Visit: Payer: Self-pay

## 2020-03-22 DIAGNOSIS — M25551 Pain in right hip: Secondary | ICD-10-CM

## 2020-03-22 NOTE — Progress Notes (Signed)
Office Visit Note   Patient: April Armstrong           Date of Birth: 1951/11/09           MRN: [id number] Visit Date: 03/22/2020 Requested by: Carol Ada, [place],  [place] [address] PCP: Carol Ada, MD  Subjective: Chief Complaint  Patient presents with  . Right Hip - Pain    Pain lateral hip x 2 weeks. Hurts to bend over to pick up anything.    HPI: She is here with recurrent right hip pain.  2 weeks of pain, no injury.  She did very well after greater trochanter injection and she would like another one.              ROS: No fevers or chills.  All other systems were reviewed and are negative.  Objective: Vital Signs: There were no vitals taken for this visit.  Physical Exam:  General:  Alert and oriented, in no acute distress. Pulm:  Breathing unlabored. Psy:  Normal mood, congruent affect. Skin: No rash. Right hip: No pain with internal hip rotation.  She is point tender over the greater trochanter.  Imaging: No results found.  Assessment & Plan: 1.  Right hip greater trochanter syndrome -Reinject with steroid today.  Home exercises given.  Follow-up as needed.     Procedures: Right hip greater trochanter injection: After sterile prep with Betadine, injected 8 cc 1% lidocaine without epinephrine and 40 mg methylprednisolone into the area of maximum tenderness using a 22-gauge spinal needle.  Complete relief of pain during the anesthetic phase.    PMFS History: Patient Active Problem List   Diagnosis Date Noted  . Trigger finger, left ring finger 03/10/2018  . BPPV (benign paroxysmal positional vertigo) 10/22/2016  . Anxiety state 06/22/2016  . Tachycardia 04/25/2016  . Bilateral occipital neuralgia   . Cryptogenic stroke ([place]) 04/13/2016  . Cerebrovascular accident (CVA) due to thrombosis of right middle cerebral artery ([place]) 04/13/2016  . Cerebral infarction ([place])   . Thyroid activity decreased   . Depression     . Hypothyroidism   . GERD (gastroesophageal reflux disease)   . Hyperlipidemia   . Hypertension   . IBS (irritable bowel syndrome)    Past Medical History:  Diagnosis Date  . Depression   . GERD (gastroesophageal reflux disease)   . Hypercholesterolemia   . Hypertension   . Hypothyroidism   . IBS (irritable bowel syndrome)   . Stroke ([place])   . Thyroid disease     Family History  Problem Relation Age of Onset  . Dementia Mother   . Hypercholesterolemia Mother   . Heart disease Father   . Stroke Father   . Heart disease Brother   . Congestive Heart Failure Sister   . Breast cancer Paternal Aunt     Past Surgical History:  Procedure Laterality Date  . ABDOMINAL HYSTERECTOMY    . APPENDECTOMY    . CESAREAN SECTION    . CHOLECYSTECTOMY    . EP IMPLANTABLE DEVICE N/A 12/28/2016   Procedure: Loop Recorder Insertion;  Surgeon: Evans Lance, MD;  Location: [facility];  Service: Cardiovascular;  Laterality: N/A;  . THYROIDECTOMY    . TRIGGER FINGER RELEASE     Social History   Occupational History  . Not on file  Tobacco Use  . Smoking status: Never Smoker  . Smokeless tobacco: Current User    Types: Chew  Substance and  Sexual Activity  . Alcohol use: No  . Drug use: No  . Sexual activity: Not on file

## 2020-03-22 NOTE — Progress Notes (Signed)
   April Armstrong - 69 y.o. female MRN [id number]  Date of birth: 05-14-1951  Office Visit Note: Visit Date: 03/22/2020 PCP: Carol Ada, MD Referred by: Carol Ada, MD  Subjective: Chief Complaint  Patient presents with  . Right Hip - Pain    Pain lateral hip x 2 weeks. Hurts to bend over to pick up anything.   HPI: April Armstrong is a 69 y.o. female who comes in today with right lateral hip pain for the past two weeks.  She reports that    ROS Otherwise per HPI.  Assessment & Plan: Visit Diagnoses:  1. Pain in right hip     Plan: No additional findings.   Meds & Orders: No orders of the defined types were placed in this encounter.  No orders of the defined types were placed in this encounter.   Follow-up: No follow-ups on file.   Procedures: No procedures performed  No notes on file   Clinical History: No specialty comments available.   She reports that she has never smoked. Her smokeless tobacco use includes chew. No results for input(s): HGBA1C, LABURIC in the last 8760 hours.  Objective:  VS:  HT:    WT:   BMI:     BP:   HR: bpm  TEMP: ( )  RESP:  Physical Exam  Ortho Exam Imaging: No results found.  Past Medical/Family/Surgical/Social History: Medications & Allergies reviewed per EMR, new medications updated. Patient Active Problem List   Diagnosis Date Noted  . Trigger finger, left ring finger 03/10/2018  . BPPV (benign paroxysmal positional vertigo) 10/22/2016  . Anxiety state 06/22/2016  . Tachycardia 04/25/2016  . Bilateral occipital neuralgia   . Cryptogenic stroke ([place]) 04/13/2016  . Cerebrovascular accident (CVA) due to thrombosis of right middle cerebral artery ([place]) 04/13/2016  . Cerebral infarction ([place])   . Thyroid activity decreased   . Depression   . Hypothyroidism   . GERD (gastroesophageal reflux disease)   . Hyperlipidemia   . Hypertension   . IBS (irritable bowel syndrome)    Past Medical History:  Diagnosis  Date  . Depression   . GERD (gastroesophageal reflux disease)   . Hypercholesterolemia   . Hypertension   . Hypothyroidism   . IBS (irritable bowel syndrome)   . Stroke ([place])   . Thyroid disease    Family History  Problem Relation Age of Onset  . Dementia Mother   . Hypercholesterolemia Mother   . Heart disease Father   . Stroke Father   . Heart disease Brother   . Congestive Heart Failure Sister   . Breast cancer Paternal Aunt    Past Surgical History:  Procedure Laterality Date  . ABDOMINAL HYSTERECTOMY    . APPENDECTOMY    . CESAREAN SECTION    . CHOLECYSTECTOMY    . EP IMPLANTABLE DEVICE N/A 12/28/2016   Procedure: Loop Recorder Insertion;  Surgeon: Evans Lance, MD;  Location: [facility];  Service: Cardiovascular;  Laterality: N/A;  . THYROIDECTOMY    . TRIGGER FINGER RELEASE     Social History   Occupational History  . Not on file  Tobacco Use  . Smoking status: Never Smoker  . Smokeless tobacco: Current User    Types: Chew  Substance and Sexual Activity  . Alcohol use: No  . Drug use: No  . Sexual activity: Not on file

## 2020-03-24 LAB — CUP PACEART REMOTE DEVICE CHECK
Date Time Interrogation Session: 20210428230234
Implantable Pulse Generator Implant Date: 20180202

## 2020-04-06 DIAGNOSIS — I83811 Varicose veins of right lower extremities with pain: Secondary | ICD-10-CM | POA: Diagnosis not present

## 2020-04-06 DIAGNOSIS — I8311 Varicose veins of right lower extremity with inflammation: Secondary | ICD-10-CM | POA: Diagnosis not present

## 2020-04-19 ENCOUNTER — Telehealth: Payer: Self-pay | Admitting: Physical Medicine and Rehabilitation

## 2020-04-19 NOTE — Telephone Encounter (Signed)
Patient is requesting an appointment for her right hip pain. She states that the pain is worse with lifting her leg to get in her car. She had a right greater trochanteric bursa injection by Dr. Junius Roads on 4/27. Please advise.

## 2020-04-19 NOTE — Telephone Encounter (Signed)
Ok intraartic

## 2020-04-20 DIAGNOSIS — I83891 Varicose veins of right lower extremities with other complications: Secondary | ICD-10-CM | POA: Diagnosis not present

## 2020-04-20 DIAGNOSIS — I8311 Varicose veins of right lower extremity with inflammation: Secondary | ICD-10-CM | POA: Diagnosis not present

## 2020-04-20 NOTE — Telephone Encounter (Signed)
Called patient to schedule. No answer, and mailbox is full so unable to leave message.

## 2020-04-20 NOTE — Telephone Encounter (Signed)
Scheduled for 05/05/20 at 1430.

## 2020-04-20 NOTE — Telephone Encounter (Signed)
Patient called back, lvm. I returned her call, but "all circuits are busy."

## 2020-04-21 DIAGNOSIS — L820 Inflamed seborrheic keratosis: Secondary | ICD-10-CM | POA: Diagnosis not present

## 2020-04-21 DIAGNOSIS — L570 Actinic keratosis: Secondary | ICD-10-CM | POA: Diagnosis not present

## 2020-04-21 DIAGNOSIS — C44629 Squamous cell carcinoma of skin of left upper limb, including shoulder: Secondary | ICD-10-CM | POA: Diagnosis not present

## 2020-04-21 DIAGNOSIS — D485 Neoplasm of uncertain behavior of skin: Secondary | ICD-10-CM | POA: Diagnosis not present

## 2020-04-25 LAB — CUP PACEART REMOTE DEVICE CHECK
Date Time Interrogation Session: 20210529232220
Implantable Pulse Generator Implant Date: 20180202

## 2020-04-26 ENCOUNTER — Ambulatory Visit (INDEPENDENT_AMBULATORY_CARE_PROVIDER_SITE_OTHER): Payer: Medicare Other | Admitting: *Deleted

## 2020-04-26 DIAGNOSIS — I63311 Cerebral infarction due to thrombosis of right middle cerebral artery: Secondary | ICD-10-CM | POA: Diagnosis not present

## 2020-04-27 NOTE — Progress Notes (Signed)
[facility] Summary Report / Loop Recorder 

## 2020-05-03 DIAGNOSIS — C44629 Squamous cell carcinoma of skin of left upper limb, including shoulder: Secondary | ICD-10-CM | POA: Diagnosis not present

## 2020-05-04 DIAGNOSIS — I8312 Varicose veins of left lower extremity with inflammation: Secondary | ICD-10-CM | POA: Diagnosis not present

## 2020-05-04 DIAGNOSIS — I83812 Varicose veins of left lower extremities with pain: Secondary | ICD-10-CM | POA: Diagnosis not present

## 2020-05-05 ENCOUNTER — Encounter: Payer: Self-pay | Admitting: Physical Medicine and Rehabilitation

## 2020-05-05 ENCOUNTER — Other Ambulatory Visit: Payer: Self-pay

## 2020-05-05 ENCOUNTER — Ambulatory Visit (INDEPENDENT_AMBULATORY_CARE_PROVIDER_SITE_OTHER): Payer: Medicare Other | Admitting: Physical Medicine and Rehabilitation

## 2020-05-05 DIAGNOSIS — G8929 Other chronic pain: Secondary | ICD-10-CM

## 2020-05-05 DIAGNOSIS — G894 Chronic pain syndrome: Secondary | ICD-10-CM

## 2020-05-05 DIAGNOSIS — M545 Low back pain, unspecified: Secondary | ICD-10-CM

## 2020-05-05 DIAGNOSIS — I639 Cerebral infarction, unspecified: Secondary | ICD-10-CM

## 2020-05-05 DIAGNOSIS — M7918 Myalgia, other site: Secondary | ICD-10-CM

## 2020-05-05 DIAGNOSIS — M47816 Spondylosis without myelopathy or radiculopathy, lumbar region: Secondary | ICD-10-CM | POA: Diagnosis not present

## 2020-05-05 DIAGNOSIS — M25551 Pain in right hip: Secondary | ICD-10-CM

## 2020-05-05 NOTE — Progress Notes (Signed)
Pt states pain in the right hip (groin pain). Pt states pain started a month ago. Pt states getting in and out of the car makes pain worse. Injections help with pain.   .Numeric Pain Rating Scale and Functional Assessment Average Pain 9   In the last MONTH (on 0-10 scale) has pain interfered with the following?  1. General activity like being  able to carry out your everyday physical activities such as walking, climbing stairs, carrying groceries, or moving a chair?  Rating(9)    -Dye Allergies.

## 2020-05-12 ENCOUNTER — Encounter: Payer: Self-pay | Admitting: Physical Medicine and Rehabilitation

## 2020-05-12 MED ORDER — TRIAMCINOLONE ACETONIDE 40 MG/ML IJ SUSP
60.0000 mg | INTRAMUSCULAR | Status: AC | PRN
  Administered 2020-05-05: 60 mg via INTRA_ARTICULAR

## 2020-05-12 MED ORDER — BUPIVACAINE HCL 0.25 % IJ SOLN
4.0000 mL | INTRAMUSCULAR | Status: AC | PRN
  Administered 2020-05-05: 4 mL via INTRA_ARTICULAR

## 2020-05-12 NOTE — Progress Notes (Signed)
Beverlyann Broxterman - 69 y.o. female MRN [id number]  Date of birth: 1951/09/28  Office Visit Note: Visit Date: 05/05/2020 PCP: Carol Ada, MD Referred by: Carol Ada, MD  Subjective: No chief complaint on file.  HPI: April Armstrong is a 69 y.o. female who comes in today For evaluation and management of 2 distinct chronic problems with exacerbations both areas.  By way of quick review I have seen Mrs. Rishel over the years mainly for low back pain some lateral hip pain at times.  Her pain at times is been more extreme from a clinical standpoint that he would expect from pathology seen on imaging and exam.  She does not carry a diagnosis of fibromyalgia she does have irritable bowel syndrome and some anxiety depression history.  She comes in today after having seen Dr. Eunice Blase in April and he completed right lateral greater trochanteric injection.  Patient says it did not help very much.  We have completed this in the past with fluoroscopic guidance to the patient's body habitus.  Her biggest complaint today is right lateral hip pain with some referral to the groin.  She endorses 9 out of 10 pain but seems comfortable in the room.  She says this started over 6 weeks ago.  She has a lot of difficulty getting in and out of the car.  She has not had any trauma or falls.  No radicular complaints down the legs.  Prior imaging of the hips have not shown a great deal of arthritis.  She was originally seen and referred to me by Dr. Joni Fears from an orthopedic standpoint he still follows her although she has not seen him recently.  Secondarily she endorses increasing lower back pain worse with standing and ambulating better with sitting.  Again 9 out of 10 pain.  She is status post radiofrequency ablation of L4-5 which was last completed in July of last year.  She has had prior radiofrequency ablations with good relief.  Prior to the ablation she had double diagnostic blocks and physical  therapy and medication management.  MRI findings show mainly facet arthropathy without stenosis.  She has not had any lumbar spine surgery.  She has no red flag symptoms.  Review of Systems  Musculoskeletal: Positive for back pain and joint pain.  All other systems reviewed and are negative.  Otherwise per HPI.  Assessment & Plan: Visit Diagnoses:  1. Pain in right hip   2. Chronic bilateral low back pain without sciatica   3. Spondylosis without myelopathy or radiculopathy, lumbar region   4. Chronic pain syndrome   5. Myofascial pain syndrome     Plan: Findings:  1.  Right hip and groin pain consistent with intra-articular hip pathology.  X-rays have not revealed significant findings of arthritis.  This may be a labral tear.  At this point I think definitive treatment with fluoroscopically guided injection of the hip joint would be appropriate.  If she gets significant relief but it does not last long and would entertain the idea of MRI arthrogram of her hip.  Would also entertain the idea of referral back to Dr. Durward Fortes for evaluation and management.  Trochanteric injection was not very helpful and she is having pain into the groin.  2.  Chronic worsening axial low back pain worse with standing and exam consistent with pain with facet loading is consistent with her usual facet mediated pain.  She has undergone diagnostic blocks in the past and a  double block paradigm she has had physical therapy she has had medication management and time.  She has had prior radiofrequency ablation with the last performed in July with really good relief up until just recently.  No trauma etc.    Meds & Orders: No orders of the defined types were placed in this encounter.   Orders Placed This Encounter  Procedures  . Large Joint Inj    Follow-up: Return in about 4 weeks (around 06/02/2020) for Repeat radiofrequency ablation of the lumbar spine.   Procedures: Large Joint Inj: R hip joint on 05/05/2020  2:33 PM Indications: diagnostic evaluation and pain Details: 22 G 3.5 in needle, fluoroscopy-guided anterior approach  Arthrogram: No  Medications: 4 mL bupivacaine 0.25 %; 60 mg triamcinolone acetonide 40 MG/ML Outcome: tolerated well, no immediate complications  There was excellent flow of contrast producing a partial arthrogram of the hip. The patient did have excellent relief of symptoms during the anesthetic phase of the injection. Procedure, treatment alternatives, risks and benefits explained, specific risks discussed. Consent was given by the patient. Immediately prior to procedure a time out was called to verify the correct patient, procedure, equipment, support staff and site/side marked as required. Patient was prepped and draped in the usual sterile fashion.      No notes on file   Clinical History: MRI LUMBAR SPINE WITHOUT CONTRAST  TECHNIQUE: Multiplanar, multisequence MR imaging of the lumbar spine was performed. No intravenous contrast was administered.  COMPARISON:  None.  FINDINGS: Vertebral body height is maintained. Trace anterolisthesis L4 on L5 due to facet arthropathy is noted. The conus medullaris is normal in signal and position. Imaged intra-abdominal contents are unremarkable.  T11-12 and T12-L1 are imaged in the sagittal plane only. T12-L1 is unremarkable. There is a shallow disc bulge at T11-12 but the central canal and foramina are open.  L1-2:  Negative.  L2-3:  Mild facet degenerative change.  Otherwise negative.  L3-4:  Mild facet degenerative change.  Otherwise negative.  L4-5: Moderately severe facet degenerative change is present. The disc is uncovered without bulging. The central canal is mildly narrowed. Mild to moderate foraminal narrowing is more notable on the left.  L5-S1: There is some facet degenerative change. No disc bulge or protrusion. The central canal and foramina are widely patent.  IMPRESSION: Mild  spondylosis most notable at L4-5 where facet degenerative change results in trace anterolisthesis and there is mild to moderate foraminal narrowing, more notable on the left. The central canal is only mildly narrowed at this level. No nerve root compression is identified.   Electronically Signed   By: Inge Rise M.D.   On: 02/07/2016 13:03   She reports that she has never smoked. Her smokeless tobacco use includes chew. No results for input(s): HGBA1C, LABURIC in the last 8760 hours.  Objective:  VS:  HT:    WT:   BMI:     BP:   HR: bpm  TEMP: ( )  RESP:  Physical Exam Vitals and nursing note reviewed.  Constitutional:      General: She is not in acute distress.    Appearance: Normal appearance. She is well-developed. She is obese. She is not ill-appearing.  HENT:     Head: Normocephalic and atraumatic.  Eyes:     Conjunctiva/sclera: Conjunctivae normal.     Pupils: Pupils are equal, round, and reactive to light.  Cardiovascular:     Rate and Rhythm: Normal rate.     Pulses: Normal pulses.  Pulmonary:     Effort: Pulmonary effort is normal.  Musculoskeletal:     Right lower leg: No edema.     Left lower leg: No edema.     Comments: Patient has some difficulty going sit to stand.  She has concordant low back pain with facet loading and extension.  She is somewhat tender across the lower back PSIS and greater trochanters.  She does have concordant hip pain on the right with internal rotation.  She has good distal strength.  Hip actually has decent range of motion but does seem to give some concordant symptoms at end range of internal rotation.  Skin:    General: Skin is warm and dry.     Findings: No erythema or rash.  Neurological:     General: No focal deficit present.     Mental Status: She is alert and oriented to person, place, and time.     Sensory: No sensory deficit.     Motor: No abnormal muscle tone.     Coordination: Coordination normal.     Gait: Gait  normal.  Psychiatric:        Mood and Affect: Mood normal.        Behavior: Behavior normal.     Ortho Exam  Imaging: No results found.  Past Medical/Family/Surgical/Social History: Medications & Allergies reviewed per EMR, new medications updated. Patient Active Problem List   Diagnosis Date Noted  . Trigger finger, left ring finger 03/10/2018  . BPPV (benign paroxysmal positional vertigo) 10/22/2016  . Anxiety state 06/22/2016  . Tachycardia 04/25/2016  . Bilateral occipital neuralgia   . Cryptogenic stroke ([place]) 04/13/2016  . Cerebrovascular accident (CVA) due to thrombosis of right middle cerebral artery ([place]) 04/13/2016  . Cerebral infarction ([place])   . Thyroid activity decreased   . Depression   . Hypothyroidism   . GERD (gastroesophageal reflux disease)   . Hyperlipidemia   . Hypertension   . IBS (irritable bowel syndrome)    Past Medical History:  Diagnosis Date  . Depression   . GERD (gastroesophageal reflux disease)   . Hypercholesterolemia   . Hypertension   . Hypothyroidism   . IBS (irritable bowel syndrome)   . Stroke ([place])   . Thyroid disease    Family History  Problem Relation Age of Onset  . Dementia Mother   . Hypercholesterolemia Mother   . Heart disease Father   . Stroke Father   . Heart disease Brother   . Congestive Heart Failure Sister   . Breast cancer Paternal Aunt    Past Surgical History:  Procedure Laterality Date  . ABDOMINAL HYSTERECTOMY    . APPENDECTOMY    . CESAREAN SECTION    . CHOLECYSTECTOMY    . EP IMPLANTABLE DEVICE N/A 12/28/2016   Procedure: Loop Recorder Insertion;  Surgeon: Evans Lance, MD;  Location: [facility];  Service: Cardiovascular;  Laterality: N/A;  . THYROIDECTOMY    . TRIGGER FINGER RELEASE     Social History   Occupational History  . Not on file  Tobacco Use  . Smoking status: Never Smoker  . Smokeless tobacco: Current User    Types: Chew  Vaping Use  . Vaping Use: Never used    Substance and Sexual Activity  . Alcohol use: No  . Drug use: No  . Sexual activity: Not on file

## 2020-05-27 ENCOUNTER — Ambulatory Visit (INDEPENDENT_AMBULATORY_CARE_PROVIDER_SITE_OTHER): Payer: Medicare Other | Admitting: *Deleted

## 2020-05-27 DIAGNOSIS — I639 Cerebral infarction, unspecified: Secondary | ICD-10-CM | POA: Diagnosis not present

## 2020-05-27 LAB — CUP PACEART REMOTE DEVICE CHECK
Date Time Interrogation Session: 20210701230939
Implantable Pulse Generator Implant Date: 20180202

## 2020-05-31 NOTE — Progress Notes (Signed)
[facility] Summary Report / Loop Recorder 

## 2020-06-21 DIAGNOSIS — X58XXXA Exposure to other specified factors, initial encounter: Secondary | ICD-10-CM | POA: Diagnosis not present

## 2020-06-21 DIAGNOSIS — S39012A Strain of muscle, fascia and tendon of lower back, initial encounter: Secondary | ICD-10-CM | POA: Diagnosis not present

## 2020-06-21 DIAGNOSIS — M25551 Pain in right hip: Secondary | ICD-10-CM | POA: Diagnosis not present

## 2020-06-29 ENCOUNTER — Other Ambulatory Visit: Payer: Self-pay

## 2020-06-29 ENCOUNTER — Ambulatory Visit: Payer: Self-pay

## 2020-06-29 ENCOUNTER — Ambulatory Visit (INDEPENDENT_AMBULATORY_CARE_PROVIDER_SITE_OTHER): Payer: Medicare Other | Admitting: Physical Medicine and Rehabilitation

## 2020-06-29 ENCOUNTER — Encounter: Payer: Self-pay | Admitting: Physical Medicine and Rehabilitation

## 2020-06-29 VITALS — BP 108/75 | HR 98

## 2020-06-29 DIAGNOSIS — M47816 Spondylosis without myelopathy or radiculopathy, lumbar region: Secondary | ICD-10-CM

## 2020-06-29 MED ORDER — METHYLPREDNISOLONE ACETATE 80 MG/ML IJ SUSP
80.0000 mg | Freq: Once | INTRAMUSCULAR | Status: AC
  Administered 2020-06-29: 80 mg

## 2020-06-29 NOTE — Procedures (Signed)
Lumbar Facet Joint Nerve Denervation  Patient: April Armstrong      Date of Birth: 11/15/1951 MRN: [id number] PCP: Carol Ada, MD      Visit Date: 06/29/2020   Universal Protocol:    Date/Time: 08/04/211:17 PM  Consent Given By: the patient  Position: PRONE  Additional Comments: Vital signs were monitored before and after the procedure. Patient was prepped and draped in the usual sterile fashion. The correct patient, procedure, and site was verified.   Injection Procedure Details:  Procedure Site One Meds Administered:  Meds ordered this encounter  Medications  . methylPREDNISolone acetate (DEPO-MEDROL) injection 80 mg     Laterality: Bilateral  Location/Site:  L4-L5  Needle size: 18 G  Needle type: Radiofrequency cannula  Needle Placement: Along juncture of superior articular process and transverse pocess  Findings:  -Comments:  Procedure Details: For each desired target nerve, the corresponding transverse process (sacral ala for the L5 dorsal rami) was identified and the fluoroscope was positioned to square off the endplates of the corresponding vertebral body to achieve a true AP midline view.  The beam was then obliqued 15 to 20 degrees and caudally tilted 15 to 20 degrees to line up a trajectory along the target nerves. The skin over the target of the junction of superior articulating process and transverse process (sacral ala for the L5 dorsal rami) was infiltrated with 2ml of 1% Lidocaine without Epinephrine.  The 18 gauge 48mm active tip outer cannula was advanced in trajectory view to the target.  This procedure was repeated for each target nerve.  Then, for all levels, the outer cannula placement was fine-tuned and the position was then confirmed with bi-planar imaging.    Test stimulation was done both at sensory and motor levels to ensure there was no radicular stimulation. The target tissues were then infiltrated with 1 ml of 1% Lidocaine without  Epinephrine. Subsequently, a percutaneous neurotomy was carried out for 90 seconds at 80 degrees Celsius.  After the completion of the lesion, 1 ml of injectate was delivered. It was then repeated for each facet joint nerve mentioned above. Appropriate radiographs were obtained to verify the probe placement during the neurotomy.   Additional Comments:  The patient tolerated the procedure well Dressing: 2 x 2 sterile gauze and Band-Aid    Post-procedure details: Patient was observed during the procedure. Post-procedure instructions were reviewed.  Patient left the clinic in stable condition.

## 2020-06-29 NOTE — Progress Notes (Signed)
April Armstrong - 69 y.o. female MRN [id number]  Date of birth: May 30, 1951  Office Visit Note: Visit Date: 06/29/2020 PCP: Carol Ada, MD Referred by: Carol Ada, MD  Subjective: Chief Complaint  Patient presents with  . Lower Back - Pain  . Right Hip - Pain   HPI:  April Armstrong is a 69 y.o. female who comes in today for planned repeat radiofrequency ablation of the Bilateral L4-L5 Lumbar facet joints. This would be ablation of the corresponding medial branches and/or dorsal rami.  Patient has had double diagnostic blocks with more than 70% relief.  Subsequent ablation gave them more than 6 months of over 60% relief.  They have had chronic back pain for quite some time, more than 3 months, which has been an ongoing situation with recalcitrant axial back pain.  They have no radicular pain.  Their axial pain is worse with standing and ambulating and on exam today with facet loading.  They have had physical therapy as well as home exercise program.  The imaging noted in the chart below indicated facet pathology. Accordingly they meet all the criteria and qualification for for radiofrequency ablation and we are going to complete this today hopefully for more longer term relief as part of comprehensive management program.   ROS Otherwise per HPI.  Assessment & Plan: Visit Diagnoses:  1. Spondylosis without myelopathy or radiculopathy, lumbar region     Plan: No additional findings.   Meds & Orders:  Meds ordered this encounter  Medications  . methylPREDNISolone acetate (DEPO-MEDROL) injection 80 mg    Orders Placed This Encounter  Procedures  . Radiofrequency,Lumbar  . XR C-ARM NO REPORT    Follow-up: Return if symptoms worsen or fail to improve.   Procedures: No procedures performed  Lumbar Facet Joint Nerve Denervation  Patient: April Armstrong      Date of Birth: Oct 04, 1951 MRN: [id number] PCP: Carol Ada, MD      Visit Date: 06/29/2020   Universal  Protocol:    Date/Time: 08/04/211:17 PM  Consent Given By: the patient  Position: PRONE  Additional Comments: Vital signs were monitored before and after the procedure. Patient was prepped and draped in the usual sterile fashion. The correct patient, procedure, and site was verified.   Injection Procedure Details:  Procedure Site One Meds Administered:  Meds ordered this encounter  Medications  . methylPREDNISolone acetate (DEPO-MEDROL) injection 80 mg     Laterality: Bilateral  Location/Site:  L4-L5  Needle size: 18 G  Needle type: Radiofrequency cannula  Needle Placement: Along juncture of superior articular process and transverse pocess  Findings:  -Comments:  Procedure Details: For each desired target nerve, the corresponding transverse process (sacral ala for the L5 dorsal rami) was identified and the fluoroscope was positioned to square off the endplates of the corresponding vertebral body to achieve a true AP midline view.  The beam was then obliqued 15 to 20 degrees and caudally tilted 15 to 20 degrees to line up a trajectory along the target nerves. The skin over the target of the junction of superior articulating process and transverse process (sacral ala for the L5 dorsal rami) was infiltrated with 18ml of 1% Lidocaine without Epinephrine.  The 18 gauge 39mm active tip outer cannula was advanced in trajectory view to the target.  This procedure was repeated for each target nerve.  Then, for all levels, the outer cannula placement was fine-tuned and the position was then confirmed with bi-planar imaging.  Test stimulation was done both at sensory and motor levels to ensure there was no radicular stimulation. The target tissues were then infiltrated with 1 ml of 1% Lidocaine without Epinephrine. Subsequently, a percutaneous neurotomy was carried out for 90 seconds at 80 degrees Celsius.  After the completion of the lesion, 1 ml of injectate was delivered. It was then  repeated for each facet joint nerve mentioned above. Appropriate radiographs were obtained to verify the probe placement during the neurotomy.   Additional Comments:  The patient tolerated the procedure well Dressing: 2 x 2 sterile gauze and Band-Aid    Post-procedure details: Patient was observed during the procedure. Post-procedure instructions were reviewed.  Patient left the clinic in stable condition.       Clinical History: MRI LUMBAR SPINE WITHOUT CONTRAST  TECHNIQUE: Multiplanar, multisequence MR imaging of the lumbar spine was performed. No intravenous contrast was administered.  COMPARISON:  None.  FINDINGS: Vertebral body height is maintained. Trace anterolisthesis L4 on L5 due to facet arthropathy is noted. The conus medullaris is normal in signal and position. Imaged intra-abdominal contents are unremarkable.  T11-12 and T12-L1 are imaged in the sagittal plane only. T12-L1 is unremarkable. There is a shallow disc bulge at T11-12 but the central canal and foramina are open.  L1-2:  Negative.  L2-3:  Mild facet degenerative change.  Otherwise negative.  L3-4:  Mild facet degenerative change.  Otherwise negative.  L4-5: Moderately severe facet degenerative change is present. The disc is uncovered without bulging. The central canal is mildly narrowed. Mild to moderate foraminal narrowing is more notable on the left.  L5-S1: There is some facet degenerative change. No disc bulge or protrusion. The central canal and foramina are widely patent.  IMPRESSION: Mild spondylosis most notable at L4-5 where facet degenerative change results in trace anterolisthesis and there is mild to moderate foraminal narrowing, more notable on the left. The central canal is only mildly narrowed at this level. No nerve root compression is identified.   Electronically Signed   By: Inge Rise M.D.   On: 02/07/2016 13:03     Objective:  VS:  HT:     WT:   BMI:     BP:108/75  HR:98bpm  TEMP: ( )  RESP:  Physical Exam Constitutional:      General: She is not in acute distress.    Appearance: Normal appearance. She is not ill-appearing.  HENT:     Head: Normocephalic and atraumatic.     Right Ear: External ear normal.     Left Ear: External ear normal.  Eyes:     Extraocular Movements: Extraocular movements intact.  Cardiovascular:     Rate and Rhythm: Normal rate.     Pulses: Normal pulses.  Musculoskeletal:     Right lower leg: No edema.     Left lower leg: No edema.     Comments: Patient has good distal strength with no pain over the greater trochanters.  Patient somewhat slow to rise from a seated position to full extension.  There is concordant low back pain with facet loading and lumbar spine extension rotation.  There are no definitive trigger points but the patient is somewhat tender across the lower back and PSIS.  There is no pain with hip rotation.  Skin:    Findings: No erythema, lesion or rash.  Neurological:     General: No focal deficit present.     Mental Status: She is alert and oriented to person, place, and  time.     Sensory: No sensory deficit.     Motor: No weakness or abnormal muscle tone.     Coordination: Coordination normal.  Psychiatric:        Mood and Affect: Mood normal.        Behavior: Behavior normal.      Imaging: No results found.

## 2020-06-29 NOTE — Progress Notes (Signed)
Pt states right lower back pain. Pt states makes up her bed and washing dish the pain get worse. Pt states she has to sit down to relief the pain. Pt has hx of inj on 05/05/20 that it was fine.  Numeric Pain Rating Scale and Functional Assessment Average Pain 10   In the last MONTH (on 0-10 scale) has pain interfered with the following?  1. General activity like being  able to carry out your everyday physical activities such as walking, climbing stairs, carrying groceries, or moving a chair?  Rating(5)   +Driver, -BT, -Dye Allergies.

## 2020-06-30 LAB — CUP PACEART REMOTE DEVICE CHECK
Date Time Interrogation Session: 20210803231557
Implantable Pulse Generator Implant Date: 20180202

## 2020-07-04 ENCOUNTER — Ambulatory Visit (INDEPENDENT_AMBULATORY_CARE_PROVIDER_SITE_OTHER): Payer: Medicare Other | Admitting: *Deleted

## 2020-07-04 DIAGNOSIS — I10 Essential (primary) hypertension: Secondary | ICD-10-CM | POA: Diagnosis not present

## 2020-07-04 DIAGNOSIS — R7301 Impaired fasting glucose: Secondary | ICD-10-CM | POA: Diagnosis not present

## 2020-07-04 DIAGNOSIS — I63311 Cerebral infarction due to thrombosis of right middle cerebral artery: Secondary | ICD-10-CM | POA: Diagnosis not present

## 2020-07-04 DIAGNOSIS — E2839 Other primary ovarian failure: Secondary | ICD-10-CM | POA: Diagnosis not present

## 2020-07-04 DIAGNOSIS — R05 Cough: Secondary | ICD-10-CM | POA: Diagnosis not present

## 2020-07-04 DIAGNOSIS — G8929 Other chronic pain: Secondary | ICD-10-CM | POA: Diagnosis not present

## 2020-07-04 DIAGNOSIS — I639 Cerebral infarction, unspecified: Secondary | ICD-10-CM | POA: Diagnosis not present

## 2020-07-04 DIAGNOSIS — F329 Major depressive disorder, single episode, unspecified: Secondary | ICD-10-CM | POA: Diagnosis not present

## 2020-07-04 DIAGNOSIS — E039 Hypothyroidism, unspecified: Secondary | ICD-10-CM | POA: Diagnosis not present

## 2020-07-04 DIAGNOSIS — M7061 Trochanteric bursitis, right hip: Secondary | ICD-10-CM | POA: Diagnosis not present

## 2020-07-04 DIAGNOSIS — K219 Gastro-esophageal reflux disease without esophagitis: Secondary | ICD-10-CM | POA: Diagnosis not present

## 2020-07-04 DIAGNOSIS — M25551 Pain in right hip: Secondary | ICD-10-CM | POA: Diagnosis not present

## 2020-07-04 DIAGNOSIS — M545 Low back pain: Secondary | ICD-10-CM | POA: Diagnosis not present

## 2020-07-04 DIAGNOSIS — E782 Mixed hyperlipidemia: Secondary | ICD-10-CM | POA: Diagnosis not present

## 2020-07-04 NOTE — Progress Notes (Signed)
[facility] Summary Report / Loop Recorder 

## 2020-07-11 ENCOUNTER — Other Ambulatory Visit: Payer: Self-pay | Admitting: Physician Assistant

## 2020-07-11 DIAGNOSIS — Z1231 Encounter for screening mammogram for malignant neoplasm of breast: Secondary | ICD-10-CM

## 2020-07-14 ENCOUNTER — Ambulatory Visit
Admission: RE | Admit: 2020-07-14 | Discharge: 2020-07-14 | Disposition: A | Discharge status: Home / Self Care | Payer: Medicare Other | Source: Ambulatory Visit | Attending: Physician Assistant | Admitting: Physician Assistant

## 2020-07-14 ENCOUNTER — Other Ambulatory Visit: Payer: Self-pay

## 2020-07-14 DIAGNOSIS — Z1231 Encounter for screening mammogram for malignant neoplasm of breast: Secondary | ICD-10-CM | POA: Diagnosis not present

## 2020-07-19 DIAGNOSIS — M25551 Pain in right hip: Secondary | ICD-10-CM | POA: Diagnosis not present

## 2020-07-21 DIAGNOSIS — Z6839 Body mass index (BMI) 39.0-39.9, adult: Secondary | ICD-10-CM | POA: Diagnosis not present

## 2020-07-21 DIAGNOSIS — I639 Cerebral infarction, unspecified: Secondary | ICD-10-CM | POA: Diagnosis not present

## 2020-07-21 DIAGNOSIS — E782 Mixed hyperlipidemia: Secondary | ICD-10-CM | POA: Diagnosis not present

## 2020-07-21 DIAGNOSIS — I10 Essential (primary) hypertension: Secondary | ICD-10-CM | POA: Diagnosis not present

## 2020-07-21 DIAGNOSIS — K589 Irritable bowel syndrome without diarrhea: Secondary | ICD-10-CM | POA: Diagnosis not present

## 2020-07-21 DIAGNOSIS — E039 Hypothyroidism, unspecified: Secondary | ICD-10-CM | POA: Diagnosis not present

## 2020-07-21 DIAGNOSIS — M5481 Occipital neuralgia: Secondary | ICD-10-CM | POA: Diagnosis not present

## 2020-07-21 DIAGNOSIS — R000 Tachycardia, unspecified: Secondary | ICD-10-CM | POA: Diagnosis not present

## 2020-07-21 DIAGNOSIS — K219 Gastro-esophageal reflux disease without esophagitis: Secondary | ICD-10-CM | POA: Diagnosis not present

## 2020-07-21 DIAGNOSIS — I63311 Cerebral infarction due to thrombosis of right middle cerebral artery: Secondary | ICD-10-CM | POA: Diagnosis not present

## 2020-07-21 DIAGNOSIS — E669 Obesity, unspecified: Secondary | ICD-10-CM | POA: Diagnosis not present

## 2020-07-21 DIAGNOSIS — R7303 Prediabetes: Secondary | ICD-10-CM | POA: Diagnosis not present

## 2020-07-26 DIAGNOSIS — M7061 Trochanteric bursitis, right hip: Secondary | ICD-10-CM | POA: Diagnosis not present

## 2020-07-26 DIAGNOSIS — M25551 Pain in right hip: Secondary | ICD-10-CM | POA: Diagnosis not present

## 2020-08-08 ENCOUNTER — Ambulatory Visit (INDEPENDENT_AMBULATORY_CARE_PROVIDER_SITE_OTHER): Payer: Medicare Other | Admitting: *Deleted

## 2020-08-08 DIAGNOSIS — K580 Irritable bowel syndrome with diarrhea: Secondary | ICD-10-CM | POA: Diagnosis not present

## 2020-08-08 DIAGNOSIS — Z86010 Personal history of colonic polyps: Secondary | ICD-10-CM | POA: Diagnosis not present

## 2020-08-08 DIAGNOSIS — I639 Cerebral infarction, unspecified: Secondary | ICD-10-CM

## 2020-08-08 DIAGNOSIS — M549 Dorsalgia, unspecified: Secondary | ICD-10-CM | POA: Diagnosis not present

## 2020-08-08 LAB — CUP PACEART REMOTE DEVICE CHECK
Date Time Interrogation Session: 20210905233516
Implantable Pulse Generator Implant Date: 20180202

## 2020-08-10 DIAGNOSIS — M25551 Pain in right hip: Secondary | ICD-10-CM | POA: Diagnosis not present

## 2020-08-10 NOTE — Progress Notes (Signed)
[facility] Summary Report / Loop Recorder 

## 2020-08-30 DIAGNOSIS — Z86010 Personal history of colonic polyps: Secondary | ICD-10-CM | POA: Diagnosis not present

## 2020-08-30 DIAGNOSIS — K635 Polyp of colon: Secondary | ICD-10-CM | POA: Diagnosis not present

## 2020-08-30 DIAGNOSIS — D123 Benign neoplasm of transverse colon: Secondary | ICD-10-CM | POA: Diagnosis not present

## 2020-09-03 LAB — CUP PACEART REMOTE DEVICE CHECK
Date Time Interrogation Session: 20211008233759
Implantable Pulse Generator Implant Date: 20180202

## 2020-09-05 DIAGNOSIS — R7301 Impaired fasting glucose: Secondary | ICD-10-CM | POA: Diagnosis not present

## 2020-09-05 DIAGNOSIS — I10 Essential (primary) hypertension: Secondary | ICD-10-CM | POA: Diagnosis not present

## 2020-09-05 DIAGNOSIS — E039 Hypothyroidism, unspecified: Secondary | ICD-10-CM | POA: Diagnosis not present

## 2020-09-06 ENCOUNTER — Ambulatory Visit: Payer: Medicare Other | Admitting: Adult Health

## 2020-09-06 ENCOUNTER — Other Ambulatory Visit: Payer: Self-pay | Admitting: Physician Assistant

## 2020-09-06 ENCOUNTER — Other Ambulatory Visit: Payer: Self-pay

## 2020-09-06 ENCOUNTER — Other Ambulatory Visit: Payer: Self-pay | Admitting: Adult Health

## 2020-09-06 ENCOUNTER — Telehealth: Payer: Self-pay | Admitting: Adult Health

## 2020-09-06 DIAGNOSIS — G4700 Insomnia, unspecified: Secondary | ICD-10-CM

## 2020-09-06 DIAGNOSIS — F331 Major depressive disorder, recurrent, moderate: Secondary | ICD-10-CM

## 2020-09-06 DIAGNOSIS — E2839 Other primary ovarian failure: Secondary | ICD-10-CM

## 2020-09-06 DIAGNOSIS — F411 Generalized anxiety disorder: Secondary | ICD-10-CM

## 2020-09-06 MED ORDER — LAMOTRIGINE 100 MG PO TABS: 100.0000 mg | ORAL_TABLET | Freq: Every day | ORAL | 5 refills | Status: DC

## 2020-09-06 MED ORDER — SERTRALINE HCL 100 MG PO TABS: 100.0000 mg | ORAL_TABLET | Freq: Every day | ORAL | 5 refills | Status: DC

## 2020-09-06 MED ORDER — BUPROPION HCL ER (XL) 300 MG PO TB24: 300.0000 mg | ORAL_TABLET | Freq: Every day | ORAL | 5 refills | Status: DC

## 2020-09-06 MED ORDER — TRAZODONE HCL 150 MG PO TABS: 75.0000 mg | ORAL_TABLET | Freq: Every day | ORAL | 5 refills | Status: DC

## 2020-09-06 NOTE — Telephone Encounter (Signed)
Noted thank you

## 2020-09-06 NOTE — Telephone Encounter (Signed)
I sent them in for her.

## 2020-09-06 NOTE — Telephone Encounter (Signed)
Pt had apt scheduled today. Came in too late had to RS. Apt 10/13 & 9:40 Virtual phone apt. Requesting refills on all meds. She is out. Wellbutrin 300 mg Lamotrigine 100 mg Sertraline 100 mg and Trazodone 75 mg. @ [facility] on chart. Contact # [phone] for Pt Please advise pt when sent to pharmacy. She was a bit frazzeled

## 2020-09-06 NOTE — Telephone Encounter (Signed)
She is scheduled tomorrow now, do you want me to send medication today?

## 2020-09-07 ENCOUNTER — Telehealth (INDEPENDENT_AMBULATORY_CARE_PROVIDER_SITE_OTHER): Payer: Medicare Other | Admitting: Adult Health

## 2020-09-07 ENCOUNTER — Encounter: Payer: Self-pay | Admitting: Adult Health

## 2020-09-07 DIAGNOSIS — G4700 Insomnia, unspecified: Secondary | ICD-10-CM

## 2020-09-07 DIAGNOSIS — F410 Panic disorder [episodic paroxysmal anxiety] without agoraphobia: Secondary | ICD-10-CM

## 2020-09-07 DIAGNOSIS — F331 Major depressive disorder, recurrent, moderate: Secondary | ICD-10-CM | POA: Diagnosis not present

## 2020-09-07 DIAGNOSIS — F411 Generalized anxiety disorder: Secondary | ICD-10-CM

## 2020-09-07 NOTE — Progress Notes (Signed)
April Armstrong [id number] [birth year] 69 y.o.  Virtual Visit via Telephone Note  I connected with pt on 09/07/20 at  9:40 AM EDT by telephone and verified that I am speaking with the correct person using two identifiers.   I discussed the limitations, risks, security and privacy concerns of performing an evaluation and management service by telephone and the availability of in person appointments. I also discussed with the patient that there may be a patient responsible charge related to this service. The patient expressed understanding and agreed to proceed.   I discussed the assessment and treatment plan with the patient. The patient was provided an opportunity to ask questions and all were answered. The patient agreed with the plan and demonstrated an understanding of the instructions.   The patient was advised to call back or seek an in-person evaluation if the symptoms worsen or if the condition fails to improve as anticipated.  I provided 30 minutes of non-face-to-face time during this encounter.  The patient was located at home.  The provider was located at [place].   April Gell, NP   Subjective:   Patient ID:  April Armstrong is a 69 y.o. (DOB Apr 02, 1951) female.  Chief Complaint: No chief complaint on file.   HPI April Armstrong presents for follow-up of MDD, GAD, insomnia, and panic attacks.   Describes mood today as "ok". Pleasant. Mood symptoms - denies depression, anxiety, and irritability. Denies panic attacks. Stating "I'm doing fine". She and husband doing well. Building a Youth worker. Stable interest and motivation. Taking medications as prescribed.  Energy levels stable. Active, has a regular exercise routine.  Enjoys some usual interests and activities. Married. Lives with husband of 13 years and 3 dogs. Has 3 daughters and 8 grandchildren. Appetite adequate. Weight stable. Sleeps well most nights. Averages 5 hours. Focus and  concentration stable. Completing tasks. Managing aspects of household.  Denies SI or HI. Denies AH or VH. Recent colonoscopy and bone density.   Review of Systems:  Review of Systems  Musculoskeletal: Negative for gait problem.  Neurological: Negative for tremors.  Psychiatric/Behavioral:       Please refer to HPI    Medications: I have reviewed the patient's current medications.  Current Outpatient Medications  Medication Sig Dispense Refill   aspirin 325 MG tablet Take 1 tablet (325 mg total) by mouth daily.     atorvastatin (LIPITOR) 80 MG tablet Take 1 tablet (80 mg total) by mouth at bedtime. 30 tablet 0   buPROPion (WELLBUTRIN XL) 300 MG 24 hr tablet Take 1 tablet (300 mg total) by mouth daily. 30 tablet 5   Dextromethorphan Polistirex (ROBITUSSIN 12 HOUR COUGH PO) Take by mouth as needed.     diclofenac sodium (VOLTAREN) 1 % GEL APPLY 2-4 GRAMS TO LARGE JOINT AREA UP TO FOUR TIMES A DAY AS NEEDED 2 Tube 3   lamoTRIgine (LAMICTAL) 100 MG tablet Take 1 tablet (100 mg total) by mouth daily. 30 tablet 5   levothyroxine (SYNTHROID) 100 MCG tablet Take 100 mcg by mouth daily.     loratadine (CLARITIN) 10 MG tablet Take 10 mg by mouth daily.     metoprolol succinate (TOPROL-XL) 25 MG 24 hr tablet Take 25 mg by mouth daily.      omeprazole (PRILOSEC) 40 MG capsule Take 40 mg by mouth daily.      RESTASIS 0.05 % ophthalmic emulsion Place 1 drop into both eyes 2 (two) times daily.  sertraline (ZOLOFT) 100 MG tablet Take 1 tablet (100 mg total) by mouth daily. 30 tablet 5   spironolactone (ALDACTONE) 25 MG tablet Take 25 mg by mouth daily.      traZODone (DESYREL) 150 MG tablet Take 0.5 tablets (75 mg total) by mouth at bedtime. 30 tablet 5   vitamin B-12 (CYANOCOBALAMIN) 100 MCG tablet Take 100 mcg by mouth at bedtime.     No current facility-administered medications for this visit.    Medication Side Effects: None  Allergies: No Known Allergies  Past Medical  History:  Diagnosis Date   Depression    GERD (gastroesophageal reflux disease)    Hypercholesterolemia    Hypertension    Hypothyroidism    IBS (irritable bowel syndrome)    Stroke ([place])    Thyroid disease     Family History  Problem Relation Age of Onset   Dementia Mother    Hypercholesterolemia Mother    Heart disease Father    Stroke Father    Heart disease Brother    Congestive Heart Failure Sister    Breast cancer Paternal Aunt     Social History   Socioeconomic History   Marital status: Married    Spouse name: Not on file   Number of children: Not on file   Years of education: Not on file   Highest education level: Not on file  Occupational History   Not on file  Tobacco Use   Smoking status: Never Smoker   Smokeless tobacco: Current User    Types: Chew  Vaping Use   Vaping Use: Never used  Substance and Sexual Activity   Alcohol use: No   Drug use: No   Sexual activity: Not on file  Other Topics Concern   Not on file  Social History Narrative   Not on file   Social Determinants of Health   Financial Resource Strain:    Difficulty of Paying Living Expenses: Not on file  Food Insecurity:    Worried About Running Out of Food in the Last Year: Not on file   [employer] of Food in the Last Year: Not on file  Transportation Needs:    Lack of Transportation (Medical): Not on file   Lack of Transportation (Non-Medical): Not on file  Physical Activity:    Days of Exercise per Week: Not on file   Minutes of Exercise per Session: Not on file  Stress:    Feeling of Stress : Not on file  Social Connections:    Frequency of Communication with Friends and Family: Not on file   Frequency of Social Gatherings with Friends and Family: Not on file   Attends Religious Services: Not on file   Active Member of Clubs or Organizations: Not on file   Attends Archivist Meetings: Not on file   Marital Status: Not on  file  Intimate Partner Violence:    Fear of Current or Ex-Partner: Not on file   Emotionally Abused: Not on file   Physically Abused: Not on file   Sexually Abused: Not on file    Past Medical History, Surgical history, Social history, and Family history were reviewed and updated as appropriate.   Please see review of systems for further details on the patient's review from today.   Objective:   Physical Exam:  There were no vitals taken for this visit.  Physical Exam Constitutional:      General: She is not in acute distress. Musculoskeletal:  General: No deformity.  Neurological:     Mental Status: She is alert and oriented to person, place, and time.     Coordination: Coordination normal.  Psychiatric:        Attention and Perception: Attention and perception normal. She does not perceive auditory or visual hallucinations.        Mood and Affect: Mood normal. Mood is not anxious or depressed. Affect is not labile, blunt, angry or inappropriate.        Speech: Speech normal.        Behavior: Behavior normal.        Thought Content: Thought content normal. Thought content is not paranoid or delusional. Thought content does not include homicidal or suicidal ideation. Thought content does not include homicidal or suicidal plan.        Cognition and Memory: Cognition and memory normal.        Judgment: Judgment normal.     Comments: Insight intact     Lab Review:     Component Value Date/Time   NA 140 04/12/2016 1925   K 4.0 04/12/2016 1925   CL 105 04/12/2016 1925   CO2 28 04/12/2016 1925   GLUCOSE 97 04/12/2016 1925   BUN 21 (H) 04/12/2016 1925   CREATININE 0.77 04/12/2016 1925   CALCIUM 9.4 04/12/2016 1925   GFRNONAA >60 04/12/2016 1925   GFRAA >60 04/12/2016 1925       Component Value Date/Time   WBC 6.4 10/03/2018 1707   RBC 4.48 10/03/2018 1707   HGB 13.3 10/03/2018 1707   HCT 42.7 10/03/2018 1707   PLT 293 10/03/2018 1707   MCV 95.3 10/03/2018  1707   MCH 29.7 10/03/2018 1707   MCHC 31.1 10/03/2018 1707   RDW 12.7 10/03/2018 1707    No results found for: POCLITH, LITHIUM   No results found for: PHENYTOIN, PHENOBARB, VALPROATE, CBMZ   .res Assessment: Plan:    Plan:  1. Lamictal 100mg  daily 2. Wellbutrin XL 300mg  daily 3. Zoloft 100mg  daily 4. Trazadone 150mg  daily - taking 75mg  at hs  RTC 3 months  Patient advised to contact office with any questions, adverse effects, or acute worsening in signs and symptoms.  Counseled patient regarding potential benefits, risks, and side effects of Lamictal to include potential risk of Stevens-Johnson syndrome. Advised patient to stop taking Lamictal and contact office immediately if rash develops and to seek urgent medical attention if rash is severe and/or spreading quickly.  Discussed potential benefits, risk, and side effects of benzodiazepines to include potential risk of tolerance and dependence, as well as possible drowsiness.  Advised patient not to drive if experiencing drowsiness and to take lowest possible effective dose to minimize risk of dependence and tolerance.   Diagnoses and all orders for this visit:  Generalized anxiety disorder  Major depressive disorder, recurrent episode, moderate (HCC)  Insomnia, unspecified type  Panic attacks    Please see After Visit Summary for patient specific instructions.  Future Appointments  Date Time Provider [place]  09/12/2020  9:05 AM [facility] DEVICE REMOTES [facility]  10/17/2020  9:05 AM [facility] DEVICE REMOTES [facility]  11/21/2020  9:05 AM [facility] DEVICE REMOTES [facility]    No orders of the defined types were placed in this encounter.     -------------------------------

## 2020-09-12 ENCOUNTER — Ambulatory Visit (INDEPENDENT_AMBULATORY_CARE_PROVIDER_SITE_OTHER): Payer: Medicare Other

## 2020-09-12 DIAGNOSIS — I639 Cerebral infarction, unspecified: Secondary | ICD-10-CM

## 2020-09-15 NOTE — Progress Notes (Signed)
[facility] Summary Report / Loop Recorder 

## 2020-09-22 DIAGNOSIS — L570 Actinic keratosis: Secondary | ICD-10-CM | POA: Diagnosis not present

## 2020-09-22 DIAGNOSIS — D485 Neoplasm of uncertain behavior of skin: Secondary | ICD-10-CM | POA: Diagnosis not present

## 2020-09-22 DIAGNOSIS — C44729 Squamous cell carcinoma of skin of left lower limb, including hip: Secondary | ICD-10-CM | POA: Diagnosis not present

## 2020-10-03 DIAGNOSIS — M79651 Pain in right thigh: Secondary | ICD-10-CM | POA: Diagnosis not present

## 2020-10-03 DIAGNOSIS — M79604 Pain in right leg: Secondary | ICD-10-CM | POA: Diagnosis not present

## 2020-10-04 ENCOUNTER — Telehealth: Payer: Self-pay | Admitting: Physical Medicine and Rehabilitation

## 2020-10-04 NOTE — Telephone Encounter (Signed)
Patient called. She would like an appointment with Dr. Ernestina Patches. Her call back number is [phone]

## 2020-10-05 NOTE — Telephone Encounter (Signed)
Patient states that she is having thigh pain. She states that she saw an orthopedic surgeon for this pain and had a hip injection which helped for a short time. She wants to discuss with Dr. Ernestina Patches. Scheduled for an OV on 11/23 at 1030.

## 2020-10-18 ENCOUNTER — Other Ambulatory Visit: Payer: Self-pay

## 2020-10-18 ENCOUNTER — Ambulatory Visit (INDEPENDENT_AMBULATORY_CARE_PROVIDER_SITE_OTHER): Payer: Medicare Other | Admitting: Physical Medicine and Rehabilitation

## 2020-10-18 VITALS — BP 111/72 | HR 63

## 2020-10-18 DIAGNOSIS — M5416 Radiculopathy, lumbar region: Secondary | ICD-10-CM | POA: Diagnosis not present

## 2020-10-18 DIAGNOSIS — M25551 Pain in right hip: Secondary | ICD-10-CM

## 2020-10-18 DIAGNOSIS — M47816 Spondylosis without myelopathy or radiculopathy, lumbar region: Secondary | ICD-10-CM

## 2020-10-18 DIAGNOSIS — I639 Cerebral infarction, unspecified: Secondary | ICD-10-CM

## 2020-10-18 NOTE — Progress Notes (Signed)
Right hip pain. States that Dr. Ninfa Linden could not see her fast enough. Had a shot that really helped for about a week. States ortho told her something was torn, but did not tell her what to do other than the injection. She wants to see someone in our office. States pain is in lateral hip and sometimes inner thigh. Numeric Pain Rating Scale and Functional Assessment Average Pain 9   In the last MONTH (on 0-10 scale) has pain interfered with the following?  1. General activity like being  able to carry out your everyday physical activities such as walking, climbing stairs, carrying groceries, or moving a chair?  Rating(9)

## 2020-10-26 ENCOUNTER — Ambulatory Visit: Payer: Self-pay

## 2020-10-26 ENCOUNTER — Ambulatory Visit (INDEPENDENT_AMBULATORY_CARE_PROVIDER_SITE_OTHER): Payer: Medicare Other | Admitting: Orthopaedic Surgery

## 2020-10-26 ENCOUNTER — Other Ambulatory Visit: Payer: Self-pay

## 2020-10-26 ENCOUNTER — Encounter: Payer: Self-pay | Admitting: Orthopaedic Surgery

## 2020-10-26 VITALS — Ht 62.75 in | Wt 215.0 lb

## 2020-10-26 DIAGNOSIS — M7061 Trochanteric bursitis, right hip: Secondary | ICD-10-CM

## 2020-10-26 DIAGNOSIS — I639 Cerebral infarction, unspecified: Secondary | ICD-10-CM

## 2020-10-26 DIAGNOSIS — M5441 Lumbago with sciatica, right side: Secondary | ICD-10-CM | POA: Diagnosis not present

## 2020-10-26 DIAGNOSIS — M1611 Unilateral primary osteoarthritis, right hip: Secondary | ICD-10-CM

## 2020-10-26 DIAGNOSIS — G8929 Other chronic pain: Secondary | ICD-10-CM

## 2020-10-26 DIAGNOSIS — M25551 Pain in right hip: Secondary | ICD-10-CM | POA: Diagnosis not present

## 2020-10-26 MED ORDER — METHYLPREDNISOLONE ACETATE 40 MG/ML IJ SUSP
40.0000 mg | INTRAMUSCULAR | Status: AC | PRN
  Administered 2020-10-26: 40 mg via INTRA_ARTICULAR

## 2020-10-26 MED ORDER — LIDOCAINE HCL 1 % IJ SOLN
3.0000 mL | INTRAMUSCULAR | Status: AC | PRN
  Administered 2020-10-26: 3 mL

## 2020-10-26 NOTE — Progress Notes (Signed)
Office Visit Note   Patient: April Armstrong           Date of Birth: Apr 05, 1951           MRN: [id number] Visit Date: 10/26/2020              Requested by: Carol Ada, [place],  [place] [address] PCP: Carol Ada, MD   Assessment & Plan: Visit Diagnoses:  1. Pain in right hip   2. Unilateral primary osteoarthritis, right hip   3. Trochanteric bursitis, right hip   4. Chronic right-sided low back pain with right-sided sciatica     Plan: Based on her MRI findings she certainly has reason for hip to hurt.  Right now do not feel that she needs a hip replacement.  I did provide a steroid injection of the trochanteric area and would like to obtain an MRI of her lumbar spine to rule out any sciatic impingement based on her clinical exam.  She may even benefit from an injection of the right ischium but I would like to see her back after MRI of the lumbar spine to see if there is any evidence of nerve impingement as well.  I went over a hip model and explained in detail what is going on.  Right now do not feel that she needs a hip replacement.  I would like to see her back in 3 weeks after we have MRI of the lumbar spine.  All question concerns were answered and addressed.  Follow-Up Instructions: Return in about 3 weeks (around 11/16/2020).   Orders:  Orders Placed This Encounter  Procedures  . Large Joint Inj   No orders of the defined types were placed in this encounter.     Procedures: Large Joint Inj: R greater trochanter on 10/26/2020 3:37 PM Indications: pain and diagnostic evaluation Details: 22 G 1.5 in needle, lateral approach  Arthrogram: No  Medications: 3 mL lidocaine 1 %; 40 mg methylPREDNISolone acetate 40 MG/ML Outcome: tolerated well, no immediate complications Procedure, treatment alternatives, risks and benefits explained, specific risks discussed. Consent was given by the patient. Immediately prior to procedure a time out  was called to verify the correct patient, procedure, equipment, support staff and site/side marked as required. Patient was prepped and draped in the usual sterile fashion.       Clinical Data: No additional findings.   Subjective: Chief Complaint  Patient presents with  . Right Hip - Pain  The patient is I am seeing for the first time.  She has been established patient of one of my partners before.  She comes in with right hip pain and some bothering her for many months now.  She actually has had MRI recently of her right hip area.  She reports sciatic type of pain and ischial pain as well as trochanteric pain on the right side.  There has been a slight right groin pain.  She is a borderline diabetic.  She is 69 years old.  She has a BMI of 38.  She does not have any history of injury to that right hip.  She has had steroid injections by Dr. Ernestina Patches in her lumbar spine in the past.  This was based off an old MRI from 2017.  HPI  Review of Systems She currently denies any headache, chest pain, shortness of breath, fever, chills, nausea, vomiting  Objective: Vital Signs: Ht 5' 2.75" (1.594 m)   Wt 215 lb (  97.5 kg)   BMI 38.39 kg/m   Physical Exam Ortho Exam She is alert and orient x3 and in no acute distress.  Examination of her right hip shows fluid and full range of motion of that hip and there is some pain in the groin.  There is significant pain to palpation over the ischium and the sciatic region as well as the trochanteric area on the right side.  There is no leg length discrepancy. Specialty Comments:  No specialty comments available.  Imaging: No results found. No plain films were obtained of her hips today but there is an MRI report that comes with her in the MRI.  It does show degenerative tearing of the acetabular labrum.  There is some minimal subarticular cyst formation in the roof of the acetabulum.  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  There is edema in the gluteus minimus tendon but no full tear.  There are some trochanteric bursitis and inflammation and some mild edema in the iliopsoas tendon as well as partial tearing of the common semitendinosus tendon at the ischial tuberosity.  PMFS History: Patient Active Problem List   Diagnosis Date Noted  . Trigger finger, left ring finger 03/10/2018  . BPPV (benign paroxysmal positional vertigo) 10/22/2016  . Anxiety state 06/22/2016  . Tachycardia 04/25/2016  . Bilateral occipital neuralgia   . Cryptogenic stroke ([place]) 04/13/2016  . Cerebrovascular accident (CVA) due to thrombosis of right middle cerebral artery ([place]) 04/13/2016  . Cerebral infarction ([place])   . Thyroid activity decreased   . Depression   . Hypothyroidism   . GERD (gastroesophageal reflux disease)   . Hyperlipidemia   . Hypertension   . IBS (irritable bowel syndrome)    Past Medical History:  Diagnosis Date  . Depression   . GERD (gastroesophageal reflux disease)   . Hypercholesterolemia   . Hypertension   . Hypothyroidism   . IBS (irritable bowel syndrome)   . Stroke ([place])   . Thyroid disease     Family History  Problem Relation Age of Onset  . Dementia Mother   . Hypercholesterolemia Mother   . Heart disease Father   . Stroke Father   . Heart disease Brother   . Congestive Heart Failure Sister   . Breast cancer Paternal Aunt     Past Surgical History:  Procedure Laterality Date  . ABDOMINAL HYSTERECTOMY    . APPENDECTOMY    . CESAREAN SECTION    . CHOLECYSTECTOMY    . EP IMPLANTABLE DEVICE N/A 12/28/2016   Procedure: Loop Recorder Insertion;  Surgeon: Evans Lance, MD;  Location: [facility];  Service: Cardiovascular;  Laterality: N/A;  . THYROIDECTOMY    . TRIGGER FINGER RELEASE     Social History   Occupational History  . Not on file  Tobacco Use  . Smoking status: Never Smoker  . Smokeless tobacco: Current User    Types: Chew  Vaping Use  . Vaping  Use: Never used  Substance and Sexual Activity  . Alcohol use: No  . Drug use: No  . Sexual activity: Not on file

## 2020-10-27 ENCOUNTER — Telehealth: Payer: Self-pay | Admitting: Adult Health

## 2020-10-27 NOTE — Telephone Encounter (Signed)
Do you prescribe this for her?

## 2020-10-27 NOTE — Telephone Encounter (Signed)
Patient's next appt with Rollene Fare is 12/07/20. She is requesting Spironolactone 25 mg called to [id number].

## 2020-10-28 NOTE — Telephone Encounter (Signed)
No, I do not prescribe this.

## 2020-11-01 NOTE — Telephone Encounter (Signed)
Left detailed message and to call back with further questions or concerns.

## 2020-11-02 DIAGNOSIS — L570 Actinic keratosis: Secondary | ICD-10-CM | POA: Diagnosis not present

## 2020-11-02 DIAGNOSIS — C44729 Squamous cell carcinoma of skin of left lower limb, including hip: Secondary | ICD-10-CM | POA: Diagnosis not present

## 2020-11-02 DIAGNOSIS — L905 Scar conditions and fibrosis of skin: Secondary | ICD-10-CM | POA: Diagnosis not present

## 2020-11-03 DIAGNOSIS — Z23 Encounter for immunization: Secondary | ICD-10-CM | POA: Diagnosis not present

## 2020-11-16 ENCOUNTER — Ambulatory Visit: Payer: Medicare Other | Admitting: Orthopaedic Surgery

## 2020-11-17 ENCOUNTER — Ambulatory Visit: Payer: Medicare Other | Admitting: Orthopaedic Surgery

## 2020-11-17 ENCOUNTER — Other Ambulatory Visit: Payer: Self-pay

## 2020-11-22 ENCOUNTER — Ambulatory Visit
Admission: RE | Admit: 2020-11-22 | Discharge: 2020-11-22 | Disposition: A | Discharge status: Home / Self Care | Payer: Medicare Other | Source: Ambulatory Visit | Attending: Orthopaedic Surgery | Admitting: Orthopaedic Surgery

## 2020-11-22 DIAGNOSIS — G8929 Other chronic pain: Secondary | ICD-10-CM

## 2020-11-22 DIAGNOSIS — M48061 Spinal stenosis, lumbar region without neurogenic claudication: Secondary | ICD-10-CM | POA: Diagnosis not present

## 2020-11-22 DIAGNOSIS — M5441 Lumbago with sciatica, right side: Secondary | ICD-10-CM

## 2020-11-30 ENCOUNTER — Ambulatory Visit (INDEPENDENT_AMBULATORY_CARE_PROVIDER_SITE_OTHER): Payer: Medicare Other | Admitting: Physician Assistant

## 2020-11-30 ENCOUNTER — Encounter: Payer: Self-pay | Admitting: Physician Assistant

## 2020-11-30 DIAGNOSIS — G8929 Other chronic pain: Secondary | ICD-10-CM

## 2020-11-30 DIAGNOSIS — M5441 Lumbago with sciatica, right side: Secondary | ICD-10-CM

## 2020-11-30 NOTE — Progress Notes (Signed)
HPI: Ms. Lyne returns today to go over the MRI of her lumbar spine.  She states the pain comes and goes is worse whenever she is seated like watching a movie for prolonged period of time.  Pain goes down the right leg to the knee area lateral aspect. MRI lumbar spine dated 11/22/2020 compared to MRI 2017 shows multilevel degenerative changes.  Progression of L4-L5 mild to moderate canal stenosis and also increased right greater than left foraminal stenosis with moderate right foraminal stenosis.. Patient's been seen by Dr. Alvester Morin in the past and in August 2021 underwent bilateral L4-5 radiofrequency ablation.   Physical exam: General: Well-developed well-nourished female no acute distress.  Ambulates without any assistive device. Lumbar spine: Positive straight leg raise on the left negative on the right.  Impression: Lumbar radicular pain right leg  Plan: Given his new MRI will send patient back for possible intervention at L4-5.  Questions were encouraged and answered at length.

## 2020-12-01 NOTE — Addendum Note (Signed)
Addended by: Barbette Or on: 12/01/2020 08:11 AM   Modules accepted: Orders

## 2020-12-05 ENCOUNTER — Other Ambulatory Visit: Payer: Self-pay | Admitting: Physical Medicine and Rehabilitation

## 2020-12-05 ENCOUNTER — Telehealth: Payer: Self-pay

## 2020-12-05 MED ORDER — BACLOFEN 10 MG PO TABS: 10.0000 mg | ORAL_TABLET | Freq: Three times a day (TID) | ORAL | 0 refills | Status: DC | PRN

## 2020-12-05 NOTE — Telephone Encounter (Signed)
Sent baclofen muscle relaxer, consider gabapentin, make sure f/up Ninfa Linden after injection

## 2020-12-05 NOTE — Telephone Encounter (Signed)
Patient  Called she is requesting rx for pain until 1/31she needs rx to be sent to [facility] at [address]. [phone]

## 2020-12-05 NOTE — Progress Notes (Signed)
Baclofen muscle relaxer, consider gabapentin

## 2020-12-05 NOTE — Telephone Encounter (Signed)
Please advise 

## 2020-12-06 ENCOUNTER — Telehealth: Payer: Self-pay | Admitting: Interventional Cardiology

## 2020-12-06 NOTE — Telephone Encounter (Signed)
Per patient, her monitor for her ILR has been disabled and she would like to know why she is still scheduled for remote device checks. Please advise.

## 2020-12-06 NOTE — Telephone Encounter (Signed)
Patient reports she ,"thought DR Tamala Julian had told me to unplug my monitor,". Patient advised to plug in LINQ home monitor to resume remote monitoring. Education done on benefits of monitoring LINQ transmissions for cryptogenic stroke and AF detection. Patient will plug in home  Monitor to resume monitor.

## 2020-12-07 ENCOUNTER — Ambulatory Visit (INDEPENDENT_AMBULATORY_CARE_PROVIDER_SITE_OTHER): Payer: Medicare Other | Admitting: Adult Health

## 2020-12-07 ENCOUNTER — Other Ambulatory Visit: Payer: Self-pay

## 2020-12-07 ENCOUNTER — Encounter: Payer: Self-pay | Admitting: Adult Health

## 2020-12-07 DIAGNOSIS — G4700 Insomnia, unspecified: Secondary | ICD-10-CM | POA: Diagnosis not present

## 2020-12-07 DIAGNOSIS — F411 Generalized anxiety disorder: Secondary | ICD-10-CM | POA: Diagnosis not present

## 2020-12-07 DIAGNOSIS — F410 Panic disorder [episodic paroxysmal anxiety] without agoraphobia: Secondary | ICD-10-CM | POA: Diagnosis not present

## 2020-12-07 DIAGNOSIS — F331 Major depressive disorder, recurrent, moderate: Secondary | ICD-10-CM | POA: Diagnosis not present

## 2020-12-07 MED ORDER — SERTRALINE HCL 100 MG PO TABS: 100.0000 mg | ORAL_TABLET | Freq: Every day | ORAL | 5 refills | Status: DC

## 2020-12-07 MED ORDER — TRAZODONE HCL 150 MG PO TABS: 75.0000 mg | ORAL_TABLET | Freq: Every day | ORAL | 5 refills | Status: DC

## 2020-12-07 MED ORDER — BUPROPION HCL ER (XL) 300 MG PO TB24: 300.0000 mg | ORAL_TABLET | Freq: Every day | ORAL | 5 refills | Status: DC

## 2020-12-07 MED ORDER — LAMOTRIGINE 100 MG PO TABS
100.0000 mg | ORAL_TABLET | Freq: Every day | ORAL | 5 refills | Status: DC
Start: 2020-12-07 — End: 2021-02-21

## 2020-12-07 NOTE — Progress Notes (Signed)
April Armstrong [id number] 06-Dec-1950 70 y.o.  Subjective:   Patient ID:  April Armstrong is a 70 y.o. (DOB 09/27/1951) female.  Chief Complaint: No chief complaint on file.   HPI April Armstrong presents to the office today for follow-up of MDD, GAD, insomnia, and panic attacks.   Describes mood today as "ok". Pleasant. Mood symptoms - denies depression, anxiety, and irritability. Denies panic attacks. Stating "I feel good". Feels like medications continue to work well for her. She and husband doing well. Has finished chicken coop - has 10 chickens producing eggs. Stable interest and motivation. Taking medications as prescribed.  Energy levels stable. Active, has a regular exercise routine.  Enjoys some usual interests and activities. Married. Lives with husband of 4 years and 3 dogs. Has 3 daughters and 8 grandchildren. Appetite adequate. Weight stable. Sleeps well most nights. Averages 5 hours. Focus and concentration stable. Completing tasks. Managing aspects of household.  Denies SI or HI.  Denies AH or VH.  Review of Systems:  Review of Systems  Musculoskeletal: Negative for gait problem.  Neurological: Negative for tremors.  Psychiatric/Behavioral:       Please refer to HPI    Medications: I have reviewed the patient's current medications.  Current Outpatient Medications  Medication Sig Dispense Refill  . aspirin 325 MG tablet Take 1 tablet (325 mg total) by mouth daily.    Marland Kitchen atorvastatin (LIPITOR) 80 MG tablet Take 1 tablet (80 mg total) by mouth at bedtime. 30 tablet 0  . baclofen (LIORESAL) 10 MG tablet Take 1 tablet (10 mg total) by mouth every 8 (eight) hours as needed for muscle spasms (Pain). 60 tablet 0  . buPROPion (WELLBUTRIN XL) 300 MG 24 hr tablet Take 1 tablet (300 mg total) by mouth daily. 30 tablet 5  . Dextromethorphan Polistirex (ROBITUSSIN 12 HOUR COUGH PO) Take by mouth as needed.    . diclofenac sodium (VOLTAREN) 1 % GEL APPLY 2-4 GRAMS TO  LARGE JOINT AREA UP TO FOUR TIMES A DAY AS NEEDED 2 Tube 3  . lamoTRIgine (LAMICTAL) 100 MG tablet Take 1 tablet (100 mg total) by mouth daily. 30 tablet 5  . levothyroxine (SYNTHROID) 100 MCG tablet Take 100 mcg by mouth daily.    Marland Kitchen loratadine (CLARITIN) 10 MG tablet Take 10 mg by mouth daily.    . metoprolol succinate (TOPROL-XL) 25 MG 24 hr tablet Take 25 mg by mouth daily.     Marland Kitchen omeprazole (PRILOSEC) 40 MG capsule Take 40 mg by mouth daily.     . RESTASIS 0.05 % ophthalmic emulsion Place 1 drop into both eyes 2 (two) times daily.     . sertraline (ZOLOFT) 100 MG tablet Take 1 tablet (100 mg total) by mouth daily. 30 tablet 5  . spironolactone (ALDACTONE) 25 MG tablet Take 25 mg by mouth daily.     . traZODone (DESYREL) 150 MG tablet Take 0.5 tablets (75 mg total) by mouth at bedtime. 30 tablet 5  . vitamin B-12 (CYANOCOBALAMIN) 100 MCG tablet Take 100 mcg by mouth at bedtime.     No current facility-administered medications for this visit.    Medication Side Effects: None  Allergies: No Known Allergies  Past Medical History:  Diagnosis Date  . Depression   . GERD (gastroesophageal reflux disease)   . Hypercholesterolemia   . Hypertension   . Hypothyroidism   . IBS (irritable bowel syndrome)   . Stroke ([place])   . Thyroid disease     Family  History  Problem Relation Age of Onset  . Dementia Mother   . Hypercholesterolemia Mother   . Heart disease Father   . Stroke Father   . Heart disease Brother   . Congestive Heart Failure Sister   . Breast cancer Paternal Aunt     Social History   Socioeconomic History  . Marital status: Married    Spouse name: Not on file  . Number of children: Not on file  . Years of education: Not on file  . Highest education level: Not on file  Occupational History  . Not on file  Tobacco Use  . Smoking status: Never Smoker  . Smokeless tobacco: Current User    Types: Chew  Vaping Use  . Vaping Use: Never used  Substance and Sexual  Activity  . Alcohol use: No  . Drug use: No  . Sexual activity: Not on file  Other Topics Concern  . Not on file  Social History Narrative  . Not on file   Social Determinants of Health   Financial Resource Strain: Not on file  Food Insecurity: Not on file  Transportation Needs: Not on file  Physical Activity: Not on file  Stress: Not on file  Social Connections: Not on file  Intimate Partner Violence: Not on file    Past Medical History, Surgical history, Social history, and Family history were reviewed and updated as appropriate.   Please see review of systems for further details on the patient's review from today.   Objective:   Physical Exam:  There were no vitals taken for this visit.  Physical Exam Constitutional:      General: She is not in acute distress. Musculoskeletal:        General: No deformity.  Neurological:     Mental Status: She is alert and oriented to person, place, and time.     Coordination: Coordination normal.  Psychiatric:        Attention and Perception: Attention and perception normal. She does not perceive auditory or visual hallucinations.        Mood and Affect: Mood normal. Mood is not anxious or depressed. Affect is not labile, blunt, angry or inappropriate.        Speech: Speech normal.        Behavior: Behavior normal.        Thought Content: Thought content normal. Thought content is not paranoid or delusional. Thought content does not include homicidal or suicidal ideation. Thought content does not include homicidal or suicidal plan.        Cognition and Memory: Cognition and memory normal.        Judgment: Judgment normal.     Comments: Insight intact     Lab Review:     Component Value Date/Time   NA 140 04/12/2016 1925   K 4.0 04/12/2016 1925   CL 105 04/12/2016 1925   CO2 28 04/12/2016 1925   GLUCOSE 97 04/12/2016 1925   BUN 21 (H) 04/12/2016 1925   CREATININE 0.77 04/12/2016 1925   CALCIUM 9.4 04/12/2016 1925    GFRNONAA >60 04/12/2016 1925   GFRAA >60 04/12/2016 1925       Component Value Date/Time   WBC 6.4 10/03/2018 1707   RBC 4.48 10/03/2018 1707   HGB 13.3 10/03/2018 1707   HCT 42.7 10/03/2018 1707   PLT 293 10/03/2018 1707   MCV 95.3 10/03/2018 1707   MCH 29.7 10/03/2018 1707   MCHC 31.1 10/03/2018 1707   RDW 12.7 10/03/2018  1707    No results found for: POCLITH, LITHIUM   No results found for: PHENYTOIN, PHENOBARB, VALPROATE, CBMZ   .res Assessment: Plan:    Plan:  1. Lamictal 100mg  daily 2. Wellbutrin XL 300mg  daily 3. Zoloft 100mg  daily 4. Trazadone 150mg  daily - taking 75mg  at hs  RTC 3 months  Patient advised to contact office with any questions, adverse effects, or acute worsening in signs and symptoms.  Counseled patient regarding potential benefits, risks, and side effects of Lamictal to include potential risk of Stevens-Johnson syndrome. Advised patient to stop taking Lamictal and contact office immediately if rash develops and to seek urgent medical attention if rash is severe and/or spreading quickly.  Discussed potential benefits, risk, and side effects of benzodiazepines to include potential risk of tolerance and dependence, as well as possible drowsiness.  Advised patient not to drive if experiencing drowsiness and to take lowest possible effective dose to minimize risk of dependence and tolerance.    There are no diagnoses linked to this encounter.   Please see After Visit Summary for patient specific instructions.  Future Appointments  Date Time Provider [place]  12/26/2020  9:05 AM [facility] DEVICE REMOTES [facility]  12/26/2020  2:00 PM Magnus Sinning, MD [facility] None  01/30/2021  9:05 AM [facility] DEVICE REMOTES [facility]  02/02/2021  3:30 PM [facility] DX DEXA 1 [facility]  03/06/2021  9:05 AM [facility] DEVICE REMOTES [facility]  04/10/2021  9:05 AM [facility] DEVICE REMOTES [facility]  [facility]  05/15/2021  9:05 AM [facility] DEVICE REMOTES [facility]  06/19/2021  9:05 AM [facility] DEVICE REMOTES [facility]  07/24/2021  9:05 AM [facility] DEVICE REMOTES [facility]    No orders of the defined types were placed in this encounter.   -------------------------------

## 2020-12-12 ENCOUNTER — Ambulatory Visit: Payer: Medicare Other | Admitting: Adult Health

## 2020-12-21 ENCOUNTER — Other Ambulatory Visit: Payer: Self-pay | Admitting: Physical Medicine and Rehabilitation

## 2020-12-21 NOTE — Telephone Encounter (Signed)
Please advise 

## 2020-12-22 DIAGNOSIS — Z85828 Personal history of other malignant neoplasm of skin: Secondary | ICD-10-CM | POA: Diagnosis not present

## 2020-12-22 DIAGNOSIS — L821 Other seborrheic keratosis: Secondary | ICD-10-CM | POA: Diagnosis not present

## 2020-12-22 DIAGNOSIS — D2271 Melanocytic nevi of right lower limb, including hip: Secondary | ICD-10-CM | POA: Diagnosis not present

## 2020-12-22 DIAGNOSIS — L570 Actinic keratosis: Secondary | ICD-10-CM | POA: Diagnosis not present

## 2020-12-22 DIAGNOSIS — D485 Neoplasm of uncertain behavior of skin: Secondary | ICD-10-CM | POA: Diagnosis not present

## 2020-12-22 DIAGNOSIS — L578 Other skin changes due to chronic exposure to nonionizing radiation: Secondary | ICD-10-CM | POA: Diagnosis not present

## 2020-12-22 DIAGNOSIS — D225 Melanocytic nevi of trunk: Secondary | ICD-10-CM | POA: Diagnosis not present

## 2020-12-26 ENCOUNTER — Encounter: Payer: Self-pay | Admitting: Physical Medicine and Rehabilitation

## 2020-12-26 ENCOUNTER — Other Ambulatory Visit: Payer: Self-pay

## 2020-12-26 ENCOUNTER — Ambulatory Visit: Payer: Self-pay

## 2020-12-26 ENCOUNTER — Ambulatory Visit (INDEPENDENT_AMBULATORY_CARE_PROVIDER_SITE_OTHER): Payer: Medicare Other | Admitting: Physical Medicine and Rehabilitation

## 2020-12-26 VITALS — BP 133/71 | HR 67

## 2020-12-26 DIAGNOSIS — M48062 Spinal stenosis, lumbar region with neurogenic claudication: Secondary | ICD-10-CM

## 2020-12-26 DIAGNOSIS — M5416 Radiculopathy, lumbar region: Secondary | ICD-10-CM | POA: Diagnosis not present

## 2020-12-26 MED ORDER — METHYLPREDNISOLONE ACETATE 80 MG/ML IJ SUSP
80.0000 mg | Freq: Once | INTRAMUSCULAR | Status: AC
  Administered 2020-12-26: 80 mg

## 2020-12-26 NOTE — Patient Instructions (Signed)

## 2020-12-26 NOTE — Progress Notes (Signed)
Pt state lower back pain that travels down her right buttock to her foot. Pt state sitting and standing for a long time makes the pain worse. Pt has hx of inj on 06/29/20 pt state it worked.  Numeric Pain Rating Scale and Functional Assessment Average Pain 5   In the last MONTH (on 0-10 scale) has pain interfered with the following?  1. General activity like being  able to carry out your everyday physical activities such as walking, climbing stairs, carrying groceries, or moving a chair?  Rating(10)   +Driver, -BT, -Dye Allergies.

## 2020-12-26 NOTE — Progress Notes (Addendum)
April Armstrong - 70 y.o. female MRN [id number]  Date of birth: 12-Sep-1951  Office Visit Note: Visit Date: 12/26/2020 PCP: Carol Ada, MD Referred by: Carol Ada, MD  Subjective: Chief Complaint  Patient presents with  . Lower Back - Pain  . Right Leg - Pain   HPI:  April Armstrong is a 70 y.o. female who comes in today at the request of Dr. Jean Rosenthal for planned Right L4-L5 Lumbar epidural steroid injection with fluoroscopic guidance.  The patient has failed conservative care including home exercise, medications, time and activity modification.  This injection will be diagnostic and hopefully therapeutic.  Please see requesting physician notes for further details and justification.  MRI reviewed with images and spine model.  MRI reviewed in the note below.   ROS Otherwise per HPI.  Assessment & Plan: Visit Diagnoses:    ICD-10-CM   1. Lumbar radiculopathy  M54.16 XR C-ARM NO REPORT    Epidural Steroid injection    methylPREDNISolone acetate (DEPO-MEDROL) injection 80 mg  2. Spinal stenosis of lumbar region with neurogenic claudication  M48.062 XR C-ARM NO REPORT    Epidural Steroid injection    methylPREDNISolone acetate (DEPO-MEDROL) injection 80 mg    Plan: No additional findings.   Meds & Orders:  Meds ordered this encounter  Medications  . methylPREDNISolone acetate (DEPO-MEDROL) injection 80 mg    Orders Placed This Encounter  Procedures  . XR C-ARM NO REPORT  . Epidural Steroid injection    Follow-up: Return if symptoms worsen or fail to improve, for Benita Stabile, PA-C.   Procedures: No procedures performed  Lumbosacral Transforaminal Epidural Steroid Injection - Sub-Pedicular Approach with Fluoroscopic Guidance  Patient: April Armstrong      Date of Birth: 13-Jul-1951 MRN: [id number] PCP: Carol Ada, MD      Visit Date: 12/26/2020   Universal Protocol:    Date/Time: 12/26/2020  Consent Given By: the patient  Position:  PRONE  Additional Comments: Vital signs were monitored before and after the procedure. Patient was prepped and draped in the usual sterile fashion. The correct patient, procedure, and site was verified.   Injection Procedure Details:   Procedure diagnoses: Lumbar radiculopathy [M54.16]    Meds Administered:  Meds ordered this encounter  Medications  . methylPREDNISolone acetate (DEPO-MEDROL) injection 80 mg    Laterality: Right  Location/Site:  L4-L5  Needle:5.0 in., 22 ga.  Short bevel or Quincke spinal needle  Needle Placement: Transforaminal  Findings:    -Comments: Excellent flow of contrast along the nerve, nerve root and into the epidural space.  Procedure Details: After squaring off the end-plates to get a true AP view, the C-arm was positioned so that an oblique view of the foramen as noted above was visualized. The target area is just inferior to the "nose of the scotty dog" or sub pedicular. The soft tissues overlying this structure were infiltrated with 2-3 ml. of 1% Lidocaine without Epinephrine.  The spinal needle was inserted toward the target using a "trajectory" view along the fluoroscope beam.  Under AP and lateral visualization, the needle was advanced so it did not puncture dura and was located close the 6 O'Clock position of the pedical in AP tracterory. Biplanar projections were used to confirm position. Aspiration was confirmed to be negative for CSF and/or blood. A 1-2 ml. volume of Isovue-250 was injected and flow of contrast was noted at each level. Radiographs were obtained for documentation purposes.   After attaining the desired flow  of contrast documented above, a 0.5 to 1.0 ml test dose of 0.25% Marcaine was injected into each respective transforaminal space.  The patient was observed for 90 seconds post injection.  After no sensory deficits were reported, and normal lower extremity motor function was noted,   the above injectate was administered so  that equal amounts of the injectate were placed at each foramen (level) into the transforaminal epidural space.   Additional Comments:  The patient tolerated the procedure well Dressing: 2 x 2 sterile gauze and Band-Aid    Post-procedure details: Patient was observed during the procedure. Post-procedure instructions were reviewed.  Patient left the clinic in stable condition.      Clinical History: MRI LUMBAR SPINE WITHOUT CONTRAST  TECHNIQUE: Multiplanar, multisequence MR imaging of the lumbar spine was performed. No intravenous contrast was administered.  COMPARISON:  2017  FINDINGS: Segmentation:  Standard.  Alignment:  Increased mild grade 1 anterolisthesis at L4-L5.  Vertebrae: Stable vertebral body heights. No substantial marrow edema. No suspicious osseous lesion.  Conus medullaris and cauda equina: Conus extends to the T12-L1 level. Conus and cauda equina appear normal.  Paraspinal and other soft tissues: Unremarkable.  Disc levels:  L1-L2:  No canal or foraminal stenosis.  L2-L3: Disc bulge. Mild facet arthropathy. No canal or foraminal stenosis.  L3-L4: Disc bulge. Mild facet arthropathy. No canal or foraminal stenosis.  L4-L5: Anterolisthesis with uncovering of disc bulge with superimposed right foraminal disc protrusion. Moderate facet arthropathy with ligamentum flavum infolding. Increased mild to moderate canal stenosis. Increased narrowing of the right greater than left subarticular recesses. Increased moderate right foraminal stenosis. Similar mild left foraminal stenosis.  L5-S1:  No canal or foraminal stenosis.  IMPRESSION: Multilevel degenerative changes as detailed above with progression at L4-L5 since 2017. There is right subarticular recess and foraminal stenosis at this level with potential nerve root compression.   Electronically Signed   By: Macy Mis M.D.   On: 11/22/2020 13:53     Objective:  VS:   HT:    WT:   BMI:     BP:133/71  HR:67bpm  TEMP: ( )  RESP:  Physical Exam Vitals and nursing note reviewed.  Constitutional:      General: She is not in acute distress.    Appearance: Normal appearance. She is obese. She is not ill-appearing.  HENT:     Head: Normocephalic and atraumatic.     Right Ear: External ear normal.     Left Ear: External ear normal.  Eyes:     Extraocular Movements: Extraocular movements intact.  Cardiovascular:     Rate and Rhythm: Normal rate.     Pulses: Normal pulses.  Pulmonary:     Effort: Pulmonary effort is normal. No respiratory distress.  Abdominal:     General: There is no distension.     Palpations: Abdomen is soft.  Musculoskeletal:        General: Tenderness present.     Cervical back: Neck supple.     Right lower leg: No edema.     Left lower leg: No edema.     Comments: Patient has good distal strength with no pain over the greater trochanters.  No clonus or focal weakness.  Skin:    Findings: No erythema, lesion or rash.  Neurological:     General: No focal deficit present.     Mental Status: She is alert and oriented to person, place, and time.     Sensory: No sensory deficit.  Motor: No weakness or abnormal muscle tone.     Coordination: Coordination normal.  Psychiatric:        Mood and Affect: Mood normal.        Behavior: Behavior normal.      Imaging: No results found.

## 2021-01-07 ENCOUNTER — Other Ambulatory Visit: Payer: Self-pay | Admitting: Physical Medicine and Rehabilitation

## 2021-01-09 NOTE — Telephone Encounter (Signed)
Please advise 

## 2021-01-23 DIAGNOSIS — M84374A Stress fracture, right foot, initial encounter for fracture: Secondary | ICD-10-CM | POA: Diagnosis not present

## 2021-01-25 DIAGNOSIS — D485 Neoplasm of uncertain behavior of skin: Secondary | ICD-10-CM | POA: Diagnosis not present

## 2021-01-25 DIAGNOSIS — L570 Actinic keratosis: Secondary | ICD-10-CM | POA: Diagnosis not present

## 2021-01-30 ENCOUNTER — Other Ambulatory Visit: Payer: Self-pay | Admitting: Physical Medicine and Rehabilitation

## 2021-01-30 DIAGNOSIS — M84374D Stress fracture, right foot, subsequent encounter for fracture with routine healing: Secondary | ICD-10-CM | POA: Diagnosis not present

## 2021-01-30 NOTE — Telephone Encounter (Signed)
Please advise 

## 2021-02-01 ENCOUNTER — Telehealth: Payer: Self-pay | Admitting: Physical Medicine and Rehabilitation

## 2021-02-01 NOTE — Telephone Encounter (Signed)
Pt called stating she got a lumbar injection 12/27/20 and it stopped working 01/06/21; pt states Dr. Ernestina Patches wanted her to give it a couple weeks and now that she has she would like a CB to discuss some other options.   [phone]

## 2021-02-01 NOTE — Telephone Encounter (Signed)
Right L4 TF on 12/26/20. Please advise.

## 2021-02-02 ENCOUNTER — Other Ambulatory Visit: Payer: Medicare Other

## 2021-02-02 NOTE — Procedures (Signed)
Lumbosacral Transforaminal Epidural Steroid Injection - Sub-Pedicular Approach with Fluoroscopic Guidance  Patient: April Armstrong      Date of Birth: [birth year] MRN: [id number] PCP: Carol Ada, MD      Visit Date: 12/26/2020   Universal Protocol:    Date/Time: 12/26/2020  Consent Given By: the patient  Position: PRONE  Additional Comments: Vital signs were monitored before and after the procedure. Patient was prepped and draped in the usual sterile fashion. The correct patient, procedure, and site was verified.   Injection Procedure Details:   Procedure diagnoses: Lumbar radiculopathy [M54.16]    Meds Administered:  Meds ordered this encounter  Medications  . methylPREDNISolone acetate (DEPO-MEDROL) injection 80 mg    Laterality: Right  Location/Site:  L4-L5  Needle:5.0 in., 22 ga.  Short bevel or Quincke spinal needle  Needle Placement: Transforaminal  Findings:    -Comments: Excellent flow of contrast along the nerve, nerve root and into the epidural space.  Procedure Details: After squaring off the end-plates to get a true AP view, the C-arm was positioned so that an oblique view of the foramen as noted above was visualized. The target area is just inferior to the "nose of the scotty dog" or sub pedicular. The soft tissues overlying this structure were infiltrated with 2-3 ml. of 1% Lidocaine without Epinephrine.  The spinal needle was inserted toward the target using a "trajectory" view along the fluoroscope beam.  Under AP and lateral visualization, the needle was advanced so it did not puncture dura and was located close the 6 O'Clock position of the pedical in AP tracterory. Biplanar projections were used to confirm position. Aspiration was confirmed to be negative for CSF and/or blood. A 1-2 ml. volume of Isovue-250 was injected and flow of contrast was noted at each level. Radiographs were obtained for documentation purposes.   After attaining the  desired flow of contrast documented above, a 0.5 to 1.0 ml test dose of 0.25% Marcaine was injected into each respective transforaminal space.  The patient was observed for 90 seconds post injection.  After no sensory deficits were reported, and normal lower extremity motor function was noted,   the above injectate was administered so that equal amounts of the injectate were placed at each foramen (level) into the transforaminal epidural space.   Additional Comments:  The patient tolerated the procedure well Dressing: 2 x 2 sterile gauze and Band-Aid    Post-procedure details: Patient was observed during the procedure. Post-procedure instructions were reviewed.  Patient left the clinic in stable condition.

## 2021-02-02 NOTE — Telephone Encounter (Signed)
The injection was requested by Artis Delay and Dr. Ninfa Linden to differentially look at her low back and lumbar spine as a source of pain versus her hip joint.  The best plan may be to follow-up with them to let them decide what they think in terms of her spine versus her hip.  Unfortunately if the injections are only lasting a month we can keep doing those.

## 2021-02-02 NOTE — Telephone Encounter (Signed)
Called patient to advise. She will follow up with Artis Delay or Dr. Ninfa Linden.

## 2021-02-03 DIAGNOSIS — L570 Actinic keratosis: Secondary | ICD-10-CM | POA: Diagnosis not present

## 2021-02-13 DIAGNOSIS — M84374D Stress fracture, right foot, subsequent encounter for fracture with routine healing: Secondary | ICD-10-CM | POA: Diagnosis not present

## 2021-02-15 ENCOUNTER — Ambulatory Visit: Payer: Medicare Other | Admitting: Physician Assistant

## 2021-02-21 ENCOUNTER — Other Ambulatory Visit: Payer: Self-pay

## 2021-02-21 ENCOUNTER — Telehealth: Payer: Self-pay | Admitting: Adult Health

## 2021-02-21 DIAGNOSIS — G4700 Insomnia, unspecified: Secondary | ICD-10-CM

## 2021-02-21 DIAGNOSIS — F411 Generalized anxiety disorder: Secondary | ICD-10-CM

## 2021-02-21 DIAGNOSIS — F331 Major depressive disorder, recurrent, moderate: Secondary | ICD-10-CM

## 2021-02-21 MED ORDER — BUPROPION HCL ER (XL) 300 MG PO TB24: 300.0000 mg | ORAL_TABLET | Freq: Every day | ORAL | 0 refills | Status: DC

## 2021-02-21 MED ORDER — LAMOTRIGINE 100 MG PO TABS: 100.0000 mg | ORAL_TABLET | Freq: Every day | ORAL | 0 refills | Status: DC

## 2021-02-21 MED ORDER — TRAZODONE HCL 150 MG PO TABS: 75.0000 mg | ORAL_TABLET | Freq: Every day | ORAL | 0 refills | Status: DC

## 2021-02-21 MED ORDER — SERTRALINE HCL 100 MG PO TABS: 100.0000 mg | ORAL_TABLET | Freq: Every day | ORAL | 0 refills | Status: DC

## 2021-02-21 NOTE — Telephone Encounter (Signed)
Rx's sent prescribed by [facility]

## 2021-02-21 NOTE — Telephone Encounter (Signed)
April Armstrong faxed over refill requests today but I refused them because they were medical medication such as blood pressure medication. I'm not sure if they have it down incorrectly with your name on it.

## 2021-02-21 NOTE — Telephone Encounter (Signed)
Noted  

## 2021-02-21 NOTE — Telephone Encounter (Signed)
Patient lm requesting all medication due for refill be filled. A follow up appt is scheduled for 4/12. Fill at :  [facility] [address] [place] - [address]  [place], [place] [address]  Phone:  [phone] Fax:  [phone]

## 2021-02-22 ENCOUNTER — Ambulatory Visit (INDEPENDENT_AMBULATORY_CARE_PROVIDER_SITE_OTHER): Payer: Medicare Other | Admitting: Orthopaedic Surgery

## 2021-02-22 ENCOUNTER — Encounter: Payer: Self-pay | Admitting: Orthopaedic Surgery

## 2021-02-22 DIAGNOSIS — G8929 Other chronic pain: Secondary | ICD-10-CM | POA: Diagnosis not present

## 2021-02-22 DIAGNOSIS — M5441 Lumbago with sciatica, right side: Secondary | ICD-10-CM

## 2021-02-22 NOTE — Progress Notes (Signed)
The patient is a 70 year old female who is following up after having a right-sided L4/L5 ESI by Dr. Ernestina Patches recently.  She states that the injection helped initially at first and she can feel that that was actually correlating with the pain she was having.  However did not really help much at all.  A MRI of her lumbar spine did show an anterolisthesis at L4 on L5 with a superimposed right foraminal disc protrusion.  She has moderate facet arthritis in that area as well.  This is created increased moderate right foraminal stenosis when compared to previous MRI to the right side at L4-L5.  Examination of her right hip is been normal in terms of range of motion of that hip and no pain in the groin at all.  She does ambit with a cane.  She is frustrated in terms of the pain that she is having.  Her signs and symptoms of pain are consistent more with a spine is to the right side and not her hip at all on my exam.  I would like to send her to Dr. Lorin Mercy for his opinion as to whether or not any other intervention is needed for her lumbar spine.  She agrees with this referral.

## 2021-02-28 ENCOUNTER — Encounter: Payer: Self-pay | Admitting: Orthopaedic Surgery

## 2021-02-28 ENCOUNTER — Ambulatory Visit (INDEPENDENT_AMBULATORY_CARE_PROVIDER_SITE_OTHER): Payer: Medicare Other

## 2021-02-28 ENCOUNTER — Other Ambulatory Visit: Payer: Self-pay

## 2021-02-28 ENCOUNTER — Ambulatory Visit (INDEPENDENT_AMBULATORY_CARE_PROVIDER_SITE_OTHER): Payer: Medicare Other | Admitting: Orthopaedic Surgery

## 2021-02-28 VITALS — BP 124/82 | HR 90 | Ht 62.75 in | Wt 230.0 lb

## 2021-02-28 DIAGNOSIS — G8929 Other chronic pain: Secondary | ICD-10-CM

## 2021-02-28 DIAGNOSIS — M545 Low back pain, unspecified: Secondary | ICD-10-CM

## 2021-02-28 DIAGNOSIS — M48062 Spinal stenosis, lumbar region with neurogenic claudication: Secondary | ICD-10-CM | POA: Diagnosis not present

## 2021-02-28 DIAGNOSIS — M48061 Spinal stenosis, lumbar region without neurogenic claudication: Secondary | ICD-10-CM | POA: Insufficient documentation

## 2021-02-28 NOTE — Progress Notes (Signed)
Office Visit Note   Patient: April Armstrong           Date of Birth: [birth year]           MRN: [id number] Visit Date: 02/28/2021              Requested by: Carol Ada, [place],   [id number] PCP: Carol Ada, MD   Assessment & Plan: Visit Diagnoses:  1. Chronic bilateral low back pain, unspecified whether sciatica present   2. Spinal stenosis of lumbar region with neurogenic claudication     Plan: Patient has progressive degenerative changes L4-5 with disc protrusion moderate central stenosis progressive foraminal stenosis on the right consistent with her symptoms.  We discussed decompression and fusion.  She needs to work on weight loss and when she gets her BMI below 40 she can call us.  We discussed with her that this is a requirement by her insurance for preauthorization for the procedure.  We also discussed additional benefits of weight loss with blood pressure, cholesterol, taking pressure off her back etc.  She will call when she reaches her goal.  Follow-Up Instructions: No follow-ups on file.   Orders:  Orders Placed This Encounter  Procedures  . XR Lumbar Spine Complete   No orders of the defined types were placed in this encounter.     Procedures: No procedures performed   Clinical Data: No additional findings.   Subjective: Chief Complaint  Patient presents with  . Lower Back - Pain    HPI 70 year old female with progressive low back pain over several years now failed to respond to epidural steroid injections last on 12/26/2020.  In the past injections have worked in previous years.  Now she has more right than left leg pain and has claudication symptoms with standing.  She has to break up her housework with repetitive sitting.  She states she can ambulate across 1/2 acre lot they cannot make any further and has to sit down and rest.  When she gets in and out of a car she has to physically lift up her right leg due  to back pain with knee flexion.  Flexion-extension x-ray shows grade 1 6 mm anterolisthesis at L4-5 and lumbar MRI scan 11/22/2020 shows progressive degenerative changes at L4-5 since 2017 with progressive anterolisthesis and right foraminal disc protrusion with moderate central canal stenosis and moderate right foraminal stenosis.  Patient states she is now ready to consider surgery. BMI currently is 41.07 and she needs to lose 5 to 6 pounds to get her BMI below 40. Review of Systems positive hypertension IBS hypothyroidism.  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  Negative for chest pain.  Possible neurogenic claudication symptoms.   Objective: Vital Signs: BP 124/82   Pulse 90   Ht 5' 2.75" (1.594 m)   Wt 230 lb (104.3 kg)   BMI 41.07 kg/m   Physical Exam Constitutional:      Appearance: She is well-developed.  HENT:     Head: Normocephalic.     Right Ear: External ear normal.     Left Ear: External ear normal.  Eyes:     Pupils: Pupils are equal, round, and reactive to light.  Neck:     Thyroid: No thyromegaly.     Trachea: No tracheal deviation.  Cardiovascular:     Rate and Rhythm: Normal rate.  Pulmonary:     Effort: Pulmonary effort is normal.  Abdominal:  Palpations: Abdomen is soft.  Skin:    General: Skin is warm and dry.  Neurological:     Mental Status: She is alert and oriented to person, place, and time.  Psychiatric:        Behavior: Behavior normal.     Ortho Exam patient has sciatic notch tenderness on the right pain with straight leg raising at 60 degrees on the right negative on the left.  EHL weakness one half grade on the right normal on the left anterior tib trace weak.  She can heel and toe walk few steps both sides.  Specialty Comments:  No specialty comments available.  Imaging: Narrative & Impression  CLINICAL DATA:  Lumbar radiculopathy, right-sided sciatica  EXAM: MRI LUMBAR SPINE WITHOUT  CONTRAST  TECHNIQUE: Multiplanar, multisequence MR imaging of the lumbar spine was performed. No intravenous contrast was administered.  COMPARISON:  2017  FINDINGS: Segmentation:  Standard.  Alignment:  Increased mild grade 1 anterolisthesis at L4-L5.  Vertebrae: Stable vertebral body heights. No substantial marrow edema. No suspicious osseous lesion.  Conus medullaris and cauda equina: Conus extends to the T12-L1 level. Conus and cauda equina appear normal.  Paraspinal and other soft tissues: Unremarkable.  Disc levels:  L1-L2:  No canal or foraminal stenosis.  L2-L3: Disc bulge. Mild facet arthropathy. No canal or foraminal stenosis.  L3-L4: Disc bulge. Mild facet arthropathy. No canal or foraminal stenosis.  L4-L5: Anterolisthesis with uncovering of disc bulge with superimposed right foraminal disc protrusion. Moderate facet arthropathy with ligamentum flavum infolding. Increased mild to moderate canal stenosis. Increased narrowing of the right greater than left subarticular recesses. Increased moderate right foraminal stenosis. Similar mild left foraminal stenosis.  L5-S1:  No canal or foraminal stenosis.  IMPRESSION: Multilevel degenerative changes as detailed above with progression at L4-L5 since 2017. There is right subarticular recess and foraminal stenosis at this level with potential nerve root compression.   Electronically Signed   By: Macy Mis M.D.   On: 11/22/2020 13:53      PMFS History: Patient Active Problem List   Diagnosis Date Noted  . Spinal stenosis of lumbar region 02/28/2021  . Trigger finger, left ring finger 03/10/2018  . BPPV (benign paroxysmal positional vertigo) 10/22/2016  . Anxiety state 06/22/2016  . Tachycardia 04/25/2016  . Bilateral occipital neuralgia   . Cryptogenic stroke ([place]) 04/13/2016  . Cerebrovascular accident (CVA) due to thrombosis of right middle cerebral artery ([place]) 04/13/2016  .  Cerebral infarction ([place])   . Thyroid activity decreased   . Depression   . Hypothyroidism   . GERD (gastroesophageal reflux disease)   . Hyperlipidemia   . Hypertension   . IBS (irritable bowel syndrome)    Past Medical History:  Diagnosis Date  . Depression   . GERD (gastroesophageal reflux disease)   . Hypercholesterolemia   . Hypertension   . Hypothyroidism   . IBS (irritable bowel syndrome)   . Stroke ([place])   . Thyroid disease     Family History  Problem Relation Age of Onset  . Dementia Mother   . Hypercholesterolemia Mother   . Heart disease Father   . Stroke Father   . Heart disease Brother   . Congestive Heart Failure Sister   . Breast cancer Paternal Aunt     Past Surgical History:  Procedure Laterality Date  . ABDOMINAL HYSTERECTOMY    . APPENDECTOMY    . CESAREAN SECTION    . CHOLECYSTECTOMY    . EP IMPLANTABLE DEVICE N/A 12/28/2016  Procedure: Loop Recorder Insertion;  Surgeon: Evans Lance, MD;  Location: [facility];  Service: Cardiovascular;  Laterality: N/A;  . THYROIDECTOMY    . TRIGGER FINGER RELEASE     Social History   Occupational History  . Not on file  Tobacco Use  . Smoking status: Never Smoker  . Smokeless tobacco: Current User    Types: Chew  Vaping Use  . Vaping Use: Never used  Substance and Sexual Activity  . Alcohol use: No  . Drug use: No  . Sexual activity: Not on file

## 2021-03-04 ENCOUNTER — Other Ambulatory Visit: Payer: Self-pay

## 2021-03-04 ENCOUNTER — Emergency Department (HOSPITAL_COMMUNITY)
Admission: EM | Admit: 2021-03-04 | Discharge: 2021-03-04 | Discharge status: Against Medical Advice | Payer: Medicare Other | Attending: Emergency Medicine | Admitting: Emergency Medicine

## 2021-03-04 ENCOUNTER — Encounter (HOSPITAL_COMMUNITY): Payer: Self-pay

## 2021-03-04 ENCOUNTER — Emergency Department (HOSPITAL_COMMUNITY): Payer: Medicare Other

## 2021-03-04 DIAGNOSIS — R0602 Shortness of breath: Secondary | ICD-10-CM

## 2021-03-04 DIAGNOSIS — E039 Hypothyroidism, unspecified: Secondary | ICD-10-CM | POA: Insufficient documentation

## 2021-03-04 DIAGNOSIS — J029 Acute pharyngitis, unspecified: Secondary | ICD-10-CM | POA: Diagnosis present

## 2021-03-04 DIAGNOSIS — U071 COVID-19: Secondary | ICD-10-CM | POA: Insufficient documentation

## 2021-03-04 DIAGNOSIS — I10 Essential (primary) hypertension: Secondary | ICD-10-CM | POA: Diagnosis not present

## 2021-03-04 DIAGNOSIS — Z7982 Long term (current) use of aspirin: Secondary | ICD-10-CM | POA: Diagnosis not present

## 2021-03-04 DIAGNOSIS — Z79899 Other long term (current) drug therapy: Secondary | ICD-10-CM | POA: Insufficient documentation

## 2021-03-04 DIAGNOSIS — Z28310 Unvaccinated for covid-19: Secondary | ICD-10-CM | POA: Insufficient documentation

## 2021-03-04 DIAGNOSIS — F17220 Nicotine dependence, chewing tobacco, uncomplicated: Secondary | ICD-10-CM | POA: Insufficient documentation

## 2021-03-04 MED ORDER — ALBUTEROL SULFATE HFA 108 (90 BASE) MCG/ACT IN AERS: 2.0000 | INHALATION_SPRAY | RESPIRATORY_TRACT | Status: DC | PRN

## 2021-03-04 NOTE — ED Triage Notes (Signed)
Patient reports body aches, chills, sore throat and shortness of breath x 2 days, reports she tested positive today with an at home test, reports her O2 was 88% on RA at home.

## 2021-03-04 NOTE — ED Notes (Signed)
Pt found fully dressed and sitting at the chair. Patient stated that she was feeling miserable and cold and wanted to leave. This tech explained to the pt about leaving AMA. Pt was also upset that she waited 45 minutes for an xray and didn't want to stay anymore. Pt is seen walking out the door and did not sign waiver form.

## 2021-03-04 NOTE — ED Notes (Signed)
RN approach patient in hallway. Patient stated that she is going home. Patient stated that she feels miserable and is tired of waiting for Xray. RN educated patient on risks of leaving. Patient aware of risks, patient did not sign waiver for going against medical advice. Provider made aware.

## 2021-03-04 NOTE — ED Provider Notes (Signed)
[facility] EMERGENCY DEPARTMENT Provider Note   CSN: [id number] Arrival date & time: 03/04/21  2007     History Chief Complaint  Patient presents with  . Covid Positive  . Shortness of Breath    April Armstrong is a 70 y.o. female past medical history of hypertension, anxiety, hyperlipidemia, presenting for evaluation of Covid.  She states symptoms began yesterday with chills, body aches, sore throat, dry cough.  She had a rapid home test today which was positive.  Her daughters checked her pulse ox at home and noted to be 88% therefore instructed her to come to the ED for evaluation.  She states she is not feeling short of breath by any means, contrary to triage note.  She not having any chest pain or fevers.  No abdominal complaints.  Has treated symptoms with Tylenol.  Has not received any Covid vaccines.  States she was exposed by her daughter this week.  The history is provided by the patient.       Past Medical History:  Diagnosis Date  . Depression   . GERD (gastroesophageal reflux disease)   . Hypercholesterolemia   . Hypertension   . Hypothyroidism   . IBS (irritable bowel syndrome)   . Stroke ([place])   . Thyroid disease     Patient Active Problem List   Diagnosis Date Noted  . Spinal stenosis of lumbar region 02/28/2021  . Trigger finger, left ring finger 03/10/2018  . BPPV (benign paroxysmal positional vertigo) 10/22/2016  . Anxiety state 06/22/2016  . Tachycardia 04/25/2016  . Bilateral occipital neuralgia   . Cryptogenic stroke ([place]) 04/13/2016  . Cerebrovascular accident (CVA) due to thrombosis of right middle cerebral artery ([place]) 04/13/2016  . Cerebral infarction ([place])   . Thyroid activity decreased   . Depression   . Hypothyroidism   . GERD (gastroesophageal reflux disease)   . Hyperlipidemia   . Hypertension   . IBS (irritable bowel syndrome)     Past Surgical History:  Procedure Laterality Date  . ABDOMINAL HYSTERECTOMY     . APPENDECTOMY    . CESAREAN SECTION    . CHOLECYSTECTOMY    . EP IMPLANTABLE DEVICE N/A 12/28/2016   Procedure: Loop Recorder Insertion;  Surgeon: Evans Lance, MD;  Location: [facility];  Service: Cardiovascular;  Laterality: N/A;  . THYROIDECTOMY    . TRIGGER FINGER RELEASE       OB History   No obstetric history on file.     Family History  Problem Relation Age of Onset  . Dementia Mother   . Hypercholesterolemia Mother   . Heart disease Father   . Stroke Father   . Heart disease Brother   . Congestive Heart Failure Sister   . Breast cancer Paternal Aunt     Social History   Tobacco Use  . Smoking status: Never Smoker  . Smokeless tobacco: Current User    Types: Chew  Vaping Use  . Vaping Use: Never used  Substance Use Topics  . Alcohol use: No  . Drug use: No    Home Medications Prior to Admission medications   Medication Sig Start Date End Date Taking? Authorizing Provider  aspirin 325 MG tablet Take 1 tablet (325 mg total) by mouth daily. 04/14/16   Geradine Girt, DO  atorvastatin (LIPITOR) 80 MG tablet Take 1 tablet (80 mg total) by mouth at bedtime. 04/14/16   Geradine Girt, DO  baclofen (LIORESAL) 10 MG tablet TAKE  ONE TABLET BY MOUTH EVERY 8 HOURS AS NEEDED FOR MUSCLE SPASMS 01/30/21   Magnus Sinning, MD  buPROPion (WELLBUTRIN XL) 300 MG 24 hr tablet Take 1 tablet (300 mg total) by mouth daily. 02/21/21   Mozingo, Berdie Ogren, NP  Dextromethorphan Polistirex (ROBITUSSIN 12 HOUR COUGH PO) Take by mouth as needed.    [provider]  diclofenac sodium (VOLTAREN) 1 % GEL APPLY 2-4 GRAMS TO LARGE JOINT AREA UP TO FOUR TIMES A DAY AS NEEDED 06/20/18   Garald Balding, MD  lamoTRIgine (LAMICTAL) 100 MG tablet Take 1 tablet (100 mg total) by mouth daily. 02/21/21   Mozingo, Berdie Ogren, NP  levothyroxine (SYNTHROID) 100 MCG tablet Take 100 mcg by mouth daily. 06/22/19   [provider]  loratadine (CLARITIN) 10 MG tablet Take  10 mg by mouth daily.    [provider]  metoprolol succinate (TOPROL-XL) 25 MG 24 hr tablet Take 25 mg by mouth daily.  02/10/16   [provider]  omeprazole (PRILOSEC) 40 MG capsule Take 40 mg by mouth daily.  04/09/16   [provider]  RESTASIS 0.05 % ophthalmic emulsion Place 1 drop into both eyes 2 (two) times daily.  02/15/16   [provider]  sertraline (ZOLOFT) 100 MG tablet Take 1 tablet (100 mg total) by mouth daily. 02/21/21   Mozingo, Berdie Ogren, NP  spironolactone (ALDACTONE) 25 MG tablet Take 25 mg by mouth daily.  01/09/16   [provider]  traZODone (DESYREL) 150 MG tablet Take 0.5 tablets (75 mg total) by mouth at bedtime. 02/21/21   Mozingo, Berdie Ogren, NP  vitamin B-12 (CYANOCOBALAMIN) 100 MCG tablet Take 100 mcg by mouth at bedtime.    [provider]    Allergies    Patient has no known allergies.  Review of Systems   Review of Systems  All other systems reviewed and are negative.   Physical Exam Updated Vital Signs BP 118/69 (BP Location: Right Arm)   Pulse 89   Temp 98.5 F (36.9 C) (Oral)   Resp 20   Ht 5' 2.75" (1.594 m)   Wt 104.3 kg   SpO2 95%   BMI 41.07 kg/m   Physical Exam Vitals and nursing note reviewed.  Constitutional:      General: She is not in acute distress.    Appearance: She is well-developed. She is not ill-appearing.  HENT:     Head: Normocephalic and atraumatic.  Eyes:     Conjunctiva/sclera: Conjunctivae normal.  Cardiovascular:     Rate and Rhythm: Normal rate and regular rhythm.  Pulmonary:     Effort: Pulmonary effort is normal. No tachypnea, accessory muscle usage or respiratory distress.     Breath sounds: Normal breath sounds.     Comments: Speaking in full sentences without difficulty.  O2 saturation 97% on room air throughout evaluation. Abdominal:     Palpations: Abdomen is soft.  Skin:    General: Skin is warm.  Neurological:     Mental Status: She is  alert.  Psychiatric:        Behavior: Behavior normal.     ED Results / Procedures / Treatments   Labs (all labs ordered are listed, but only abnormal results are displayed) Labs Reviewed - No data to display  EKG None  Radiology No results found.  Procedures Procedures   Medications Ordered in ED Medications  albuterol (VENTOLIN HFA) 108 (90 Base) MCG/ACT inhaler 2 puff (has no administration in time range)  ED Course  I have reviewed the triage vital signs and the nursing notes.  Pertinent labs & imaging results that were available during my care of the patient were reviewed by me and considered in my medical decision making (see chart for details).    MDM Rules/Calculators/A&P                          Patient here for evaluation of low pulse ox reading at home at 88%.  Patient began having symptoms of COVID yesterday, positive rapid home test today.  She however is not complaining any shortness of breath.  She does have some mild dry cough, body aches, chills, and some mild URI symptoms.  She however is very well-appearing and in no distress.  She is afebrile with stable vital signs, excellent O2 saturation on room air.  Not vaccinated against COVID.  Chest x-ray was ordered, orders for ambulation while monitoring pulse oximetry, however patient decided to leave [place] due to wait time to receive chest x-ray.  She is witnessed by nursing staff ambulating with steady gait without difficulty from the ED.  Overall presentation is very reassuring.  she is aware can return as needed if symptoms worsen.  Final Clinical Impression(s) / ED Diagnoses Final diagnoses:  [id number]    Rx / DC Orders ED Discharge Orders    None       Chantale Leugers, [place] N, PA-C 03/04/21 [id number]    Dorie Rank, MD 03/05/21 1451

## 2021-03-07 ENCOUNTER — Encounter: Payer: Self-pay | Admitting: Adult Health

## 2021-03-07 ENCOUNTER — Telehealth (INDEPENDENT_AMBULATORY_CARE_PROVIDER_SITE_OTHER): Payer: Medicare Other | Admitting: Adult Health

## 2021-03-07 DIAGNOSIS — F410 Panic disorder [episodic paroxysmal anxiety] without agoraphobia: Secondary | ICD-10-CM

## 2021-03-07 DIAGNOSIS — F331 Major depressive disorder, recurrent, moderate: Secondary | ICD-10-CM | POA: Diagnosis not present

## 2021-03-07 DIAGNOSIS — F411 Generalized anxiety disorder: Secondary | ICD-10-CM | POA: Diagnosis not present

## 2021-03-07 DIAGNOSIS — G4700 Insomnia, unspecified: Secondary | ICD-10-CM

## 2021-03-07 MED ORDER — BUPROPION HCL ER (XL) 300 MG PO TB24: 300.0000 mg | ORAL_TABLET | Freq: Every day | ORAL | 5 refills | Status: DC

## 2021-03-07 MED ORDER — TRAZODONE HCL 150 MG PO TABS: 75.0000 mg | ORAL_TABLET | Freq: Every day | ORAL | 5 refills | Status: DC

## 2021-03-07 MED ORDER — SERTRALINE HCL 100 MG PO TABS: 100.0000 mg | ORAL_TABLET | Freq: Every day | ORAL | 5 refills | Status: DC

## 2021-03-07 MED ORDER — LAMOTRIGINE 100 MG PO TABS: 100.0000 mg | ORAL_TABLET | Freq: Every day | ORAL | 5 refills | Status: DC

## 2021-03-07 NOTE — Progress Notes (Addendum)
[name] [id number] 1951-08-26 70 y.o.  Virtual Visit via Video Note  I connected with pt @ on 03/07/21 at  2:40 PM EDT by a video enabled telemedicine application and verified that I am speaking with the correct person using two identifiers.   I discussed the limitations of evaluation and management by telemedicine and the availability of in person appointments. The patient expressed understanding and agreed to proceed.  I discussed the assessment and treatment plan with the patient. The patient was provided an opportunity to ask questions and all were answered. The patient agreed with the plan and demonstrated an understanding of the instructions.   The patient was advised to call back or seek an in-person evaluation if the symptoms worsen or if the condition fails to improve as anticipated.  I provided 30 minutes of non-face-to-face time during this encounter.  The patient was located at home.  The provider was located at [facility].   Aloha Gell, NP   Subjective:   Patient ID:  April Armstrong is a 70 y.o. (DOB 06/07/1951) female.  Chief Complaint: No chief complaint on file.   HPI April Armstrong presents for follow-up of MDD, GAD, insomnia, and panic attacks.   Describes mood today as "ok". Pleasant. Mood symptoms - denies depression, anxiety, and irritability. Denies panic attacks. Stating "I'm hanging in there". Recently diagnosed with Covid-19 - she and husband recovering at home. Needing back surgery. Broken toes on both feet. Feels like medications continue to work well for her. She and husband doing well - outside of being sick". Youngest daughter moving to [place]. Stable interest and motivation. Taking medications as prescribed.  Energy levels decreased. Active, has a regular exercise routine.  Enjoys some usual interests and activities. Married. Lives with husband of 56 years and 3 dogs. Has 3 daughters and 8 grandchildren. Appetite  adequate. Weight stable. Sleeps well most nights. Averages 5 hours. Focus and concentration stable. Completing tasks. Managing aspects of household.  Denies SI or HI.  Denies AH or VH.    Review of Systems:  Review of Systems  Musculoskeletal: Negative for gait problem.  Neurological: Negative for tremors.  Psychiatric/Behavioral:       Please refer to HPI    Medications: I have reviewed the patient's current medications.  Current Outpatient Medications  Medication Sig Dispense Refill  . aspirin 325 MG tablet Take 1 tablet (325 mg total) by mouth daily.    Marland Kitchen atorvastatin (LIPITOR) 80 MG tablet Take 1 tablet (80 mg total) by mouth at bedtime. 30 tablet 0  . baclofen (LIORESAL) 10 MG tablet TAKE ONE TABLET BY MOUTH EVERY 8 HOURS AS NEEDED FOR MUSCLE SPASMS 60 tablet 0  . buPROPion (WELLBUTRIN XL) 300 MG 24 hr tablet Take 1 tablet (300 mg total) by mouth daily. 30 tablet 5  . Dextromethorphan Polistirex (ROBITUSSIN 12 HOUR COUGH PO) Take by mouth as needed.    . diclofenac sodium (VOLTAREN) 1 % GEL APPLY 2-4 GRAMS TO LARGE JOINT AREA UP TO FOUR TIMES A DAY AS NEEDED 2 Tube 3  . lamoTRIgine (LAMICTAL) 100 MG tablet Take 1 tablet (100 mg total) by mouth daily. 30 tablet 5  . levothyroxine (SYNTHROID) 100 MCG tablet Take 100 mcg by mouth daily.    Marland Kitchen loratadine (CLARITIN) 10 MG tablet Take 10 mg by mouth daily.    . metoprolol succinate (TOPROL-XL) 25 MG 24 hr tablet Take 25 mg by mouth daily.     Marland Kitchen omeprazole (PRILOSEC) 40  MG capsule Take 40 mg by mouth daily.     . RESTASIS 0.05 % ophthalmic emulsion Place 1 drop into both eyes 2 (two) times daily.     . sertraline (ZOLOFT) 100 MG tablet Take 1 tablet (100 mg total) by mouth daily. 30 tablet 5  . spironolactone (ALDACTONE) 25 MG tablet Take 25 mg by mouth daily.     . traZODone (DESYREL) 150 MG tablet Take 0.5 tablets (75 mg total) by mouth at bedtime. 30 tablet 5  . vitamin B-12 (CYANOCOBALAMIN) 100 MCG tablet Take 100 mcg by mouth at  bedtime.     No current facility-administered medications for this visit.    Medication Side Effects: None  Allergies: No Known Allergies  Past Medical History:  Diagnosis Date  . Depression   . GERD (gastroesophageal reflux disease)   . Hypercholesterolemia   . Hypertension   . Hypothyroidism   . IBS (irritable bowel syndrome)   . Stroke ([place])   . Thyroid disease     Family History  Problem Relation Age of Onset  . Dementia Mother   . Hypercholesterolemia Mother   . Heart disease Father   . Stroke Father   . Heart disease Brother   . Congestive Heart Failure Sister   . Breast cancer Paternal Aunt     Social History   Socioeconomic History  . Marital status: Married    Spouse name: Not on file  . Number of children: Not on file  . Years of education: Not on file  . Highest education level: Not on file  Occupational History  . Not on file  Tobacco Use  . Smoking status: Never Smoker  . Smokeless tobacco: Current User    Types: Chew  Vaping Use  . Vaping Use: Never used  Substance and Sexual Activity  . Alcohol use: No  . Drug use: No  . Sexual activity: Not on file  Other Topics Concern  . Not on file  Social History Narrative  . Not on file   Social Determinants of Health   Financial Resource Strain: Not on file  Food Insecurity: Not on file  Transportation Needs: Not on file  Physical Activity: Not on file  Stress: Not on file  Social Connections: Not on file  Intimate Partner Violence: Not on file    Past Medical History, Surgical history, Social history, and Family history were reviewed and updated as appropriate.   Please see review of systems for further details on the patient's review from today.   Objective:   Physical Exam:  There were no vitals taken for this visit.  Physical Exam Constitutional:      General: She is not in acute distress. Musculoskeletal:        General: No deformity.  Neurological:     Mental Status: She is  alert and oriented to person, place, and time.     Coordination: Coordination normal.  Psychiatric:        Attention and Perception: Attention and perception normal. She does not perceive auditory or visual hallucinations.        Mood and Affect: Mood normal. Mood is not anxious or depressed. Affect is not labile, blunt, angry or inappropriate.        Speech: Speech normal.        Behavior: Behavior normal.        Thought Content: Thought content normal. Thought content is not paranoid or delusional. Thought content does not include homicidal or suicidal ideation. Thought  content does not include homicidal or suicidal plan.        Cognition and Memory: Cognition and memory normal.        Judgment: Judgment normal.     Comments: Insight intact     Lab Review:     Component Value Date/Time   NA 140 04/12/2016 1925   K 4.0 04/12/2016 1925   CL 105 04/12/2016 1925   CO2 28 04/12/2016 1925   GLUCOSE 97 04/12/2016 1925   BUN 21 (H) 04/12/2016 1925   CREATININE 0.77 04/12/2016 1925   CALCIUM 9.4 04/12/2016 1925   GFRNONAA >60 04/12/2016 1925   GFRAA >60 04/12/2016 1925       Component Value Date/Time   WBC 6.4 10/03/2018 1707   RBC 4.48 10/03/2018 1707   HGB 13.3 10/03/2018 1707   HCT 42.7 10/03/2018 1707   PLT 293 10/03/2018 1707   MCV 95.3 10/03/2018 1707   MCH 29.7 10/03/2018 1707   MCHC 31.1 10/03/2018 1707   RDW 12.7 10/03/2018 1707    No results found for: POCLITH, LITHIUM   No results found for: PHENYTOIN, PHENOBARB, VALPROATE, CBMZ   .res Assessment: Plan:    Plan:  1. Lamictal 100mg  daily 2. Wellbutrin XL 300mg  daily 3. Zoloft 100mg  daily 4. Trazadone 150mg  daily - taking 75mg  at hs  RTC 3 months  Patient advised to contact office with any questions, adverse effects, or acute worsening in signs and symptoms.  Counseled patient regarding potential benefits, risks, and side effects of Lamictal to include potential risk of Stevens-Johnson syndrome. Advised  patient to stop taking Lamictal and contact office immediately if rash develops and to seek urgent medical attention if rash is severe and/or spreading quickly.  Discussed potential benefits, risk, and side effects of benzodiazepines to include potential risk of tolerance and dependence, as well as possible drowsiness.  Advised patient not to drive if experiencing drowsiness and to take lowest possible effective dose to minimize risk of dependence and tolerance.   Diagnoses and all orders for this visit:  Panic attacks  Insomnia, unspecified type -     traZODone (DESYREL) 150 MG tablet; Take 0.5 tablets (75 mg total) by mouth at bedtime.  Generalized anxiety disorder -     buPROPion (WELLBUTRIN XL) 300 MG 24 hr tablet; Take 1 tablet (300 mg total) by mouth daily. -     sertraline (ZOLOFT) 100 MG tablet; Take 1 tablet (100 mg total) by mouth daily.  Major depressive disorder, recurrent episode, moderate (HCC) -     buPROPion (WELLBUTRIN XL) 300 MG 24 hr tablet; Take 1 tablet (300 mg total) by mouth daily. -     lamoTRIgine (LAMICTAL) 100 MG tablet; Take 1 tablet (100 mg total) by mouth daily. -     sertraline (ZOLOFT) 100 MG tablet; Take 1 tablet (100 mg total) by mouth daily.     Please see After Visit Summary for patient specific instructions.  Future Appointments  Date Time Provider [place]  04/10/2021  9:05 AM [facility] DEVICE REMOTES [facility]  05/15/2021  9:05 AM [facility] DEVICE REMOTES [facility]  06/19/2021  9:05 AM [facility] DEVICE REMOTES [facility]  07/12/2021  1:30 PM [facility] DX DEXA 1 [facility]  07/24/2021  9:05 AM [facility] DEVICE REMOTES [facility]    No orders of the defined types were placed in this encounter.     -------------------------------

## 2021-03-23 DIAGNOSIS — Z0000 Encounter for general adult medical examination without abnormal findings: Secondary | ICD-10-CM | POA: Diagnosis not present

## 2021-04-06 DIAGNOSIS — L570 Actinic keratosis: Secondary | ICD-10-CM | POA: Diagnosis not present

## 2021-04-06 DIAGNOSIS — L578 Other skin changes due to chronic exposure to nonionizing radiation: Secondary | ICD-10-CM | POA: Diagnosis not present

## 2021-05-02 DIAGNOSIS — L988 Other specified disorders of the skin and subcutaneous tissue: Secondary | ICD-10-CM | POA: Diagnosis not present

## 2021-05-02 DIAGNOSIS — D485 Neoplasm of uncertain behavior of skin: Secondary | ICD-10-CM | POA: Diagnosis not present

## 2021-05-15 ENCOUNTER — Encounter: Payer: Self-pay | Admitting: Physical Medicine and Rehabilitation

## 2021-05-15 NOTE — Progress Notes (Signed)
Oleva Koo - 70 y.o. female MRN [id number]  Date of birth: 1951/04/19  Office Visit Note: Visit Date: 10/18/2020 PCP: Carol Ada, MD Referred by: Carol Ada, MD  Subjective: Chief Complaint  Patient presents with   Right Hip - Pain   HPI: April Armstrong is a 70 y.o. female who comes in today For evaluation management chronic worsening severe right hip pain.  She reports 9 out of 10 pain which is mostly lateral hip pain but sometimes referring to the anterior lateral and inner thigh.  Her notes from her prior office visits can be reviewed.  Her case is complicated by history of anxiety disorder and panic attacks as well as cryptogenic stroke.  She has had a history of lumbar spondylosis with history of radiofrequency ablation with good relief of back pain but with really chronic pain syndrome of the lower back hip pelvis.  She has had multiple sacroiliac joint injections and greater trochanteric bursa injections all with some relief for short period.  I had wanted her to see Dr. Ninfa Linden in our office after we completed a pretty diagnostic intra-articular hip injection with fluoroscopic guidance.  She reports that she could not see him fast enough so she did end up going to [facility].  She saw Dr. Lyla Glassing who again reevaluated her from an imaging standpoint for her back and hip.  She ultimately had a intra-articular injection by Dr. Nelva Bush which she said helped for about a week.  She reports that they told her that something was torn but otherwise really did not say much to her.  She would like to see someone in our office about her hip.  She denies any paresthesias down the legs.  No real radicular complaints unless this is partly radicular in nature.  Pain with walking and standing and moving.  It does interfere with her daily living.  She also regroup with physical therapy at [facility]  Review of Systems  Musculoskeletal:  Positive for back pain and joint pain.  All other  systems reviewed and are negative. Otherwise per HPI.  Assessment & Plan: Visit Diagnoses: No diagnosis found.   Plan: Findings:  Chronic pain syndrome and chronic low back pain with history of spondylosis and radicular type pain at times with history of multiple complaints of right hip pain lateral and groin and thigh.  Really no lasting relief.  Lasting relief with radiofrequency ablation of her back is been very beneficial but not her hip.  She has now been to [facility] and would like to see someone in our office.  I once again make referral to [facility] Rosenthal and hopefully they can see her in a fashion that she seems happy with.   Meds & Orders: No orders of the defined types were placed in this encounter.  No orders of the defined types were placed in this encounter.   Follow-up: No follow-ups on file.   Procedures: No procedures performed      Clinical History:    She reports that she has never smoked. Her smokeless tobacco use includes chew. No results for input(s): HGBA1C, LABURIC in the last 8760 hours.  Objective:  VS:  HT:    WT:   BMI:     BP:111/72  HR:63bpm  TEMP: ( )  RESP:  Physical Exam Vitals and nursing note reviewed.  Constitutional:      General: She is not in acute distress.    Appearance: Normal appearance. She is obese. She is not  ill-appearing.  HENT:     Head: Normocephalic and atraumatic.     Right Ear: External ear normal.     Left Ear: External ear normal.  Eyes:     Extraocular Movements: Extraocular movements intact.  Cardiovascular:     Rate and Rhythm: Normal rate.     Pulses: Normal pulses.  Pulmonary:     Effort: Pulmonary effort is normal. No respiratory distress.  Abdominal:     General: There is no distension.     Palpations: Abdomen is soft.  Musculoskeletal:        General: Tenderness present.     Cervical back: Neck supple.     Right lower leg: No edema.     Left lower leg: No edema.     Comments: Patient has good  distal strength with no clonus or focal weakness.  She has intact sensation in all dermatomes and peripheral nerve distributions of the lower extremities.  She has no swelling.  She has good hip range of motion on the left.  She has pretty good hip range of motion on the right potentially positive Patrick's on the right.  And range of motion with internal rotation on the right causes pain.  Some pain over the right greater trochanter.  She does have some tender points throughout.  Skin:    Findings: No erythema, lesion or rash.  Neurological:     General: No focal deficit present.     Mental Status: She is alert and oriented to person, place, and time.     Sensory: No sensory deficit.     Motor: No weakness or abnormal muscle tone.     Coordination: Coordination normal.  Psychiatric:        Mood and Affect: Mood normal.        Behavior: Behavior normal.    Ortho Exam  Imaging: No results found.  Past Medical/Family/Surgical/Social History: Medications & Allergies reviewed per EMR, new medications updated. Patient Active Problem List   Diagnosis Date Noted   Spinal stenosis of lumbar region 02/28/2021   Trigger finger, left ring finger 03/10/2018   BPPV (benign paroxysmal positional vertigo) 10/22/2016   Anxiety state 06/22/2016   Tachycardia 04/25/2016   Bilateral occipital neuralgia    Cryptogenic stroke ([place]) 04/13/2016   Cerebrovascular accident (CVA) due to thrombosis of right middle cerebral artery ([place]) 04/13/2016   Cerebral infarction ([place])    Thyroid activity decreased    Depression    Hypothyroidism    GERD (gastroesophageal reflux disease)    Hyperlipidemia    Hypertension    IBS (irritable bowel syndrome)    Past Medical History:  Diagnosis Date   Depression    GERD (gastroesophageal reflux disease)    Hypercholesterolemia    Hypertension    Hypothyroidism    IBS (irritable bowel syndrome)    Stroke ([place])    Thyroid disease    Family History  Problem  Relation Age of Onset   Dementia Mother    Hypercholesterolemia Mother    Heart disease Father    Stroke Father    Heart disease Brother    Congestive Heart Failure Sister    Breast cancer Paternal Aunt    Past Surgical History:  Procedure Laterality Date   ABDOMINAL HYSTERECTOMY     APPENDECTOMY     CESAREAN SECTION     CHOLECYSTECTOMY     EP IMPLANTABLE DEVICE N/A 12/28/2016   Procedure: Loop Recorder Insertion;  Surgeon: Evans Lance, MD;  Location: [facility]  INVASIVE CV LAB;  Service: Cardiovascular;  Laterality: N/A;   THYROIDECTOMY     TRIGGER FINGER RELEASE     Social History   Occupational History   Not on file  Tobacco Use   Smoking status: Never   Smokeless tobacco: Current    Types: Chew  Vaping Use   Vaping Use: Never used  Substance and Sexual Activity   Alcohol use: No   Drug use: No   Sexual activity: Not on file

## 2021-05-22 DIAGNOSIS — R7301 Impaired fasting glucose: Secondary | ICD-10-CM | POA: Diagnosis not present

## 2021-05-22 DIAGNOSIS — I10 Essential (primary) hypertension: Secondary | ICD-10-CM | POA: Diagnosis not present

## 2021-05-22 DIAGNOSIS — F32A Depression, unspecified: Secondary | ICD-10-CM | POA: Diagnosis not present

## 2021-05-22 DIAGNOSIS — E039 Hypothyroidism, unspecified: Secondary | ICD-10-CM | POA: Diagnosis not present

## 2021-05-22 DIAGNOSIS — E782 Mixed hyperlipidemia: Secondary | ICD-10-CM | POA: Diagnosis not present

## 2021-05-25 DIAGNOSIS — Z6839 Body mass index (BMI) 39.0-39.9, adult: Secondary | ICD-10-CM | POA: Diagnosis not present

## 2021-05-25 DIAGNOSIS — E119 Type 2 diabetes mellitus without complications: Secondary | ICD-10-CM | POA: Diagnosis not present

## 2021-05-25 DIAGNOSIS — E669 Obesity, unspecified: Secondary | ICD-10-CM | POA: Diagnosis not present

## 2021-05-25 DIAGNOSIS — E782 Mixed hyperlipidemia: Secondary | ICD-10-CM | POA: Diagnosis not present

## 2021-05-25 DIAGNOSIS — I10 Essential (primary) hypertension: Secondary | ICD-10-CM | POA: Diagnosis not present

## 2021-06-16 ENCOUNTER — Other Ambulatory Visit: Payer: Self-pay | Admitting: Physician Assistant

## 2021-06-16 DIAGNOSIS — Z1231 Encounter for screening mammogram for malignant neoplasm of breast: Secondary | ICD-10-CM

## 2021-07-03 DIAGNOSIS — I8312 Varicose veins of left lower extremity with inflammation: Secondary | ICD-10-CM | POA: Diagnosis not present

## 2021-07-03 DIAGNOSIS — I8311 Varicose veins of right lower extremity with inflammation: Secondary | ICD-10-CM | POA: Diagnosis not present

## 2021-07-12 ENCOUNTER — Other Ambulatory Visit: Payer: Self-pay

## 2021-07-12 ENCOUNTER — Ambulatory Visit
Admission: RE | Admit: 2021-07-12 | Discharge: 2021-07-12 | Disposition: A | Discharge status: Home / Self Care | Payer: Medicare Other | Source: Ambulatory Visit | Attending: Physician Assistant | Admitting: Physician Assistant

## 2021-07-12 DIAGNOSIS — M85852 Other specified disorders of bone density and structure, left thigh: Secondary | ICD-10-CM | POA: Diagnosis not present

## 2021-07-12 DIAGNOSIS — E2839 Other primary ovarian failure: Secondary | ICD-10-CM

## 2021-07-12 DIAGNOSIS — Z780 Asymptomatic menopausal state: Secondary | ICD-10-CM | POA: Diagnosis not present

## 2021-07-18 DIAGNOSIS — D485 Neoplasm of uncertain behavior of skin: Secondary | ICD-10-CM | POA: Diagnosis not present

## 2021-07-18 DIAGNOSIS — L821 Other seborrheic keratosis: Secondary | ICD-10-CM | POA: Diagnosis not present

## 2021-07-18 DIAGNOSIS — L578 Other skin changes due to chronic exposure to nonionizing radiation: Secondary | ICD-10-CM | POA: Diagnosis not present

## 2021-07-18 DIAGNOSIS — D2271 Melanocytic nevi of right lower limb, including hip: Secondary | ICD-10-CM | POA: Diagnosis not present

## 2021-07-18 DIAGNOSIS — H02829 Cysts of unspecified eye, unspecified eyelid: Secondary | ICD-10-CM | POA: Diagnosis not present

## 2021-07-18 DIAGNOSIS — Z85828 Personal history of other malignant neoplasm of skin: Secondary | ICD-10-CM | POA: Diagnosis not present

## 2021-07-18 DIAGNOSIS — L570 Actinic keratosis: Secondary | ICD-10-CM | POA: Diagnosis not present

## 2021-07-18 DIAGNOSIS — D225 Melanocytic nevi of trunk: Secondary | ICD-10-CM | POA: Diagnosis not present

## 2021-07-24 DIAGNOSIS — Z6837 Body mass index (BMI) 37.0-37.9, adult: Secondary | ICD-10-CM | POA: Diagnosis not present

## 2021-07-24 DIAGNOSIS — I10 Essential (primary) hypertension: Secondary | ICD-10-CM | POA: Diagnosis not present

## 2021-07-24 DIAGNOSIS — E119 Type 2 diabetes mellitus without complications: Secondary | ICD-10-CM | POA: Diagnosis not present

## 2021-07-24 DIAGNOSIS — E669 Obesity, unspecified: Secondary | ICD-10-CM | POA: Diagnosis not present

## 2021-08-02 ENCOUNTER — Ambulatory Visit: Payer: Medicare Other | Admitting: Surgery

## 2021-08-03 ENCOUNTER — Telehealth: Payer: Self-pay

## 2021-08-03 NOTE — Telephone Encounter (Signed)
Patient called in stating she wants her ILR taken out and her monitor doesn't work and she refuses to get it fixed as she thinks her ILR is dead anyway. She wants to be seen with GT or APP so they can discuss taking it out

## 2021-08-09 ENCOUNTER — Ambulatory Visit
Admission: RE | Admit: 2021-08-09 | Discharge: 2021-08-09 | Disposition: A | Discharge status: Home / Self Care | Payer: Medicare Other | Source: Ambulatory Visit | Attending: Physician Assistant | Admitting: Physician Assistant

## 2021-08-09 ENCOUNTER — Other Ambulatory Visit: Payer: Self-pay

## 2021-08-09 ENCOUNTER — Ambulatory Visit (INDEPENDENT_AMBULATORY_CARE_PROVIDER_SITE_OTHER): Payer: Medicare Other | Admitting: Orthopaedic Surgery

## 2021-08-09 ENCOUNTER — Encounter: Payer: Self-pay | Admitting: Orthopaedic Surgery

## 2021-08-09 VITALS — BP 108/71 | HR 74 | Ht 62.75 in | Wt 208.4 lb

## 2021-08-09 DIAGNOSIS — Z1231 Encounter for screening mammogram for malignant neoplasm of breast: Secondary | ICD-10-CM

## 2021-08-09 DIAGNOSIS — M48062 Spinal stenosis, lumbar region with neurogenic claudication: Secondary | ICD-10-CM

## 2021-08-09 NOTE — Progress Notes (Signed)
Office Visit Note   Patient: April Armstrong           Date of Birth: 1951-01-21           MRN: [id number] Visit Date: 08/09/2021              Requested by: Carol Ada, [address] [place],  [place] [address] PCP: Carol Ada, MD   Assessment & Plan: Visit Diagnoses:  1. Spinal stenosis of lumbar region with neurogenic claudication     Plan: Patient has lost weight BMI is 37.  Patient is on calcium vitamin D.  We discussed single level L4-5 decompression instrumented fusion for her progressive listhesis and progressive stenosis.  She has family members at home available for care postoperatively.  We discussed risks of dural tear, reoperation, pseudoarthrosis.  Spinal fluid leak good discussed.  Risks of anesthesia including risk of recurrent CVA versus MI.  Questions were elicited and answered she understands and requests we proceed.  Need for preoperative clearance discussed.  Follow-Up Instructions: No follow-ups on file.   Orders:  No orders of the defined types were placed in this encounter.  No orders of the defined types were placed in this encounter.     Procedures: No procedures performed   Clinical Data: No additional findings.   Subjective: Chief Complaint  Patient presents with   Lower Back - Pain    HPI 70 year old female returns with ongoing problems with chronic back pain.  She has been working on weight loss she did weigh 230 last visit 208 pounds.  She has anterolisthesis at L4-5 of 16 mm.  Progressive listhesis with foraminal and central stenosis.  Past history of CVA with thrombosis of the right middle cerebral artery.  On his increased pain with standing and prolonged walking.  She has had epidural steroid injections physical therapy without relief.  Medications without relief.  Review of Systems 14 point system update unchanged from 02/28/21 office visit.   Objective: Vital Signs: BP 108/71   Pulse 74   Ht 5'  2.75" (1.594 m)   Wt 208 lb 6.4 oz (94.5 kg)   BMI 37.21 kg/m   Physical Exam Constitutional:      Appearance: She is well-developed.  HENT:     Head: Normocephalic.     Right Ear: External ear normal.     Left Ear: External ear normal. There is no impacted cerumen.  Eyes:     Pupils: Pupils are equal, round, and reactive to light.  Neck:     Thyroid: No thyromegaly.     Trachea: No tracheal deviation.  Cardiovascular:     Rate and Rhythm: Normal rate.  Pulmonary:     Effort: Pulmonary effort is normal.  Abdominal:     Palpations: Abdomen is soft.  Musculoskeletal:     Cervical back: No rigidity.  Skin:    General: Skin is warm and dry.  Neurological:     Mental Status: She is alert and oriented to person, place, and time.  Psychiatric:        Behavior: Behavior normal.    Ortho Exam patient is amatory anterior tib gastrocsoleus is intact.  She has pain with straight leg raising on the right 60 degrees.  Knee and ankle jerk are intact.  One half grade weakness anterior tib and EHL on the right.  Anterior tib EHL on the left may be trace weak.  Gastrocsoleus is strong and symmetrical.  Distal pulse palpable.  Specialty Comments:  No specialty comments available.  Imaging: No results found.   PMFS History: Patient Active Problem List   Diagnosis Date Noted   Spinal stenosis of lumbar region 02/28/2021   Trigger finger, left ring finger 03/10/2018   BPPV (benign paroxysmal positional vertigo) 10/22/2016   Anxiety state 06/22/2016   Tachycardia 04/25/2016   Bilateral occipital neuralgia    Cryptogenic stroke ([place]) 04/13/2016   Cerebrovascular accident (CVA) due to thrombosis of right middle cerebral artery ([place]) 04/13/2016   Cerebral infarction ([place])    Thyroid activity decreased    Depression    Hypothyroidism    GERD (gastroesophageal reflux disease)    Hyperlipidemia    Hypertension    IBS (irritable bowel syndrome)    Past Medical History:  Diagnosis  Date   Depression    GERD (gastroesophageal reflux disease)    Hypercholesterolemia    Hypertension    Hypothyroidism    IBS (irritable bowel syndrome)    Stroke ([place])    Thyroid disease     Family History  Problem Relation Age of Onset   Dementia Mother    Hypercholesterolemia Mother    Heart disease Father    Stroke Father    Heart disease Brother    Congestive Heart Failure Sister    Breast cancer Paternal Aunt     Past Surgical History:  Procedure Laterality Date   ABDOMINAL HYSTERECTOMY     APPENDECTOMY     CESAREAN SECTION     CHOLECYSTECTOMY     EP IMPLANTABLE DEVICE N/A 12/28/2016   Procedure: Loop Recorder Insertion;  Surgeon: Evans Lance, MD;  Location: [place] CV LAB;  Service: Cardiovascular;  Laterality: N/A;   THYROIDECTOMY     TRIGGER FINGER RELEASE     Social History   Occupational History   Not on file  Tobacco Use   Smoking status: Never   Smokeless tobacco: Current    Types: Chew  Vaping Use   Vaping Use: Never used  Substance and Sexual Activity   Alcohol use: No   Drug use: No   Sexual activity: Not on file

## 2021-08-11 ENCOUNTER — Telehealth: Payer: Self-pay | Admitting: *Deleted

## 2021-08-11 NOTE — Telephone Encounter (Signed)
   [facility] Pre-operative Risk Assessment    Patient Name: April Armstrong  DOB: Apr 08, 1951 MRN: [id number]  HEARTCARE STAFF:  - IMPORTANT!!!!!! Under Visit Info/Reason for Call, type in Other and utilize the format Clearance MM/DD/YY or Clearance TBD. Do not use dashes or single digits. - Please review there is not already an duplicate clearance open for this procedure. - If request is for dental extraction, please clarify the # of teeth to be extracted. - If the patient is currently at the dentist's office, call Pre-Op Callback Staff (MA/nurse) to input urgent request.  - If the patient is not currently in the dentist office, please route to the Pre-Op pool.  Request for surgical clearance:  What type of surgery is being performed?  LUMBAR FUSION   When is this surgery scheduled?  TBD  What type of clearance is required (medical clearance vs. Pharmacy clearance to hold med vs. Both)?  MEDICAL  Are there any medications that need to be held prior to surgery and how long?  NOT ON CLEARANCE BUT PT IS ON ASPIRIN  Practice name and name of physician performing surgery?  [facility] / DR. Lorin Mercy  What is the office phone number?  [phone]   7.   What is the office fax number?  [phone]  ATTN:  SHERRIE  8.   Anesthesia type (None, local, MAC, general) ?  GENERAL   Jeanann Lewandowsky 08/11/2021, 2:21 PM  _________________________________________________________________   (provider comments below)

## 2021-08-14 NOTE — Telephone Encounter (Signed)
Primary Cardiologist:Henry Nicholes Stairs III, MD  Chart reviewed as part of pre-operative protocol coverage. Because of April Armstrong's past medical history and time since last visit, he/she will require a follow-up visit in order to better assess preoperative cardiovascular risk.  Pre-op covering staff: - Please schedule appointment and call patient to inform them. - Please contact requesting surgeon's office via preferred method (i.e, phone, fax) to inform them of need for appointment prior to surgery.  If applicable, this message will also be routed to pharmacy pool and/or primary cardiologist for input on holding anticoagulant/antiplatelet agent as requested below so that this information is available at time of patient's appointment.   Deberah Pelton, NP  08/14/2021, 2:42 PM

## 2021-08-14 NOTE — Telephone Encounter (Signed)
I called and s/w the pt about scheduling an appt for pre op clearance. I offered 2 appts for tomorrow though pt will unavailable due to he husband has a stress test. I then stated my next appt would be 09/12/21. Pt asked if Dr. Lovena Le who she has appt with 09/20/21 could just clear her at that appt. I will put the pt on a wait list for possible sooner appt with gen cards if available, if not, pt would like to see if Dr. Lovena Le will clear her at the appt. Pt thanked me for the call and the help. I will send notes to Dr. Lovena Le and to send FYI to requesting office.

## 2021-08-14 NOTE — Telephone Encounter (Signed)
Looks like I have not seen the patient in almost 5 years. She will need to see preop clinic or me for clearance.

## 2021-08-15 ENCOUNTER — Telehealth: Payer: Self-pay

## 2021-08-15 NOTE — Telephone Encounter (Signed)
I called and spoke with patient about scheduling surgery.  She has some questions about recovery and timing of post op progress.  Please call.

## 2021-08-15 NOTE — Telephone Encounter (Signed)
Per Dr. Lovena Le he can see the pt for pre op appt for clearance.I will send a message to [facility] to reach out to the pt with an appt sooner.

## 2021-08-15 NOTE — Telephone Encounter (Signed)
I called and answered patient's questions. She is unable to see her heart doctor for cardiac clearance until October. Her husband is also having some medical issues. She will call back if she is able to see her cardiologist sooner or if she has any other questions.

## 2021-09-01 ENCOUNTER — Other Ambulatory Visit: Payer: Self-pay

## 2021-09-01 ENCOUNTER — Encounter: Payer: Self-pay | Admitting: Internal Medicine

## 2021-09-01 ENCOUNTER — Ambulatory Visit (INDEPENDENT_AMBULATORY_CARE_PROVIDER_SITE_OTHER): Payer: Medicare Other | Admitting: Internal Medicine

## 2021-09-01 VITALS — BP 110/82 | HR 80 | Ht 62.25 in | Wt 202.6 lb

## 2021-09-01 DIAGNOSIS — I10 Essential (primary) hypertension: Secondary | ICD-10-CM

## 2021-09-01 DIAGNOSIS — I639 Cerebral infarction, unspecified: Secondary | ICD-10-CM | POA: Diagnosis not present

## 2021-09-01 DIAGNOSIS — Z01818 Encounter for other preprocedural examination: Secondary | ICD-10-CM

## 2021-09-01 DIAGNOSIS — Z01810 Encounter for preprocedural cardiovascular examination: Secondary | ICD-10-CM | POA: Diagnosis not present

## 2021-09-01 NOTE — Progress Notes (Signed)
HPI Mrs. Hemmelgarn returns today after a long absence from our arrhythmia clinic. She is a pleasant 70 yo woman with a h/o unexplained stroke who underwent ILR insertion 4 years ago. She has not had any atrial fib. She presents today to consider ILR removal. She remains active. She denies chest pain or sob or palpitations or syncope. No edema. She has a h/o back problems and is pending back surgery by Dr. Lorin Mercy. She has noted recent back pain between the shoulder blades as well as chronic back pain. I have recommended she undergo stress testing as her father and brother have had CAD and bypass surgery.  No Known Allergies   Current Outpatient Medications  Medication Sig Dispense Refill   aspirin 325 MG tablet Take 1 tablet (325 mg total) by mouth daily.     atorvastatin (LIPITOR) 80 MG tablet Take 1 tablet (80 mg total) by mouth at bedtime. 30 tablet 0   baclofen (LIORESAL) 10 MG tablet TAKE ONE TABLET BY MOUTH EVERY 8 HOURS AS NEEDED FOR MUSCLE SPASMS 60 tablet 0   buPROPion (WELLBUTRIN XL) 300 MG 24 hr tablet Take 1 tablet (300 mg total) by mouth daily. 30 tablet 5   Dextromethorphan Polistirex (ROBITUSSIN 12 HOUR COUGH PO) Take by mouth as needed.     diclofenac sodium (VOLTAREN) 1 % GEL APPLY 2-4 GRAMS TO LARGE JOINT AREA UP TO FOUR TIMES A DAY AS NEEDED 2 Tube 3   lamoTRIgine (LAMICTAL) 100 MG tablet Take 1 tablet (100 mg total) by mouth daily. 30 tablet 5   levothyroxine (SYNTHROID) 100 MCG tablet Take 100 mcg by mouth daily.     loratadine (CLARITIN) 10 MG tablet Take 10 mg by mouth daily.     metoprolol succinate (TOPROL-XL) 25 MG 24 hr tablet Take 25 mg by mouth daily.      omeprazole (PRILOSEC) 40 MG capsule Take 40 mg by mouth daily.      RESTASIS 0.05 % ophthalmic emulsion Place 1 drop into both eyes 2 (two) times daily.      sertraline (ZOLOFT) 100 MG tablet Take 1 tablet (100 mg total) by mouth daily. 30 tablet 5   spironolactone (ALDACTONE) 25 MG tablet Take 25 mg by mouth  daily.      traZODone (DESYREL) 150 MG tablet Take 0.5 tablets (75 mg total) by mouth at bedtime. 30 tablet 5   vitamin B-12 (CYANOCOBALAMIN) 100 MCG tablet Take 100 mcg by mouth at bedtime.     No current facility-administered medications for this visit.     Past Medical History:  Diagnosis Date   Depression    GERD (gastroesophageal reflux disease)    Hypercholesterolemia    Hypertension    Hypothyroidism    IBS (irritable bowel syndrome)    Stroke [facility])    Thyroid disease     ROS:   All systems reviewed and negative except as noted in the HPI.   Past Surgical History:  Procedure Laterality Date   ABDOMINAL HYSTERECTOMY     APPENDECTOMY     CESAREAN SECTION     CHOLECYSTECTOMY     EP IMPLANTABLE DEVICE N/A 12/28/2016   Procedure: Loop Recorder Insertion;  Surgeon: Evans Lance, MD;  Location: [facility];  Service: Cardiovascular;  Laterality: N/A;   THYROIDECTOMY     TRIGGER FINGER RELEASE       Family History  Problem Relation Age of Onset   Dementia Mother    Hypercholesterolemia Mother  Heart disease Father    Stroke Father    Heart disease Brother    Congestive Heart Failure Sister    Breast cancer Paternal Aunt      Social History   Socioeconomic History   Marital status: Married    Spouse name: Not on file   Number of children: Not on file   Years of education: Not on file   Highest education level: Not on file  Occupational History   Not on file  Tobacco Use   Smoking status: Never   Smokeless tobacco: Current    Types: Chew  Vaping Use   Vaping Use: Never used  Substance and Sexual Activity   Alcohol use: No   Drug use: No   Sexual activity: Not on file  Other Topics Concern   Not on file  Social History Narrative   Not on file   Social Determinants of Health   Financial Resource Strain: Not on file  Food Insecurity: Not on file  Transportation Needs: Not on file  Physical Activity: Not on file  Stress: Not on file   Social Connections: Not on file  Intimate Partner Violence: Not on file     BP 110/82   Pulse 80   Ht 5' 2.25" (1.581 m)   Wt 202 lb 9.6 oz (91.9 kg)   SpO2 94%   BMI 36.76 kg/m   Physical Exam:  Well appearing NAD HEENT: Unremarkable Neck:  No JVD, no thyromegally Lymphatics:  No adenopathy Back:  No CVA tenderness Lungs:  Clear with no wheezes HEART:  Regular rate rhythm, no murmurs, no rubs, no clicks Abd:  soft, positive bowel sounds, no organomegally, no rebound, no guarding Ext:  2 plus pulses, no edema, no cyanosis, no clubbing Skin:  No rashes no nodules Neuro:  CN II through XII intact, motor grossly intact  EKG - nsr  EP Procedure Note  Procedure Performed: ILR removal  Preoperative diagnosis: cryptogenic stroke s/p ILR with the device at RRT  Postop diagnosis: same as preoperative diagnosis  Description of the procedure: after informed consent was obtained the patient was prepped and draped in a sterile fashion. 10 cc of lidocaine was infiltrated. A one cm stab incision was carried out. A combination of blunt and sharp dissection was utilized. The ILR was grasped and the dense fibrous scar tissue was removed and the patient recovered in the usual manner. Benzoin and steristrips were painted on the skin and a bandage applied.   Assess/Plan:  Cryptogenic stroke - her ILR does not demonstrate any atrial fib and she will undergo removal. Shoulder pain - she had a negative stress test 4 years ago but recently notes back pain between the shoulder blades. Not clearly with exertion but she is sedentary. I have recommended an [place] Obesity - she will need to work on weight loss.  Preoperative eval - if her stress test is low risk she can undergo spine surgery.   Carleene Overlie Insiya Oshea,MD

## 2021-09-01 NOTE — Patient Instructions (Addendum)
Medication Instructions:  Your physician recommends that you continue on your current medications as directed. Please refer to the Current Medication list given to you today.  Labwork: None ordered.  Testing/Procedures: You will be scheduled for a lexiscan myoview stress test.  Follow-Up:  Your physician wants you to follow-up depending on results of stress test.  Implantable Loop Recorder Removal, Care After This sheet gives you information about how to care for yourself after your procedure. Your health care provider may also give you more specific instructions. If you have problems or questions, contact your health care provider. What can I expect after the procedure? After the procedure, it is common to have: Soreness or discomfort near the incision. Some swelling or bruising near the incision.  Follow these instructions at home: Incision care   Leave your outer dressing on for 72 hours.  After 72 hours you can remove your outer dressing and shower. Leave adhesive strips in place. These skin closures may need to stay in place for 1-2 weeks. If adhesive strip edges start to loosen and curl up, you may trim the loose edges.  You may remove the strips if they have not fallen off after 2 weeks. Check your incision area every day for signs of infection. Check for: Redness, swelling, or pain. Fluid or blood. Warmth. Pus or a bad smell. Do not take baths, swim, or use a hot tub until your incision is completely healed. If your wound site starts to bleed apply pressure.      If you have any questions/concerns please call the device clinic at [phone].  Activity  Return to your normal activities.  Contact a health care provider if: You have redness, swelling, or pain around your incision. You have a fever.

## 2021-09-04 ENCOUNTER — Telehealth (HOSPITAL_COMMUNITY): Payer: Self-pay | Admitting: *Deleted

## 2021-09-04 ENCOUNTER — Telehealth: Payer: Self-pay

## 2021-09-04 DIAGNOSIS — Z01818 Encounter for other preprocedural examination: Secondary | ICD-10-CM

## 2021-09-04 NOTE — Telephone Encounter (Signed)
Please sign

## 2021-09-04 NOTE — Telephone Encounter (Signed)
Left message on voicemail per DPR in reference to upcoming appointment scheduled on 09/06/21 with detailed instructions given per Myocardial Perfusion Study Information Sheet for the test. LM to arrive 15 minutes early, and that it is imperative to arrive on time for appointment to keep from having the test rescheduled. If you need to cancel or reschedule your appointment, please call the office within 24 hours of your appointment. Failure to do so may result in a cancellation of your appointment, and a $50 no show fee. Phone number given for call back for any questions. Kirstie Peri

## 2021-09-06 ENCOUNTER — Ambulatory Visit (HOSPITAL_COMMUNITY): Payer: Medicare Other | Attending: Internal Medicine

## 2021-09-06 ENCOUNTER — Other Ambulatory Visit: Payer: Self-pay

## 2021-09-06 DIAGNOSIS — I10 Essential (primary) hypertension: Secondary | ICD-10-CM | POA: Insufficient documentation

## 2021-09-06 DIAGNOSIS — Z01818 Encounter for other preprocedural examination: Secondary | ICD-10-CM | POA: Diagnosis not present

## 2021-09-06 DIAGNOSIS — Z01810 Encounter for preprocedural cardiovascular examination: Secondary | ICD-10-CM | POA: Diagnosis not present

## 2021-09-06 DIAGNOSIS — I639 Cerebral infarction, unspecified: Secondary | ICD-10-CM | POA: Insufficient documentation

## 2021-09-06 LAB — MYOCARDIAL PERFUSION IMAGING
LV dias vol: 58 mL (ref 46–106)
LV sys vol: 23 mL
Nuc Stress EF: 61 %
Peak HR: 106 {beats}/min
Rest HR: 75 {beats}/min
Rest Nuclear Isotope Dose: 10.7 mCi
SDS: 1
SRS: 0
SSS: 1
ST Depression (mm): 0 mm
Stress Nuclear Isotope Dose: 29.4 mCi
TID: 1.06

## 2021-09-06 MED ORDER — TECHNETIUM TC 99M TETROFOSMIN IV KIT
29.4000 | PACK | Freq: Once | INTRAVENOUS | Status: AC | PRN
  Administered 2021-09-06: 29.4 via INTRAVENOUS
  Filled 2021-09-06: qty 30

## 2021-09-06 MED ORDER — REGADENOSON 0.4 MG/5ML IV SOLN
0.4000 mg | Freq: Once | INTRAVENOUS | Status: AC
  Administered 2021-09-06: 0.4 mg via INTRAVENOUS

## 2021-09-06 MED ORDER — TECHNETIUM TC 99M TETROFOSMIN IV KIT
10.7000 | PACK | Freq: Once | INTRAVENOUS | Status: AC | PRN
  Administered 2021-09-06: 10.7 via INTRAVENOUS
  Filled 2021-09-06: qty 11

## 2021-09-08 ENCOUNTER — Telehealth: Payer: Self-pay | Admitting: General Practice

## 2021-09-08 NOTE — Telephone Encounter (Signed)
   Primary Cardiologist: Sinclair Grooms, MD  Chart reviewed as part of pre-operative protocol coverage. Given past medical history and time since last visit, based on ACC/AHA guidelines, April Armstrong would be at acceptable risk for the planned procedure without further cardiovascular testing.    I will route this recommendation to the requesting party via Epic fax function and remove from pre-op pool.  Please call with questions.  [name] [name] NP-C    09/08/2021, 3:20 PM [facility] [address] Office [phone] Fax [phone]

## 2021-09-08 NOTE — Progress Notes (Unsigned)
CLEARANCE REQUEST IS IN Epic AS OF 08/11/21.

## 2021-09-15 NOTE — Telephone Encounter (Signed)
Planning surgery 10-11-21.  Patient takes 325 mg Aspirin prescribed about 6 years ago after stroke.  When should she stop taking it?

## 2021-09-18 DIAGNOSIS — Z23 Encounter for immunization: Secondary | ICD-10-CM | POA: Diagnosis not present

## 2021-09-20 ENCOUNTER — Encounter: Payer: Medicare Other | Admitting: Internal Medicine

## 2021-09-27 ENCOUNTER — Other Ambulatory Visit: Payer: Self-pay

## 2021-09-27 NOTE — Telephone Encounter (Signed)
I called patient and left voice mail for return call to advise.

## 2021-10-02 NOTE — Telephone Encounter (Signed)
I called patient and advised. 

## 2021-10-05 ENCOUNTER — Encounter: Payer: Self-pay | Admitting: Surgery

## 2021-10-05 ENCOUNTER — Ambulatory Visit (INDEPENDENT_AMBULATORY_CARE_PROVIDER_SITE_OTHER): Payer: Medicare Other | Admitting: Surgery

## 2021-10-05 ENCOUNTER — Other Ambulatory Visit: Payer: Self-pay

## 2021-10-05 VITALS — BP 128/80 | HR 77 | Ht 62.0 in | Wt 202.0 lb

## 2021-10-05 DIAGNOSIS — M48062 Spinal stenosis, lumbar region with neurogenic claudication: Secondary | ICD-10-CM

## 2021-10-05 NOTE — Progress Notes (Signed)
70 year old white female with history of L4-5 stenosis comes in for preop evaluation.  She continues to have ongoing back pain and right lower extremity radiculopathy.  She is wanting to proceed with L4-5 fusion as scheduled.  We received cardiac clearance and she was advised to hold her aspirin 1 week preop.  Today history and physical performed.  Review of systems negative.  Surgical procedure along with potential rehab/recovery time discussed in great detail.  All questions answered.

## 2021-10-08 NOTE — Progress Notes (Signed)
Surgical Instructions    Your procedure is scheduled on Nov. 16.  Report to [facility] Main Entrance "A" at 10:30 A.M., then check in with the Admitting office.  Call this number if you have problems the morning of surgery:  [phone]   If you have any questions prior to your surgery date call [phone]: Open Monday-Friday 8am-4pm    Remember:  Do not eat  or drink after midnight the night before your surgery      Take these medicines the morning of surgery with A SIP OF WATER :              Bupropion (wellbutrin)             Lamotrigine (lamictal)             Levothyroxine (synthroid)             Omeprazole (prilosec)              Eye drops              Sertraline (zoloft)                 As of today, STOP taking any Aspirin (unless otherwise instructed by your surgeon) Aleve, Naproxen, Ibuprofen, Motrin, Advil, Goody's, BC's, all herbal medications, fish oil, and all vitamins.     After your COVID test   You are not required to quarantine however you are required to wear a well-fitting mask when you are out and around people not in your household.  If your mask becomes wet or soiled, replace with a new one.  Wash your hands often with soap and water for 20 seconds or clean your hands with an alcohol-based hand sanitizer that contains at least 60% alcohol.  Do not share personal items.  Notify your provider: if you are in close contact with someone who has COVID  or if you develop a fever of 100.4 or greater, sneezing, cough, sore throat, shortness of breath or body aches.             Do not wear jewelry or makeup Do not wear lotions, powders, perfumes/colognes, or deodorant. Do not shave 48 hours prior to surgery.  Men may shave face and neck. Do not bring valuables to the hospital. DO Not wear nail polish, gel polish, artificial nails, or any other type of covering on natural nails including finger and toenails. If patients have artificial nails, gel  coating, etc. that need to be removed by a nail salon, please have this removed prior to surgery or surgery may need to be canceled/delayed if the surgeon/ anesthesia feels like the patient is unable to be adequately monitored.             [facility] is not responsible for any belongings or valuables.  Do NOT Smoke (Tobacco/Vaping)  24 hours prior to your procedure  If you use a CPAP at night, you may bring your mask for your overnight stay.   Contacts, glasses, hearing aids, dentures or partials may not be worn into surgery, please bring cases for these belongings   For patients admitted to the hospital, discharge time will be determined by your treatment team.   Patients discharged the day of surgery will not be allowed to drive home, and someone needs to stay with them for 24 hours.  NO VISITORS WILL BE ALLOWED IN PRE-OP WHERE PATIENTS ARE PREPPED FOR SURGERY.  ONLY 1 SUPPORT PERSON MAY BE PRESENT IN THE  WAITING ROOM WHILE YOU ARE IN SURGERY.  IF YOU ARE TO BE ADMITTED, ONCE YOU ARE IN YOUR ROOM YOU WILL BE ALLOWED TWO (2) VISITORS. 1 (ONE) VISITOR MAY STAY OVERNIGHT BUT MUST ARRIVE TO THE ROOM BY 8pm.  Minor children may have two parents present. Special consideration for safety and communication needs will be reviewed on a case by case basis.  Special instructions:    Oral Hygiene is also important to reduce your risk of infection.  Remember - BRUSH YOUR TEETH THE MORNING OF SURGERY WITH YOUR REGULAR TOOTHPASTE   - Preparing For Surgery  Before surgery, you can play an important role. Because skin is not sterile, your skin needs to be as free of germs as possible. You can reduce the number of germs on your skin by washing with CHG (chlorahexidine gluconate) Soap before surgery.  CHG is an antiseptic cleaner which kills germs and bonds with the skin to continue killing germs even after washing.     Please do not use if you have an allergy to CHG or antibacterial soaps. If  your skin becomes reddened/irritated stop using the CHG.  Do not shave (including legs and underarms) for at least 48 hours prior to first CHG shower. It is OK to shave your face.  Please follow these instructions carefully.     Shower the NIGHT BEFORE SURGERY and the MORNING OF SURGERY with CHG Soap.   If you chose to wash your hair, wash your hair first as usual with your normal shampoo. After you shampoo, rinse your hair and body thoroughly to remove the shampoo.  Then [employer] and genitals (private parts) with your normal soap and rinse thoroughly to remove soap.  After that Use CHG Soap as you would any other liquid soap. You can apply CHG directly to the skin and wash gently with a scrungie or a clean washcloth.   Apply the CHG Soap to your body ONLY FROM THE NECK DOWN.  Do not use on open wounds or open sores. Avoid contact with your eyes, ears, mouth and genitals (private parts). Wash Face and genitals (private parts)  with your normal soap.   Wash thoroughly, paying special attention to the area where your surgery will be performed.  Thoroughly rinse your body with warm water from the neck down.  DO NOT shower/wash with your normal soap after using and rinsing off the CHG Soap.  Pat yourself dry with a CLEAN TOWEL.  Wear CLEAN PAJAMAS to bed the night before surgery  Place CLEAN SHEETS on your bed the night before your surgery  DO NOT SLEEP WITH PETS.   Day of Surgery:  Take a shower with CHG soap. Wear Clean/Comfortable clothing the morning of surgery Do not apply any deodorants/lotions.   Remember to brush your teeth WITH YOUR REGULAR TOOTHPASTE.   Please read over the following fact sheets that you were given.

## 2021-10-09 ENCOUNTER — Encounter (HOSPITAL_COMMUNITY)
Admission: RE | Admit: 2021-10-09 | Discharge: 2021-10-09 | Disposition: A | Discharge status: Home / Self Care | Payer: Medicare Other | Source: Ambulatory Visit | Attending: Orthopaedic Surgery | Admitting: Orthopaedic Surgery

## 2021-10-09 ENCOUNTER — Other Ambulatory Visit: Payer: Self-pay

## 2021-10-09 ENCOUNTER — Encounter (HOSPITAL_COMMUNITY): Payer: Self-pay

## 2021-10-09 VITALS — BP 124/74 | HR 79 | Temp 97.9°F | Resp 18 | Ht 62.0 in | Wt 195.0 lb

## 2021-10-09 DIAGNOSIS — Z20822 Contact with and (suspected) exposure to covid-19: Secondary | ICD-10-CM | POA: Diagnosis not present

## 2021-10-09 DIAGNOSIS — Z01818 Encounter for other preprocedural examination: Secondary | ICD-10-CM

## 2021-10-09 DIAGNOSIS — Z01812 Encounter for preprocedural laboratory examination: Secondary | ICD-10-CM | POA: Insufficient documentation

## 2021-10-09 DIAGNOSIS — I10 Essential (primary) hypertension: Secondary | ICD-10-CM | POA: Diagnosis not present

## 2021-10-09 HISTORY — DX: Unspecified osteoarthritis, unspecified site: M19.90

## 2021-10-09 LAB — CBC
HCT: 43.4 % (ref 36.0–46.0)
Hemoglobin: 14.1 g/dL (ref 12.0–15.0)
MCH: 30.4 pg (ref 26.0–34.0)
MCHC: 32.5 g/dL (ref 30.0–36.0)
MCV: 93.5 fL (ref 80.0–100.0)
Platelets: 247 10*3/uL (ref 150–400)
RBC: 4.64 MIL/uL (ref 3.87–5.11)
RDW: 12.9 % (ref 11.5–15.5)
WBC: 8.2 10*3/uL (ref 4.0–10.5)
nRBC: 0 % (ref 0.0–0.2)

## 2021-10-09 LAB — BASIC METABOLIC PANEL
Anion gap: 9 (ref 5–15)
BUN: 7 mg/dL — ABNORMAL LOW (ref 8–23)
CO2: 27 mmol/L (ref 22–32)
Calcium: 9.1 mg/dL (ref 8.9–10.3)
Chloride: 102 mmol/L (ref 98–111)
Creatinine, Ser: 0.78 mg/dL (ref 0.44–1.00)
GFR, Estimated: >60 mL/min (ref >60)
Glucose, Bld: 106 mg/dL — ABNORMAL HIGH (ref 70–99)
Potassium: 3.7 mmol/L (ref 3.5–5.1)
Sodium: 138 mmol/L (ref 135–145)

## 2021-10-09 LAB — TYPE AND SCREEN
ABO/RH(D): A NEG
Antibody Screen: NEGATIVE

## 2021-10-09 LAB — SURGICAL PCR SCREEN
MRSA, PCR: NEGATIVE
Staphylococcus aureus: NEGATIVE

## 2021-10-09 LAB — SARS CORONAVIRUS 2 (TAT 6-24 HRS): SARS Coronavirus 2: NEGATIVE

## 2021-10-09 NOTE — Progress Notes (Signed)
PCP - Roe Coombs Cardiologist - Cristopher Peru  PPM/ICD - had a loop recorder removed 08/2021   Chest x-ray - n/a EKG - 09/01/21 Stress Test - 09/06/21 ECHO - 04/13/16 Cardiac Cath - denies  Sleep Study - denies   No diabetes  As of today, STOP taking any Aspirin (unless otherwise instructed by your surgeon) Aleve, Naproxen, Ibuprofen, Motrin, Advil, Goody's, BC's, all herbal medications, fish oil, diclofenac sodium (VOLTAREN) gel and all vitamins.  ERAS Protcol -yes PRE-SURGERY Ensure or G2- none ordered  COVID TEST- 10/09/21 in PAT   Anesthesia review: yes, has cardiac clearance from Cristopher Peru  Patient denies shortness of breath, fever, cough and chest pain at PAT appointment   All instructions explained to the patient, with a verbal understanding of the material. Patient agrees to go over the instructions while at home for a better understanding. Patient also instructed to self quarantine after being tested for COVID-19. The opportunity to ask questions was provided.

## 2021-10-09 NOTE — Progress Notes (Signed)
Surgical Instructions    Your procedure is scheduled on 10/11/21.  Report to [facility] Main Entrance "A" at 10:30 A.M., then check in with the Admitting office.  Call this number if you have problems the morning of surgery:  [phone]   If you have any questions prior to your surgery date call [phone]: Open Monday-Friday 8am-4pm    Remember:  Do not eat after midnight the night before your surgery  You may drink clear liquids until 9:30am the morning of your surgery.   Clear liquids allowed are: Water, Non-Citrus Juices (without pulp), Carbonated Beverages, Clear Tea, Black Coffee ONLY (NO MILK, CREAM OR POWDERED CREAMER of any kind), and Gatorade    Take these medicines the morning of surgery with A SIP OF WATER           Bupropion (wellbutrin)             Lamotrigine (lamictal)             Levothyroxine (synthroid)             Omeprazole (prilosec)              Eye drops              Sertraline (zoloft)  loratadine (CLARITIN) if needed RESTASIS 0.05 % ophthalmic emulsion  As of today, STOP taking any Aspirin (unless otherwise instructed by your surgeon) Aleve, Naproxen, Ibuprofen, Motrin, Advil, Goody's, BC's, all herbal medications, fish oil, diclofenac sodium (VOLTAREN) gel and all vitamins.     After your COVID test   You are not required to quarantine however you are required to wear a well-fitting mask when you are out and around people not in your household.  If your mask becomes wet or soiled, replace with a new one.  Wash your hands often with soap and water for 20 seconds or clean your hands with an alcohol-based hand sanitizer that contains at least 60% alcohol.  Do not share personal items.  Notify your provider: if you are in close contact with someone who has COVID  or if you develop a fever of 100.4 or greater, sneezing, cough, sore throat, shortness of breath or body aches.             Do not wear jewelry or makeup Do not wear lotions,  powders, perfumes/colognes, or deodorant. Do not shave 48 hours prior to surgery.  Men may shave face and neck. Do not bring valuables to the hospital. DO Not wear nail polish, gel polish, artificial nails, or any other type of covering on natural nails including finger and toenails. If patients have artificial nails, gel coating, etc. that need to be removed by a nail salon, please have this removed prior to surgery or surgery may need to be canceled/delayed if the surgeon/ anesthesia feels like the patient is unable to be adequately monitored.             [facility] is not responsible for any belongings or valuables.  Do NOT Smoke (Tobacco/Vaping)  24 hours prior to your procedure  If you use a CPAP at night, you may bring your mask for your overnight stay.   Contacts, glasses, hearing aids, dentures or partials may not be worn into surgery, please bring cases for these belongings   For patients admitted to the hospital, discharge time will be determined by your treatment team.   Patients discharged the day of surgery will not be allowed to drive home, and someone needs  to stay with them for 24 hours.  NO VISITORS WILL BE ALLOWED IN PRE-OP WHERE PATIENTS ARE PREPPED FOR SURGERY.  ONLY 1 SUPPORT PERSON MAY BE PRESENT IN THE WAITING ROOM WHILE YOU ARE IN SURGERY.  IF YOU ARE TO BE ADMITTED, ONCE YOU ARE IN YOUR ROOM YOU WILL BE ALLOWED TWO (2) VISITORS. 1 (ONE) VISITOR MAY STAY OVERNIGHT BUT MUST ARRIVE TO THE ROOM BY 8pm.  Minor children may have two parents present. Special consideration for safety and communication needs will be reviewed on a case by case basis.  Special instructions:    Oral Hygiene is also important to reduce your risk of infection.  Remember - BRUSH YOUR TEETH THE MORNING OF SURGERY WITH YOUR REGULAR TOOTHPASTE   [facility]- Preparing For Surgery  Before surgery, you can play an important role. Because skin is not sterile, your skin needs to be as free of germs as  possible. You can reduce the number of germs on your skin by washing with CHG (chlorahexidine gluconate) Soap before surgery.  CHG is an antiseptic cleaner which kills germs and bonds with the skin to continue killing germs even after washing.     Please do not use if you have an allergy to CHG or antibacterial soaps. If your skin becomes reddened/irritated stop using the CHG.  Do not shave (including legs and underarms) for at least 48 hours prior to first CHG shower. It is OK to shave your face.  Please follow these instructions carefully.     Shower the NIGHT BEFORE SURGERY and the MORNING OF SURGERY with CHG Soap.   If you chose to wash your hair, wash your hair first as usual with your normal shampoo. After you shampoo, rinse your hair and body thoroughly to remove the shampoo.  Then [employer] and genitals (private parts) with your normal soap and rinse thoroughly to remove soap.  After that Use CHG Soap as you would any other liquid soap. You can apply CHG directly to the skin and wash gently with a scrungie or a clean washcloth.   Apply the CHG Soap to your body ONLY FROM THE NECK DOWN.  Do not use on open wounds or open sores. Avoid contact with your eyes, ears, mouth and genitals (private parts). Wash Face and genitals (private parts)  with your normal soap.   Wash thoroughly, paying special attention to the area where your surgery will be performed.  Thoroughly rinse your body with warm water from the neck down.  DO NOT shower/wash with your normal soap after using and rinsing off the CHG Soap.  Pat yourself dry with a CLEAN TOWEL.  Wear CLEAN PAJAMAS to bed the night before surgery  Place CLEAN SHEETS on your bed the night before your surgery  DO NOT SLEEP WITH PETS.   Day of Surgery:  Take a shower with CHG soap. Wear Clean/Comfortable clothing the morning of surgery Do not apply any deodorants/lotions.   Remember to brush your teeth WITH YOUR REGULAR TOOTHPASTE.    Please read over the following fact sheets that you were given.

## 2021-10-10 NOTE — Anesthesia Preprocedure Evaluation (Addendum)
Anesthesia Evaluation  Patient identified by MRN, date of birth, ID band Patient awake    Reviewed: Allergy & Precautions, NPO status , Patient's Chart, lab work & pertinent test results  Airway Mallampati: I  TM Distance: >3 FB Neck ROM: Full    Dental  (+) Teeth Intact, Dental Advisory Given   Pulmonary neg pulmonary ROS,    Pulmonary exam normal breath sounds clear to auscultation       Cardiovascular hypertension, Pt. on medications Normal cardiovascular exam Rhythm:Regular Rate:Normal     Neuro/Psych PSYCHIATRIC DISORDERS Anxiety Depression L4-5 stenosis, anterolisthesis CVA    GI/Hepatic Neg liver ROS, GERD  Medicated,  Endo/Other  Hypothyroidism Obesity   Renal/GU negative Renal ROS     Musculoskeletal  (+) Arthritis ,   Abdominal   Peds  Hematology negative hematology ROS (+)   Anesthesia Other Findings Day of surgery medications reviewed with the patient.  Reproductive/Obstetrics                           Anesthesia Physical Anesthesia Plan  ASA: 3  Anesthesia Plan: General   Post-op Pain Management:    Induction: Intravenous  PONV Risk Score and Plan: 3 and Dexamethasone and Ondansetron  Airway Management Planned: Oral ETT  Additional Equipment:   Intra-op Plan:   Post-operative Plan: Extubation in OR  Informed Consent: I have reviewed the patients History and Physical, chart, labs and discussed the procedure including the risks, benefits and alternatives for the proposed anesthesia with the patient or authorized representative who has indicated his/her understanding and acceptance.     Dental advisory given  Plan Discussed with: CRNA  Anesthesia Plan Comments: (2nd PIV  PAT note by Karoline Caldwell, PA-C: Patient followed by EP for history of cryptogenic stroke s/p loop recorder insertion 12/28/2016.  Last seen by Dr. Lovena Le 09/01/2021.  Per note, "She has not had  any atrial fib. She presents today to consider ILR removal. She remains active. She denies chest pain or sob or palpitations or syncope. No edema. She has a h/o back problems and is pending back surgery by Dr. Lorin Mercy. She has noted recent back pain between the shoulder blades as well as chronic back pain. I have recommended she undergo stress testing as her father and brother have had CAD and bypass surgery.Marland KitchenMarland KitchenPreoperative eval - if her stress test is low risk she can undergo spine surgery."  Loop recorder was removed at that time.  Nuclear stress 09/06/2021 was low risk, nonischemic.  EKG 09/01/2021: NSR.  Rate 80.  Nuclear stress 09/06/2021: . The study is normal. The study is low risk. . No ST deviation was noted. . LV perfusion is normal. . Left ventricular function is normal. Nuclear stress EF: 61 %. The left ventricular ejection fraction is normal (55-65%). End diastolic cavity size is normal. End systolic cavity size is normal. . Prior study available for comparison from 12/06/2016. No changes compared to prior study. LVEF 60%, no ischemia  Normal perfusion. LVEF 61% with normal wall motion. This is a low risk study. No changes compared to prior study in 2018.  TTE 04/13/2016: - Left ventricle: The cavity size was normal. Systolic function was  normal. The estimated ejection fraction was in the range of 55%  to 60%. Wall motion was normal; there were no regional wall  motion abnormalities. Doppler parameters are consistent with  abnormal left ventricular relaxation (grade 1 diastolic  dysfunction).  - Mitral valve: Calcified annulus.  Impressions:   - Normal LV systolic function; grade 1 diastolic dysfunction.   )      Anesthesia Quick Evaluation

## 2021-10-10 NOTE — Progress Notes (Signed)
Anesthesia Chart Review:  Patient followed by EP for history of cryptogenic stroke s/p loop recorder insertion 12/28/2016.  Last seen by Dr. Lovena Le 09/01/2021.  Per note, "She has not had any atrial fib. She presents today to consider ILR removal. She remains active. She denies chest pain or sob or palpitations or syncope. No edema. She has a h/o back problems and is pending back surgery by Dr. Lorin Mercy. She has noted recent back pain between the shoulder blades as well as chronic back pain. I have recommended she undergo stress testing as her father and brother have had CAD and bypass surgery.Marland KitchenMarland KitchenPreoperative eval - if her stress test is low risk she can undergo spine surgery."  Loop recorder was removed at that time.  Nuclear stress 09/06/2021 was low risk, nonischemic.  EKG 09/01/2021: NSR.  Rate 80.  Nuclear stress 09/06/2021:   The study is normal. The study is low risk.   No ST deviation was noted.   LV perfusion is normal.   Left ventricular function is normal. Nuclear stress EF: 61 %. The left ventricular ejection fraction is normal (55-65%). End diastolic cavity size is normal. End systolic cavity size is normal.   Prior study available for comparison from 12/06/2016. No changes compared to prior study. LVEF 60%, no ischemia   Normal perfusion. LVEF 61% with normal wall motion. This is a low risk study. No changes compared to prior study in 2018.  TTE 04/13/2016: - Left ventricle: The cavity size was normal. Systolic function was    normal. The estimated ejection fraction was in the range of 55%    to 60%. Wall motion was normal; there were no regional wall    motion abnormalities. Doppler parameters are consistent with    abnormal left ventricular relaxation (grade 1 diastolic    dysfunction).  - Mitral valve: Calcified annulus.   Impressions:   - Normal LV systolic function; grade 1 diastolic dysfunction.    Wynonia Musty [facility]/Anesthesiology Phone [phone] 10/10/2021 10:25 AM

## 2021-10-11 ENCOUNTER — Ambulatory Visit (HOSPITAL_COMMUNITY): Payer: Medicare Other | Admitting: Anesthesiology

## 2021-10-11 ENCOUNTER — Ambulatory Visit (HOSPITAL_COMMUNITY): Payer: Medicare Other

## 2021-10-11 ENCOUNTER — Other Ambulatory Visit: Payer: Self-pay

## 2021-10-11 ENCOUNTER — Encounter (HOSPITAL_COMMUNITY): Payer: Self-pay | Admitting: Orthopaedic Surgery

## 2021-10-11 ENCOUNTER — Encounter (HOSPITAL_COMMUNITY)
Admission: RE | Disposition: A | Discharge status: Home / Self Care | Payer: Self-pay | Source: Home / Self Care | Attending: Orthopaedic Surgery

## 2021-10-11 ENCOUNTER — Ambulatory Visit (HOSPITAL_COMMUNITY): Payer: Medicare Other | Admitting: Physician Assistant

## 2021-10-11 ENCOUNTER — Observation Stay (HOSPITAL_COMMUNITY)
Admission: RE | Admit: 2021-10-11 | Discharge: 2021-10-12 | Disposition: A | Discharge status: Home / Self Care | Payer: Medicare Other | Attending: Orthopaedic Surgery | Admitting: Orthopaedic Surgery

## 2021-10-11 DIAGNOSIS — Z79899 Other long term (current) drug therapy: Secondary | ICD-10-CM | POA: Insufficient documentation

## 2021-10-11 DIAGNOSIS — Z981 Arthrodesis status: Secondary | ICD-10-CM

## 2021-10-11 DIAGNOSIS — M48061 Spinal stenosis, lumbar region without neurogenic claudication: Secondary | ICD-10-CM | POA: Diagnosis not present

## 2021-10-11 DIAGNOSIS — E039 Hypothyroidism, unspecified: Secondary | ICD-10-CM | POA: Diagnosis not present

## 2021-10-11 DIAGNOSIS — Z7982 Long term (current) use of aspirin: Secondary | ICD-10-CM | POA: Insufficient documentation

## 2021-10-11 DIAGNOSIS — M4316 Spondylolisthesis, lumbar region: Secondary | ICD-10-CM | POA: Insufficient documentation

## 2021-10-11 DIAGNOSIS — I10 Essential (primary) hypertension: Secondary | ICD-10-CM | POA: Diagnosis not present

## 2021-10-11 DIAGNOSIS — M48062 Spinal stenosis, lumbar region with neurogenic claudication: Secondary | ICD-10-CM

## 2021-10-11 DIAGNOSIS — E7800 Pure hypercholesterolemia, unspecified: Secondary | ICD-10-CM | POA: Diagnosis not present

## 2021-10-11 DIAGNOSIS — Z419 Encounter for procedure for purposes other than remedying health state, unspecified: Secondary | ICD-10-CM

## 2021-10-11 LAB — ABO/RH: ABO/RH(D): A NEG

## 2021-10-11 SURGERY — POSTERIOR LUMBAR FUSION 1 LEVEL
Anesthesia: General | Site: Spine Lumbar

## 2021-10-11 MED ORDER — ACETAMINOPHEN 325 MG PO TABS
650.0000 mg | ORAL_TABLET | ORAL | Status: DC | PRN
  Administered 2021-10-11 – 2021-10-12 (×3): 650 mg via ORAL
  Filled 2021-10-11 (×3): qty 2

## 2021-10-11 MED ORDER — TRAZODONE HCL 150 MG PO TABS
75.0000 mg | ORAL_TABLET | Freq: Every day | ORAL | Status: DC
  Administered 2021-10-11: 75 mg via ORAL
  Filled 2021-10-11: qty 1

## 2021-10-11 MED ORDER — FENTANYL CITRATE (PF) 100 MCG/2ML IJ SOLN
INTRAMUSCULAR | Status: AC
  Filled 2021-10-11: qty 2

## 2021-10-11 MED ORDER — ONDANSETRON HCL 4 MG/2ML IJ SOLN: 4.0000 mg | Freq: Four times a day (QID) | INTRAMUSCULAR | Status: DC | PRN

## 2021-10-11 MED ORDER — SERTRALINE HCL 50 MG PO TABS
100.0000 mg | ORAL_TABLET | Freq: Every day | ORAL | Status: DC
  Filled 2021-10-11: qty 2

## 2021-10-11 MED ORDER — ONDANSETRON HCL 4 MG PO TABS: 4.0000 mg | ORAL_TABLET | Freq: Four times a day (QID) | ORAL | Status: DC | PRN

## 2021-10-11 MED ORDER — POLYETHYLENE GLYCOL 3350 17 G PO PACK: 17.0000 g | PACK | Freq: Every day | ORAL | Status: DC | PRN

## 2021-10-11 MED ORDER — PHENOL 1.4 % MT LIQD: 1.0000 | OROMUCOSAL | Status: DC | PRN

## 2021-10-11 MED ORDER — ORAL CARE MOUTH RINSE: 15.0000 mL | Freq: Once | OROMUCOSAL | Status: AC

## 2021-10-11 MED ORDER — LIDOCAINE 2% (20 MG/ML) 5 ML SYRINGE
INTRAMUSCULAR | Status: DC | PRN
  Administered 2021-10-11: 80 mg via INTRAVENOUS

## 2021-10-11 MED ORDER — CYCLOSPORINE 0.05 % OP EMUL
1.0000 [drp] | Freq: Two times a day (BID) | OPHTHALMIC | Status: DC
Start: 2021-10-11 — End: 2021-10-12
  Filled 2021-10-11 (×3): qty 30

## 2021-10-11 MED ORDER — CEFAZOLIN SODIUM-DEXTROSE 2-4 GM/100ML-% IV SOLN
2.0000 g | INTRAVENOUS | Status: AC
  Administered 2021-10-11: 2 g via INTRAVENOUS
  Filled 2021-10-11: qty 100

## 2021-10-11 MED ORDER — BUPIVACAINE HCL (PF) 0.5 % IJ SOLN
INTRAMUSCULAR | Status: DC | PRN
  Administered 2021-10-11: 10 mL

## 2021-10-11 MED ORDER — THROMBIN 20000 UNITS EX SOLR: CUTANEOUS | Status: DC | PRN

## 2021-10-11 MED ORDER — FENTANYL CITRATE (PF) 100 MCG/2ML IJ SOLN
25.0000 ug | INTRAMUSCULAR | Status: DC | PRN
  Administered 2021-10-11: 50 ug via INTRAVENOUS
  Administered 2021-10-11: 25 ug via INTRAVENOUS

## 2021-10-11 MED ORDER — LACTATED RINGERS IV SOLN: INTRAVENOUS | Status: DC

## 2021-10-11 MED ORDER — SODIUM CHLORIDE 0.9% FLUSH: 3.0000 mL | Freq: Two times a day (BID) | INTRAVENOUS | Status: DC

## 2021-10-11 MED ORDER — LORATADINE 10 MG PO TABS: 10.0000 mg | ORAL_TABLET | Freq: Every day | ORAL | Status: DC | PRN

## 2021-10-11 MED ORDER — SODIUM CHLORIDE 0.9% FLUSH: 3.0000 mL | INTRAVENOUS | Status: DC | PRN

## 2021-10-11 MED ORDER — ROCURONIUM BROMIDE 10 MG/ML (PF) SYRINGE
PREFILLED_SYRINGE | INTRAVENOUS | Status: DC | PRN
  Administered 2021-10-11: 60 mg via INTRAVENOUS
  Administered 2021-10-11: 40 mg via INTRAVENOUS

## 2021-10-11 MED ORDER — BUPIVACAINE HCL (PF) 0.5 % IJ SOLN
INTRAMUSCULAR | Status: AC
  Filled 2021-10-11: qty 30

## 2021-10-11 MED ORDER — DOCUSATE SODIUM 100 MG PO CAPS
100.0000 mg | ORAL_CAPSULE | Freq: Two times a day (BID) | ORAL | Status: DC
  Administered 2021-10-11: 100 mg via ORAL
  Filled 2021-10-11 (×2): qty 1

## 2021-10-11 MED ORDER — PROPOFOL 10 MG/ML IV BOLUS
INTRAVENOUS | Status: AC
  Filled 2021-10-11: qty 20

## 2021-10-11 MED ORDER — SODIUM CHLORIDE 0.9 % IV SOLN: INTRAVENOUS | Status: DC

## 2021-10-11 MED ORDER — PROPOFOL 10 MG/ML IV BOLUS
INTRAVENOUS | Status: DC | PRN
  Administered 2021-10-11: 150 mg via INTRAVENOUS

## 2021-10-11 MED ORDER — HYDROMORPHONE HCL 1 MG/ML IJ SOLN
0.5000 mg | INTRAMUSCULAR | Status: DC | PRN
  Administered 2021-10-11 – 2021-10-12 (×3): 0.5 mg via INTRAVENOUS
  Filled 2021-10-11 (×3): qty 0.5

## 2021-10-11 MED ORDER — ATORVASTATIN CALCIUM 80 MG PO TABS
80.0000 mg | ORAL_TABLET | Freq: Every day | ORAL | Status: DC
  Administered 2021-10-11: 80 mg via ORAL
  Filled 2021-10-11: qty 1

## 2021-10-11 MED ORDER — THROMBIN 20000 UNITS EX SOLR
CUTANEOUS | Status: AC
  Filled 2021-10-11: qty 20000

## 2021-10-11 MED ORDER — LEVOTHYROXINE SODIUM 88 MCG PO TABS
88.0000 ug | ORAL_TABLET | Freq: Every day | ORAL | Status: DC
  Administered 2021-10-12: 06:00:00 88 ug via ORAL
  Filled 2021-10-11: qty 1

## 2021-10-11 MED ORDER — METHOCARBAMOL 500 MG PO TABS
500.0000 mg | ORAL_TABLET | Freq: Four times a day (QID) | ORAL | Status: DC | PRN
  Administered 2021-10-11 – 2021-10-12 (×3): 500 mg via ORAL
  Filled 2021-10-11 (×3): qty 1

## 2021-10-11 MED ORDER — CHLORHEXIDINE GLUCONATE 0.12 % MT SOLN
15.0000 mL | Freq: Once | OROMUCOSAL | Status: AC
  Administered 2021-10-11: 15 mL via OROMUCOSAL
  Filled 2021-10-11: qty 15

## 2021-10-11 MED ORDER — SODIUM CHLORIDE 0.9 % IV SOLN: 250.0000 mL | INTRAVENOUS | Status: DC

## 2021-10-11 MED ORDER — FENTANYL CITRATE (PF) 250 MCG/5ML IJ SOLN
INTRAMUSCULAR | Status: AC
  Filled 2021-10-11: qty 5

## 2021-10-11 MED ORDER — ONDANSETRON HCL 4 MG/2ML IJ SOLN: 4.0000 mg | Freq: Once | INTRAMUSCULAR | Status: DC | PRN

## 2021-10-11 MED ORDER — CALCIUM CARBONATE 1500 (600 CA) MG PO TABS: 600.0000 mg | ORAL_TABLET | Freq: Two times a day (BID) | ORAL | Status: DC

## 2021-10-11 MED ORDER — DEXMEDETOMIDINE (PRECEDEX) IN NS 20 MCG/5ML (4 MCG/ML) IV SYRINGE
PREFILLED_SYRINGE | INTRAVENOUS | Status: DC | PRN
  Administered 2021-10-11: 12 ug via INTRAVENOUS

## 2021-10-11 MED ORDER — METHOCARBAMOL 1000 MG/10ML IJ SOLN
500.0000 mg | Freq: Four times a day (QID) | INTRAVENOUS | Status: DC | PRN
  Filled 2021-10-11: qty 5

## 2021-10-11 MED ORDER — OXYCODONE HCL 5 MG PO TABS
5.0000 mg | ORAL_TABLET | ORAL | Status: DC | PRN
  Administered 2021-10-11 – 2021-10-12 (×4): 5 mg via ORAL
  Filled 2021-10-11 (×4): qty 1

## 2021-10-11 MED ORDER — SPIRONOLACTONE 25 MG PO TABS
25.0000 mg | ORAL_TABLET | Freq: Every day | ORAL | Status: DC
  Filled 2021-10-11: qty 1

## 2021-10-11 MED ORDER — MENTHOL 3 MG MT LOZG: 1.0000 | LOZENGE | OROMUCOSAL | Status: DC | PRN

## 2021-10-11 MED ORDER — HEMOSTATIC AGENTS (NO CHARGE) OPTIME
TOPICAL | Status: DC | PRN
  Administered 2021-10-11: 1 via TOPICAL

## 2021-10-11 MED ORDER — VITAMIN D 25 MCG (1000 UNIT) PO TABS
2000.0000 [IU] | ORAL_TABLET | Freq: Every day | ORAL | Status: DC
  Filled 2021-10-11: qty 2

## 2021-10-11 MED ORDER — ACETAMINOPHEN 500 MG PO TABS
1000.0000 mg | ORAL_TABLET | Freq: Once | ORAL | Status: AC
  Administered 2021-10-11: 1000 mg via ORAL
  Filled 2021-10-11: qty 2

## 2021-10-11 MED ORDER — TRANEXAMIC ACID-NACL 1000-0.7 MG/100ML-% IV SOLN
1000.0000 mg | INTRAVENOUS | Status: AC
  Administered 2021-10-11: 1000 mg via INTRAVENOUS
  Filled 2021-10-11: qty 100

## 2021-10-11 MED ORDER — PANTOPRAZOLE SODIUM 40 MG PO TBEC: 40.0000 mg | DELAYED_RELEASE_TABLET | Freq: Every day | ORAL | Status: DC

## 2021-10-11 MED ORDER — 0.9 % SODIUM CHLORIDE (POUR BTL) OPTIME
TOPICAL | Status: DC | PRN
  Administered 2021-10-11: 1000 mL

## 2021-10-11 MED ORDER — CALCIUM CARBONATE 1250 (500 CA) MG PO TABS
1.0000 | ORAL_TABLET | Freq: Every day | ORAL | Status: DC
  Filled 2021-10-11: qty 1

## 2021-10-11 MED ORDER — SUGAMMADEX SODIUM 200 MG/2ML IV SOLN
INTRAVENOUS | Status: DC | PRN
  Administered 2021-10-11: 200 mg via INTRAVENOUS

## 2021-10-11 MED ORDER — FENTANYL CITRATE (PF) 100 MCG/2ML IJ SOLN
INTRAMUSCULAR | Status: DC | PRN
  Administered 2021-10-11: 100 ug via INTRAVENOUS

## 2021-10-11 MED ORDER — DEXAMETHASONE SODIUM PHOSPHATE 10 MG/ML IJ SOLN
INTRAMUSCULAR | Status: DC | PRN
  Administered 2021-10-11: 10 mg via INTRAVENOUS

## 2021-10-11 MED ORDER — ACETAMINOPHEN 650 MG RE SUPP: 650.0000 mg | RECTAL | Status: DC | PRN

## 2021-10-11 MED ORDER — ONDANSETRON HCL 4 MG/2ML IJ SOLN
INTRAMUSCULAR | Status: DC | PRN
  Administered 2021-10-11: 4 mg via INTRAVENOUS

## 2021-10-11 MED ORDER — BUPROPION HCL ER (XL) 300 MG PO TB24
300.0000 mg | ORAL_TABLET | Freq: Every day | ORAL | Status: DC
  Filled 2021-10-11: qty 1

## 2021-10-11 SURGICAL SUPPLY — 54 items
BAG COUNTER SPONGE SURGICOUNT (BAG) ×4 IMPLANT
BAG SURGICOUNT SPONGE COUNTING (BAG) ×2
BUR ROUND FLUTED 4 SOFT TCH (BURR) ×2 IMPLANT
BUR ROUND FLUTED 4MM SOFT TCH (BURR) ×1
CAP LOCKING THREADED (Cap) ×12 IMPLANT
COVER BACK TABLE 80X110 HD (DRAPES) ×3 IMPLANT
DERMABOND ADVANCED (GAUZE/BANDAGES/DRESSINGS) ×2
DERMABOND ADVANCED .7 DNX12 (GAUZE/BANDAGES/DRESSINGS) ×1 IMPLANT
DRAPE C-ARM 42X72 X-RAY (DRAPES) ×3 IMPLANT
DRAPE LAPAROTOMY T 102X78X121 (DRAPES) ×3 IMPLANT
DRAPE MICROSCOPE LEICA (MISCELLANEOUS) ×3 IMPLANT
DRSG MEPILEX BORDER 4X4 (GAUZE/BANDAGES/DRESSINGS) ×3 IMPLANT
DRSG MEPILEX BORDER 4X8 (GAUZE/BANDAGES/DRESSINGS) ×3 IMPLANT
DRSG PAD ABDOMINAL 8X10 ST (GAUZE/BANDAGES/DRESSINGS) ×6 IMPLANT
DURAPREP 26ML APPLICATOR (WOUND CARE) ×3 IMPLANT
ELECT BLADE 4.0 EZ CLEAN MEGAD (MISCELLANEOUS) ×3
ELECT CAUTERY BLADE 6.4 (BLADE) ×3 IMPLANT
ELECT REM PT RETURN 9FT ADLT (ELECTROSURGICAL) ×3
ELECTRODE BLDE 4.0 EZ CLN MEGD (MISCELLANEOUS) ×1 IMPLANT
ELECTRODE REM PT RTRN 9FT ADLT (ELECTROSURGICAL) ×1 IMPLANT
GLOVE SRG 8 PF TXTR STRL LF DI (GLOVE) ×2 IMPLANT
GLOVE SURG ORTHO LTX SZ7.5 (GLOVE) ×6 IMPLANT
GLOVE SURG UNDER POLY LF SZ8 (GLOVE) ×4
GOWN STRL REUS W/ TWL LRG LVL3 (GOWN DISPOSABLE) ×2 IMPLANT
GOWN STRL REUS W/ TWL XL LVL3 (GOWN DISPOSABLE) ×2 IMPLANT
GOWN STRL REUS W/TWL 2XL LVL3 (GOWN DISPOSABLE) ×3 IMPLANT
GOWN STRL REUS W/TWL LRG LVL3 (GOWN DISPOSABLE) ×4
GOWN STRL REUS W/TWL XL LVL3 (GOWN DISPOSABLE) ×4
KIT BASIN OR (CUSTOM PROCEDURE TRAY) ×3 IMPLANT
KIT POSITION SURG JACKSON T1 (MISCELLANEOUS) IMPLANT
KIT TURNOVER KIT B (KITS) ×3 IMPLANT
MANIFOLD NEPTUNE II (INSTRUMENTS) ×3 IMPLANT
NS IRRIG 1000ML POUR BTL (IV SOLUTION) ×3 IMPLANT
PACK LAMINECTOMY ORTHO (CUSTOM PROCEDURE TRAY) ×3 IMPLANT
PATTIES SURGICAL .5 X.5 (GAUZE/BANDAGES/DRESSINGS) ×3 IMPLANT
PATTIES SURGICAL .75X.75 (GAUZE/BANDAGES/DRESSINGS) ×3 IMPLANT
ROD 40MM SPINAL (Rod) ×6 IMPLANT
SCREW POLY THRD CREO 6.5X40 (Screw) ×12 IMPLANT
SPACER TLIF SIGN 11 SM (Spacer) ×3 IMPLANT
SPONGE SURGIFOAM ABS GEL 100 (HEMOSTASIS) ×3 IMPLANT
SPONGE T-LAP 18X18 ~~LOC~~+RFID (SPONGE) ×3 IMPLANT
SPONGE T-LAP 4X18 ~~LOC~~+RFID (SPONGE) ×6 IMPLANT
SURGIFLO W/THROMBIN 8M KIT (HEMOSTASIS) ×3 IMPLANT
SUT BONE WAX W31G (SUTURE) ×3 IMPLANT
SUT VIC AB 1 CTX 36 (SUTURE) ×4
SUT VIC AB 1 CTX36XBRD ANBCTR (SUTURE) ×2 IMPLANT
SUT VIC AB 2-0 CT1 27 (SUTURE) ×4
SUT VIC AB 2-0 CT1 TAPERPNT 27 (SUTURE) ×2 IMPLANT
SUT VIC AB 3-0 X1 27 (SUTURE) IMPLANT
TOWEL GREEN STERILE (TOWEL DISPOSABLE) ×3 IMPLANT
TOWEL GREEN STERILE FF (TOWEL DISPOSABLE) ×3 IMPLANT
TRAY FOLEY MTR SLVR 16FR STAT (SET/KITS/TRAYS/PACK) ×3 IMPLANT
WATER STERILE IRR 1000ML POUR (IV SOLUTION) ×3 IMPLANT
YANKAUER SUCT BULB TIP NO VENT (SUCTIONS) ×3 IMPLANT

## 2021-10-11 NOTE — Interval H&P Note (Signed)
History and Physical Interval Note:  10/11/2021 12:14 PM  April Armstrong  has presented today for surgery, with the diagnosis of L4-5 stenosis, anterolisthesis.  The various methods of treatment have been discussed with the patient and family. After consideration of risks, benefits and other options for treatment, the patient has consented to  Procedure(s): L4-5 GILL PROCEDURE, RIGHT TRANSFORAMINAL LUMBAR INTERBODY FUSION, CAGE, PEDICLE INSTRUMENTATION, BILATERAL FUSION (N/A) as a surgical intervention.  The patient's history has been reviewed, patient examined, no change in status, stable for surgery.  I have reviewed the patient's chart and labs.  Questions were answered to the patient's satisfaction.     Marybelle Killings

## 2021-10-11 NOTE — Anesthesia Procedure Notes (Signed)
Procedure Name: Intubation Date/Time: 10/11/2021 12:54 PM Performed by: Georgia Duff, CRNA Pre-anesthesia Checklist: Patient identified, Emergency Drugs available, Suction available and Patient being monitored Patient Re-evaluated:Patient Re-evaluated prior to induction Oxygen Delivery Method: Circle System Utilized Preoxygenation: Pre-oxygenation with 100% oxygen Induction Type: IV induction Ventilation: Mask ventilation without difficulty Laryngoscope Size: Miller and 2 Tube type: Oral Tube size: 7.0 mm Number of attempts: 1 Airway Equipment and Method: Stylet and Oral airway Placement Confirmation: ETT inserted through vocal cords under direct vision, positive ETCO2 and breath sounds checked- equal and bilateral Secured at: 21 cm Tube secured with: Tape Dental Injury: Teeth and Oropharynx as per pre-operative assessment

## 2021-10-11 NOTE — Transfer of Care (Signed)
Immediate Anesthesia Transfer of Care Note  Patient: April Armstrong  Procedure(s) Performed: LUMBAR FOUR-FIVE GILL PROCEDURE, RIGHT TRANSFORAMINAL LUMBAR INTERBODY FUSION, CAGE, PEDICLE INSTRUMENTATION, BILATERAL FUSION (Spine Lumbar)  Patient Location: PACU  Anesthesia Type:General  Level of Consciousness: drowsy and patient cooperative  Airway & Oxygen Therapy: Patient Spontanous Breathing  Post-op Assessment: Report given to RN  Post vital signs: Reviewed and stable  Last Vitals:  Vitals Value Taken Time  BP 100/56 10/11/21 1611  Temp 36.6 C 10/11/21 1611  Pulse 80 10/11/21 1617  Resp 18 10/11/21 1617  SpO2 92 % 10/11/21 1617  Vitals shown include unvalidated device data.  Last Pain:  Vitals:   10/11/21 1107  TempSrc:   PainSc: 0-No pain         Complications: No notable events documented.

## 2021-10-11 NOTE — Progress Notes (Signed)
Orthopedic Tech Progress Note Patient Details:  April Armstrong [birth year] [id number]  PACU RN called requesting a LSO BACK BRACE   Ortho Devices Type of Ortho Device: Lumbar corsett Ortho Device/Splint Location: BACK Ortho Device/Splint Interventions: Ordered   Post Interventions Patient Tolerated: Well Instructions Provided: Care of device  Janit Pagan 10/11/2021, 4:41 PM

## 2021-10-11 NOTE — H&P (Signed)
April Armstrong is an 70 y.o. female.   Chief Complaint: Low back pain and lower extremity radiculopathy HPI: 70 year old white female with history of L4-5 stenosis comes in for preop evaluation.  She continues to have ongoing back pain and right lower extremity radiculopathy.  She is wanting to proceed with L4-5 fusion as scheduled.  We received cardiac clearance and she was advised to hold her aspirin 1 week preop.  Today history and physical performed.  Review of systems negative.   Past Medical History:  Diagnosis Date   Arthritis    Depression    GERD (gastroesophageal reflux disease)    Hypercholesterolemia    Hypertension    Hypothyroidism    IBS (irritable bowel syndrome)    Stroke [facility])    Thyroid disease     Past Surgical History:  Procedure Laterality Date   "Tummy Tuck"  2019   ABDOMINAL HYSTERECTOMY     APPENDECTOMY     CESAREAN SECTION     CHOLECYSTECTOMY     EP IMPLANTABLE DEVICE N/A 12/28/2016   Procedure: Loop Recorder Insertion;  Surgeon: Evans Lance, MD;  Location: [facility];  Service: Cardiovascular;  Laterality: N/A;   EYE SURGERY Bilateral    cataract removal   THYROIDECTOMY     TRIGGER FINGER RELEASE      Family History  Problem Relation Age of Onset   Dementia Mother    Hypercholesterolemia Mother    Heart disease Father    Stroke Father    Heart disease Brother    Congestive Heart Failure Sister    Breast cancer Paternal Aunt    Social History:  reports that she has never smoked. She has quit using smokeless tobacco.  Her smokeless tobacco use included chew. She reports that she does not drink alcohol and does not use drugs.  Allergies: No Known Allergies  Medications Prior to Admission  Medication Sig Dispense Refill   atorvastatin (LIPITOR) 80 MG tablet Take 1 tablet (80 mg total) by mouth at bedtime. 30 tablet 0   buPROPion (WELLBUTRIN XL) 300 MG 24 hr tablet Take 1 tablet (300 mg total) by mouth daily. 30 tablet 5    calcium carbonate (OSCAL) 1500 (600 Ca) MG TABS tablet Take 600 mg of elemental calcium by mouth 2 (two) times daily with a meal.     Cholecalciferol (VITAMIN D) 50 MCG (2000 UT) tablet Take 2,000 Units by mouth daily.     lamoTRIgine (LAMICTAL) 100 MG tablet Take 1 tablet (100 mg total) by mouth daily. 30 tablet 5   levothyroxine (SYNTHROID) 88 MCG tablet Take 88 mcg by mouth daily before breakfast.     RESTASIS 0.05 % ophthalmic emulsion Place 1 drop into both eyes 2 (two) times daily.      sertraline (ZOLOFT) 100 MG tablet Take 1 tablet (100 mg total) by mouth daily. 30 tablet 5   spironolactone (ALDACTONE) 25 MG tablet Take 25 mg by mouth daily.      traZODone (DESYREL) 150 MG tablet Take 0.5 tablets (75 mg total) by mouth at bedtime. 30 tablet 5   aspirin 325 MG tablet Take 1 tablet (325 mg total) by mouth daily. (Patient not taking: Reported on 10/05/2021)     baclofen (LIORESAL) 10 MG tablet TAKE ONE TABLET BY MOUTH EVERY 8 HOURS AS NEEDED FOR MUSCLE SPASMS (Patient not taking: No sig reported) 60 tablet 0   diclofenac sodium (VOLTAREN) 1 % GEL APPLY 2-4 GRAMS TO LARGE JOINT AREA UP  TO FOUR TIMES A DAY AS NEEDED 2 Tube 3   loratadine (CLARITIN) 10 MG tablet Take 10 mg by mouth daily as needed for allergies.     omeprazole (PRILOSEC) 40 MG capsule Take 40 mg by mouth daily.       Results for orders placed or performed during the hospital encounter of 10/11/21 (from the past 48 hour(s))  ABO/Rh     Status: None (Preliminary result)   Collection Time: 10/11/21 11:08 AM  Result Value Ref Range   ABO/RH(D) PENDING    No results found.  Review of Systems  Constitutional:  Positive for activity change.  HENT: Negative.    Respiratory: Negative.    Cardiovascular: Negative.   Musculoskeletal:  Positive for back pain and gait problem.  Neurological:  Positive for numbness.  Psychiatric/Behavioral: Negative.     Blood pressure 128/68, pulse 94, temperature 97.9 F (36.6 C), temperature  source Oral, resp. rate 18, height 5\' 2"  (1.575 m), weight 88.5 kg, SpO2 95 %. Physical Exam Constitutional:      Appearance: Normal appearance.  HENT:     Head: Normocephalic and atraumatic.  Eyes:     Extraocular Movements: Extraocular movements intact.  Cardiovascular:     Rate and Rhythm: Regular rhythm.     Heart sounds: Normal heart sounds.  Pulmonary:     Effort: Pulmonary effort is normal. No respiratory distress.  Musculoskeletal:        General: Tenderness present.  Neurological:     Mental Status: She is alert and oriented to person, place, and time.  Psychiatric:        Mood and Affect: Mood normal.     Assessment/Plan L4-5 stenosis, low back pain and lower extremity radiculopathy.  We will proceed with L4-5 fusion as scheduled.  Surgical procedure discussed in great detail along with potential rehab/recovery time.  All questions answered and she wishes to proceed.  Benjiman Core, PA-C 10/11/2021, 11:50 AM

## 2021-10-11 NOTE — Op Note (Signed)
Preop diagnosis: L4-5 spinal stenosis with anterolisthesis and instability.  Postop diagnosis: Same  Procedure: L4-5 Gill procedure, right transforaminal interbody fusion with cage, local bone, pedicle instrumentation and bilateral lateral intertransverse process fusion.  (360 fusion).  Microscope assisted  Surgeon: Rodell Perna, MD  Assistant: Benjiman Core, PA-C medically necessary and present for the entire procedure  Anesthesia: General oral tracheal plus Marcaine skin local  EBL: 200 cc  Implants:Implants  SPACER TLIF SIGN 11 SM - [id number]  Inventory Item: SPACER TLIF SIGN 11 SM Serial no.:  Model/Cat no.: [id number]  Implant name: SPACER TLIF SIGN 11 SM - [id number] Laterality: N/A Area: Lumbar Level 4-5  Manufacturer: [place] Date of Manufacture:    Action: Implanted Number Used: 1   Device Identifier:  Device Identifier Type:     SCREW POLY THRD CREO 6.5X40 - [id number]  Inventory Item: SCREW POLY THRD CREO 6.5X40 Serial no.:  Model/Cat no.: [id number]  Implant name: Lincoln Brigham THRD CREO 6.5X40 - [id number] Laterality: N/A Area: Lumbar Level 4-5  Manufacturer: [place] Date of Manufacture:    Action: Implanted Number Used: 4   Device Identifier:  Device Identifier Type:     CAP LOCKING THREADED - [id number]  Inventory Item: CAP LOCKING THREADED Serial no.:  Model/Cat no.: [id number]  Implant name: CAP LOCKING THREADED - [id number] Laterality: N/A Area: Lumbar Level 4-5  Manufacturer: [place] Date of Manufacture:    Action: Implanted Number Used: 4   Device Identifier:  Device Identifier Type:     ROD 40MM SPINAL - [id number]  Inventory Item: ROD 40MM SPINAL Serial no.:  Model/Cat no.: [id number]  Implant name: ROD 40MM SPINAL - [id number] Laterality: N/A Area: Lumbar Level 4-5  Manufacturer: [place] Date of Manufacture:    Action: Implanted Number Used: 2   Device Identifier:  Device Identifier Type:    Globus pedicle instrumentation.  6.5x40 screws.  40  mm rods.  11 mm banana-shaped cage packed with local bone.  Local bone anterior to the cage and bilateral lateral intertransverse process.  Procedure: After the induction of general anesthesia IV Ancef prophylaxis IV TXA patient placed prone on spine frame careful padding positioning arms at 9090 rolled yellow pads anterior shoulders calf pumpers.  DuraPrep.  Squared with towel sterile skin marker Betadine Steri-Drape and laminectomy sheets and drapes timeout procedure was completed.  Midline incision was made subperiosteal dissection and Kocher clamps were placed on expected L4-5 level confirmed with lateral x-ray there just did not 1 to 2 mm for the area of planned decompression from above and below for the decompression were patient had this severe stenosis anterolisthesis grade 1.5 with degenerative facets.  Once fluoroscopic images confirmed of appropriate position sterile skin marker was used to Savion Washam the bone and then central decompression was performed.  On the right side where patient had more symptoms the disc space was exposed decompressed facet was removed in line with the disc space and disc was meticulously cleaned out using straight pituitaries, angled pituitaries, rasps, angled curettes straight curettes, ring curettes and endplate scrapers.  We progressed up to 11 mm which gave nice tight fit restoration on x-ray fluoroscopic images of appropriate height.  Endplates that were bleeding of bone was meticulously cleaned soft tissue made small tiny great not sized pieces by the scrub nurse and then meticulously packed anterior and then followed by the banana-shaped cage with bone packed in the cage.Creo pedicle instrumentation was used.  Fluoroscopic images showed the cage was kicked to the midline countersunk  several millimeters and there was abundant bone anterior to the cage filling the disc space.  Pedicle screws were then placed x4 with all checking of the fluoroscopy using the hand-held joystick  pushing and advancing it by hand checking under fluoroscopy pedicle feeler, tapping the pedicle feeler, transverse process spurring and lateral gutter burring with 4 mm bur and then placement of the screw.  All screws are in good position.  Screw on the right at L5 look like it angled a little bit inferior however this was the pedicle that was most clearly seen in the inferior aspect the pedicle had no threads sticking through the medial wall with clean.  C arm was angled down the pedicles to make sure everything was in good position.  Rods were placed were locked down compressed on the right side first followed by the left side.  1 screw Cross threaded had to be exchanged.  Final spot pictures were taken with bayonet on the right side showing good position of all hardware cage bone graft.  Bone graft was then meticulously placed over the decorticated transverse processes and lateral gutter.  This used a ball bone graft.  Epidural space was dry all patties were removed.  Thrombin and Gelfoam small patties had been used uterine intermittently.  Epidural space was dry closure of fascia with #1 Vicryl 2-0 Vicryl subtenons tissue skin staple closure postop dressing and transfer recovery room.  Instrument count needle count was correct.  Patient was neurologically intact in recovery room.

## 2021-10-12 DIAGNOSIS — E039 Hypothyroidism, unspecified: Secondary | ICD-10-CM | POA: Diagnosis not present

## 2021-10-12 DIAGNOSIS — Z7982 Long term (current) use of aspirin: Secondary | ICD-10-CM | POA: Diagnosis not present

## 2021-10-12 DIAGNOSIS — Z79899 Other long term (current) drug therapy: Secondary | ICD-10-CM | POA: Diagnosis not present

## 2021-10-12 DIAGNOSIS — M48061 Spinal stenosis, lumbar region without neurogenic claudication: Secondary | ICD-10-CM | POA: Diagnosis not present

## 2021-10-12 DIAGNOSIS — M4316 Spondylolisthesis, lumbar region: Secondary | ICD-10-CM | POA: Diagnosis not present

## 2021-10-12 LAB — GLUCOSE, CAPILLARY: Glucose-Capillary: 118 mg/dL — ABNORMAL HIGH (ref 70–99)

## 2021-10-12 MED ORDER — TIZANIDINE HCL 4 MG PO TABS: 4.0000 mg | ORAL_TABLET | Freq: Four times a day (QID) | ORAL | 0 refills | Status: DC | PRN

## 2021-10-12 MED ORDER — OXYCODONE-ACETAMINOPHEN 5-325 MG PO TABS: 1.0000 | ORAL_TABLET | Freq: Four times a day (QID) | ORAL | 0 refills | Status: DC | PRN

## 2021-10-12 NOTE — Care Management Obs Status (Signed)
[facility] NOTIFICATION   Patient Details  Name: Renesha Lizama MRN: [id number] Date of Birth: Nov 11, 1951   Medicare Observation Status Notification Given:  Yes    Angelita Ingles, RN 10/12/2021, 10:26 AM

## 2021-10-12 NOTE — Evaluation (Signed)
Physical Therapy Evaluation and Discharge Patient Details Name: April Armstrong MRN: [id number] DOB: Jan 01, 1951 Today's Date: 10/12/2021  History of Present Illness  Pt is a 70 y/o female who presents s/p L4-L5 R TLIF on 10/11/2021. PMH significant for CVA 2016, Arthiritis, Depression, HTN, IBS.   Clinical Impression  Patient evaluated by Physical Therapy with no further acute PT needs identified. All education has been completed and the patient has no further questions. Pt was able to demonstrate transfers and ambulation with gross modified independence and RW for support. Pt was educated on precautions, brace application/wearing schedule, appropriate activity progression, and car transfer. See below for any follow-up Physical Therapy or equipment needs. PT is signing off. Thank you for this referral.        Recommendations for follow up therapy are one component of a multi-disciplinary discharge planning process, led by the attending physician.  Recommendations may be updated based on patient status, additional functional criteria and insurance authorization.  Follow Up Recommendations No PT follow up    Assistance Recommended at Discharge PRN  Functional Status Assessment Patient has had a recent decline in their functional status and demonstrates the ability to make significant improvements in function in a reasonable and predictable amount of time.  Equipment Recommendations  Rolling walker (2 wheels)    Recommendations for Other Services       Precautions / Restrictions Precautions Precautions: Back;Fall Precaution Booklet Issued: Yes (comment) Precaution Comments: Reviewed precautions and compensatory strategies for ADL's Required Braces or Orthoses: Spinal Brace Spinal Brace: Lumbar corset;Applied in sitting position Restrictions Weight Bearing Restrictions: No      Mobility  Bed Mobility Overal bed mobility: Modified Independent             General bed  mobility comments: HOB flat and rails lowered to simulate home environment. No assist required; cues provided for optimal technique.    Transfers Overall transfer level: Modified independent Equipment used: Rolling walker (2 wheels)               General transfer comment: No assist required. Increased time.    Ambulation/Gait Ambulation/Gait assistance: Modified independent (Device/Increase time) Gait Distance (Feet): 300 Feet Assistive device: Rolling walker (2 wheels) Gait Pattern/deviations: Step-through pattern;Decreased stride length;Trunk flexed Gait velocity: Decreased Gait velocity interpretation: 1.31 - 2.62 ft/sec, indicative of limited community ambulator   General Gait Details: VC's for closer walker proximity, improved posture, and forward gaze. No assist required. Trialed no AD and pt was able to ambulate fairly well without UE support however reports she felt more comfortable with the walker.  Stairs            Wheelchair Mobility    Modified Rankin (Stroke Patients Only)       Balance Overall balance assessment: Mild deficits observed, not formally tested                                           Pertinent Vitals/Pain Pain Assessment: Faces Pain Score: 8  Faces Pain Scale: Hurts little more Pain Location: Back, Incision site Pain Descriptors / Indicators: Aching;Discomfort;Grimacing Pain Intervention(s): Limited activity within patient's tolerance;Monitored during session;Repositioned    Home Living Family/patient expects to be discharged to:: Private residence Living Arrangements: Spouse/significant other Available Help at Discharge: Family Type of Home: House Home Access: Level entry       Home Layout: One level Home Equipment:  Shower seat      Prior Function Prior Level of Function : Independent/Modified Independent             Mobility Comments: Pt used no DME ADLs Comments: Independent     Hand  Dominance   Dominant Hand: Right    Extremity/Trunk Assessment   Upper Extremity Assessment Upper Extremity Assessment: Defer to OT evaluation    Lower Extremity Assessment Lower Extremity Assessment: Generalized weakness (Mild; consistent with pre-op diagnosis)    Cervical / Trunk Assessment Cervical / Trunk Assessment: Back Surgery  Communication   Communication: No difficulties  Cognition Arousal/Alertness: Awake/alert Behavior During Therapy: WFL for tasks assessed/performed Overall Cognitive Status: Within Functional Limits for tasks assessed                                          General Comments General comments (skin integrity, edema, etc.): RN present to change dressing. Noted immediate sanguinous drainage through dressing at the bottom.    Exercises     Assessment/Plan    PT Assessment Patient does not need any further PT services  PT Problem List         PT Treatment Interventions      PT Goals (Current goals can be found in the Care Plan section)  Acute Rehab PT Goals Patient Stated Goal: Home this morning PT Goal Formulation: All assessment and education complete, DC therapy    Frequency     Barriers to discharge        Co-evaluation               AM-PAC PT "6 Clicks" Mobility  Outcome Measure Help needed turning from your back to your side while in a flat bed without using bedrails?: None Help needed moving from lying on your back to sitting on the side of a flat bed without using bedrails?: None Help needed moving to and from a bed to a chair (including a wheelchair)?: None Help needed standing up from a chair using your arms (e.g., wheelchair or bedside chair)?: None Help needed to walk in hospital room?: None Help needed climbing 3-5 steps with a railing? : None 6 Click Score: 24    End of Session Equipment Utilized During Treatment: Back brace Activity Tolerance: Patient tolerated treatment well Patient left:  in bed;with call bell/phone within reach;with family/visitor present Nurse Communication: Mobility status PT Visit Diagnosis: Unsteadiness on feet (R26.81);Pain Pain - part of body:  (back)    Time: 0037-0488 PT Time Calculation (min) (ACUTE ONLY): 31 min   Charges:   PT Evaluation $PT Eval Low Complexity: 1 Low PT Treatments $Gait Training: 8-22 mins        Rolinda Roan, PT, DPT Acute Rehabilitation Services Pager: [phone] Office: [phone]   Thelma Comp 10/12/2021, 11:53 AM

## 2021-10-12 NOTE — Anesthesia Postprocedure Evaluation (Signed)
Anesthesia Post Note  Patient: April Armstrong  Procedure(s) Performed: LUMBAR FOUR-FIVE GILL PROCEDURE, RIGHT TRANSFORAMINAL LUMBAR INTERBODY FUSION, CAGE, PEDICLE INSTRUMENTATION, BILATERAL FUSION (Spine Lumbar)     Patient location during evaluation: PACU Anesthesia Type: General Level of consciousness: awake and alert Pain management: pain level controlled Vital Signs Assessment: post-procedure vital signs reviewed and stable Respiratory status: spontaneous breathing, nonlabored ventilation, respiratory function stable and patient connected to nasal cannula oxygen Cardiovascular status: blood pressure returned to baseline and stable Postop Assessment: no apparent nausea or vomiting Anesthetic complications: no   No notable events documented.  Last Vitals:  Vitals:   10/12/21 0318 10/12/21 0733  BP: 104/69 (!) 104/58  Pulse: 78 83  Resp: 18 16  Temp: 36.9 C 36.8 C  SpO2: 90% 93%    Last Pain:  Vitals:   10/12/21 1100  TempSrc:   PainSc: 5                  Jereline Ticer P Aneya Daddona

## 2021-10-12 NOTE — Evaluation (Signed)
Occupational Therapy Evaluation Patient Details Name: April Armstrong MRN: [id number] DOB: January 16, 1951 Today's Date: 10/12/2021   History of Present Illness 70 y.o. F admitted on 11/16 for L4-5 fusion. PMH significant for CVA 2016, Arthiritis, Depression, HTN, HLD, IBS.   Clinical Impression   Pt admitted for concerns listed above. PTA pt reported that she was independent with all ADL's and IADL's including driving and yard work. At this time, pt  presents with increased pain and weakness. Pt requiring RW for stability/balance with all functional mobility. Educated on compensatory strategies for ADL's and is able to complete them with no physical assist. Pt able to doff and don back brace with no difficulties. She has no further OT needs at this time and acute OT will sign off.       Recommendations for follow up therapy are one component of a multi-disciplinary discharge planning process, led by the attending physician.  Recommendations may be updated based on patient status, additional functional criteria and insurance authorization.   Follow Up Recommendations  No OT follow up    Assistance Recommended at Discharge PRN  Functional Status Assessment  Patient has had a recent decline in their functional status and demonstrates the ability to make significant improvements in function in a reasonable and predictable amount of time.  Equipment Recommendations  Other (comment) (RW)    Recommendations for Other Services       Precautions / Restrictions Precautions Precautions: Back Precaution Booklet Issued: Yes (comment) Precaution Comments: Reviewed precautions and compensatory strategies for ADL's Required Braces or Orthoses: Spinal Brace Spinal Brace: Lumbar corset;Applied in sitting position Restrictions Weight Bearing Restrictions: No      Mobility Bed Mobility Overal bed mobility: Modified Independent             General bed mobility comments: Reviewed log roll  technique, pt will need more practice, however reports she will be sleeping in her recliner at home.    Transfers Overall transfer level: Modified independent Equipment used: Rolling walker (2 wheels)               General transfer comment: No assist needed, increased time to steady herself with RW      Balance Overall balance assessment: Mild deficits observed, not formally tested                                         ADL either performed or assessed with clinical judgement   ADL Overall ADL's : Modified independent                                       General ADL Comments: Pt able to complete all ADL's with no difficulties     Vision Baseline Vision/History: 0 No visual deficits Ability to See in Adequate Light: 0 Adequate Patient Visual Report: No change from baseline Vision Assessment?: No apparent visual deficits     Perception     Praxis      Pertinent Vitals/Pain Pain Assessment: 0-10 Pain Score: 8  Pain Location: Back, Surgical site Pain Descriptors / Indicators: Aching;Discomfort;Grimacing Pain Intervention(s): Limited activity within patient's tolerance;Monitored during session;Repositioned     Hand Dominance Right   Extremity/Trunk Assessment Upper Extremity Assessment Upper Extremity Assessment: Overall WFL for tasks assessed   Lower Extremity Assessment Lower Extremity Assessment:  Defer to PT evaluation   Cervical / Trunk Assessment Cervical / Trunk Assessment: Back Surgery   Communication Communication Communication: No difficulties   Cognition Arousal/Alertness: Awake/alert Behavior During Therapy: WFL for tasks assessed/performed Overall Cognitive Status: Within Functional Limits for tasks assessed                                       General Comments       Exercises     Shoulder Instructions      Home Living Family/patient expects to be discharged to:: Private  residence Living Arrangements: Spouse/significant other Available Help at Discharge: Family Type of Home: House Home Access: Level entry     Home Layout: One level     Bathroom Shower/Tub: Occupational psychologist: Standard Bathroom Accessibility: Yes How Accessible: Accessible via walker Home Equipment: Shower seat          Prior Functioning/Environment Prior Level of Function : Independent/Modified Independent             Mobility Comments: Pt used no DME ADLs Comments: Independent        OT Problem List: Decreased strength;Decreased activity tolerance;Impaired balance (sitting and/or standing);Decreased knowledge of use of DME or AE;Pain      OT Treatment/Interventions:      OT Goals(Current goals can be found in the care plan section) Acute Rehab OT Goals Patient Stated Goal: To go home OT Goal Formulation: With patient Time For Goal Achievement: 10/12/21 Potential to Achieve Goals: Good  OT Frequency:     Barriers to D/C:            Co-evaluation              AM-PAC OT "6 Clicks" Daily Activity     Outcome Measure Help from another person eating meals?: None Help from another person taking care of personal grooming?: A Little Help from another person toileting, which includes using toliet, bedpan, or urinal?: A Little Help from another person bathing (including washing, rinsing, drying)?: A Little Help from another person to put on and taking off regular upper body clothing?: None Help from another person to put on and taking off regular lower body clothing?: A Little 6 Click Score: 20   End of Session Equipment Utilized During Treatment: Rolling walker (2 wheels);Back brace Nurse Communication: Mobility status  Activity Tolerance: Patient tolerated treatment well Patient left: in bed;with call bell/phone within reach;with family/visitor present  OT Visit Diagnosis: Unsteadiness on feet (R26.81);Other abnormalities of gait and  mobility (R26.89);Muscle weakness (generalized) (M62.81)                Time: 5625-6389 OT Time Calculation (min): 35 min Charges:  OT General Charges $OT Visit: 1 Visit OT Evaluation $OT Eval Low Complexity: 1 Low OT Treatments $Self Care/Home Management : 8-22 mins  April Armstrong H., OTR/L Acute Rehabilitation  April Armstrong 10/12/2021, 9:01 AM

## 2021-10-12 NOTE — Plan of Care (Signed)
Pt doing well. Pt given D/C instructions with verbal understanding. Rx's were sent to the pharmacy by MD. Pt's incision is clean and dry with no sign of infection. Pt's IV was removed prior to D/C. PT received RW from [employer] per MD order. Pt D/C'd home via wheelchair per MD order. Pt is stable @ D/C and has no other needs at this time. Holli Humbles, RN

## 2021-10-12 NOTE — Progress Notes (Signed)
   Subjective: 1 Day Post-Op Procedure(s) (LRB): LUMBAR FOUR-FIVE GILL PROCEDURE, RIGHT TRANSFORAMINAL LUMBAR INTERBODY FUSION, CAGE, PEDICLE INSTRUMENTATION, BILATERAL FUSION (N/A) Patient reports pain as mild and moderate.  Incisional pain. No leg pain.   Objective: Vital signs in last 24 hours: Temp:  [97.8 F (36.6 C)-98.4 F (36.9 C)] 98.2 F (36.8 C) (11/17 0733) Pulse Rate:  [70-94] 83 (11/17 0733) Resp:  [11-20] 16 (11/17 0733) BP: (99-128)/(55-71) 104/58 (11/17 0733) SpO2:  [90 %-98 %] 93 % (11/17 0733) Weight:  [88.5 kg] 88.5 kg (11/16 1042)  Intake/Output from previous day: 11/16 0701 - 11/17 0700 In: 1000 [I.V.:1000] Out: 400 [Urine:250; Blood:150] Intake/Output this shift: No intake/output data recorded.  Recent Labs    10/09/21 1415  HGB 14.1   Recent Labs    10/09/21 1415  WBC 8.2  RBC 4.64  HCT 43.4  PLT 247   Recent Labs    10/09/21 1415  NA 138  K 3.7  CL 102  CO2 27  BUN 7*  CREATININE 0.78  GLUCOSE 106*  CALCIUM 9.1   No results for input(s): LABPT, INR in the last 72 hours.  Neurologically intact DG Lumbar Spine 2-3 Views  Result Date: 10/11/2021 CLINICAL DATA:  L4-5 PLIF EXAM: LUMBAR SPINE - 2-3 VIEW COMPARISON:  None. FLUOROSCOPY TIME:  Radiation Exposure Index (as provided by the fluoroscopic device): 29.16 mGy If the device does not provide the exposure index: Fluoroscopy Time:  40 seconds Number of Acquired Images:  2 FINDINGS: Interbody fusion is noted at L4-5. Pedicle screw placement with posterior fixation is noted. Residual anterolisthesis of L4 on L5 is noted. IMPRESSION: L4-5 fusion as described. Electronically Signed   By: Inez Catalina M.D.   On: 10/11/2021 15:40   DG C-Arm 1-60 Min-No Report  Result Date: 10/11/2021 Fluoroscopy was utilized by the requesting physician.  No radiographic interpretation.   DG C-Arm 1-60 Min-No Report  Result Date: 10/11/2021 Fluoroscopy was utilized by the requesting physician.  No  radiographic interpretation.   DG C-Arm 1-60 Min-No Report  Result Date: 10/11/2021 Fluoroscopy was utilized by the requesting physician.  No radiographic interpretation.    Assessment/Plan: 1 Day Post-Op Procedure(s) (LRB): LUMBAR FOUR-FIVE GILL PROCEDURE, RIGHT TRANSFORAMINAL LUMBAR INTERBODY FUSION, CAGE, PEDICLE INSTRUMENTATION, BILATERAL FUSION (N/A) Up with therapy this AM. Discharge home . Office next week   April Armstrong 10/12/2021, 7:51 AM

## 2021-10-12 NOTE — Care Management CC44 (Signed)
Condition Code 44 Documentation Completed  Patient Details  Name: April Armstrong MRN: [id number] Date of Birth: 07-24-1951   Condition Code 44 given:  Yes Patient signature on Condition Code 44 notice:  Yes Documentation of 2 MD's agreement:  Yes Code 44 added to claim:  Yes    Angelita Ingles, RN 10/12/2021, 10:26 AM

## 2021-10-12 NOTE — Discharge Instructions (Addendum)
Walk daily.  Gradually increase her distance as tolerated.  Goal is to work your way up to 1 to 2 miles over several weeks.  Avoid bending and lifting.  You can apply a brace when up and walking.  See Dr. Lorin Mercy in about 1 week.  You can restart your aspirin on Monday 1 daily.

## 2021-10-16 ENCOUNTER — Encounter: Payer: Self-pay | Admitting: Orthopaedic Surgery

## 2021-10-16 ENCOUNTER — Ambulatory Visit: Payer: Self-pay

## 2021-10-16 ENCOUNTER — Other Ambulatory Visit: Payer: Self-pay

## 2021-10-16 ENCOUNTER — Ambulatory Visit (INDEPENDENT_AMBULATORY_CARE_PROVIDER_SITE_OTHER): Payer: Medicare Other | Admitting: Orthopaedic Surgery

## 2021-10-16 ENCOUNTER — Encounter: Payer: Medicare Other | Admitting: Orthopaedic Surgery

## 2021-10-16 VITALS — BP 109/68 | Ht 62.0 in | Wt 195.0 lb

## 2021-10-16 DIAGNOSIS — Z981 Arthrodesis status: Secondary | ICD-10-CM

## 2021-10-16 MED ORDER — OXYCODONE-ACETAMINOPHEN 5-325 MG PO TABS: 1.0000 | ORAL_TABLET | Freq: Four times a day (QID) | ORAL | 0 refills | Status: AC | PRN

## 2021-10-16 MED ORDER — GABAPENTIN 100 MG PO CAPS: ORAL_CAPSULE | ORAL | 1 refills | Status: DC

## 2021-10-16 NOTE — Progress Notes (Signed)
Post-Op Visit Note   Patient: April Armstrong           Date of Birth: 1951-04-11           MRN: [id number] Visit Date: 10/16/2021 PCP: Aura Dials, PA-C   Assessment & Plan: Post L4-5 fusion right TLIF with cage.  Patient was doing great at home and then started having increased pain.  She did have Marcaine injected postop and was ambulating the halls easily.  Last 2 days she has had significant increased back pain and right buttocks hip pain some pain in her right leg.  Anterior tib gastrocsoleus is strong dorsal and plantar foot sensation of her foot is normal.  I asked her to come in to get x-rays which showed good position of cage and pedicle screws no cage backout bone graft looks good.  We will start a little bit low-dose Neurontin 100 mg at night for 1 week and then she can go to 1 tablet twice a day.  Patient's had a dressing on since this morning and dressing is pulled back and there is no drainage and none on the undersurface of the dressing.  Chief Complaint:  Chief Complaint  Patient presents with   Lower Back - Routine Post Op    10/11/2021 L4-5 GILL procedure, Right TLIF   Visit Diagnoses:  1. Status post lumbar spinal fusion     Plan: Patient can stop the trazodone at night.  We will add some Neurontin 100 mg at night she can increase this after week to 2 tablets at night.  Return 9 days for wound check and possible staple removal.  Follow-Up Instructions: No follow-ups on file.   Orders:  Orders Placed This Encounter  Procedures   XR Lumbar Spine 2-3 Views   Meds ordered this encounter  Medications   gabapentin (NEURONTIN) 100 MG capsule    Sig: Take one at night for one week then one po bid    Dispense:  60 capsule    Refill:  1    Imaging: No results found.  PMFS History: Patient Active Problem List   Diagnosis Date Noted   S/P lumbar fusion 10/11/2021   Spinal stenosis of lumbar region 02/28/2021   Trigger finger, left ring finger  03/10/2018   BPPV (benign paroxysmal positional vertigo) 10/22/2016   Anxiety state 06/22/2016   Tachycardia 04/25/2016   Bilateral occipital neuralgia    Cryptogenic stroke ([place]) 04/13/2016   Cerebrovascular accident (CVA) due to thrombosis of right middle cerebral artery ([place]) 04/13/2016   Cerebral infarction ([place])    Thyroid activity decreased    Depression    Hypothyroidism    GERD (gastroesophageal reflux disease)    Hyperlipidemia    Hypertension    IBS (irritable bowel syndrome)    Past Medical History:  Diagnosis Date   Arthritis    Depression    GERD (gastroesophageal reflux disease)    Hypercholesterolemia    Hypertension    Hypothyroidism    IBS (irritable bowel syndrome)    Stroke ([place])    Thyroid disease     Family History  Problem Relation Age of Onset   Dementia Mother    Hypercholesterolemia Mother    Heart disease Father    Stroke Father    Heart disease Brother    Congestive Heart Failure Sister    Breast cancer Paternal Aunt     Past Surgical History:  Procedure Laterality Date   "[place]ie [place]"  2019  ABDOMINAL HYSTERECTOMY     APPENDECTOMY     CESAREAN SECTION     CHOLECYSTECTOMY     EP IMPLANTABLE DEVICE N/A 12/28/2016   Procedure: Loop Recorder Insertion;  Surgeon: Evans Lance, MD;  Location: [facility];  Service: Cardiovascular;  Laterality: N/A;   EYE SURGERY Bilateral    cataract removal   THYROIDECTOMY     TRIGGER FINGER RELEASE     Social History   Occupational History   Not on file  Tobacco Use   Smoking status: Never   Smokeless tobacco: Former    Types: Nurse, children's Use: Never used  Substance and Sexual Activity   Alcohol use: No   Drug use: No   Sexual activity: Not on file

## 2021-10-16 NOTE — Discharge Summary (Signed)
Patient ID: April Armstrong MRN: [id number] DOB/AGE: 72/07/1951 72 y.o.  Admit date: 10/11/2021 Discharge date: 10/12/2021  Admission Diagnoses:  Principal Problem:   S/P lumbar fusion Active Problems:   Spinal stenosis of lumbar region   Discharge Diagnoses:  Principal Problem:   S/P lumbar fusion Active Problems:   Spinal stenosis of lumbar region  status post Procedure(s): LUMBAR FOUR-FIVE GILL PROCEDURE, RIGHT TRANSFORAMINAL LUMBAR INTERBODY FUSION, CAGE, PEDICLE INSTRUMENTATION, BILATERAL FUSION  Past Medical History:  Diagnosis Date   Arthritis    Depression    GERD (gastroesophageal reflux disease)    Hypercholesterolemia    Hypertension    Hypothyroidism    IBS (irritable bowel syndrome)    Stroke ([place])    Thyroid disease     Surgeries: Procedure(s): LUMBAR FOUR-FIVE GILL PROCEDURE, RIGHT TRANSFORAMINAL LUMBAR INTERBODY FUSION, CAGE, PEDICLE INSTRUMENTATION, BILATERAL FUSION on 10/11/2021   Consultants:   Discharged Condition: Improved  Hospital Course: April Armstrong is an 70 y.o. female who was admitted 10/11/2021 for operative treatment of lumbar stenosis. Patient failed conservative treatments (please see the history and physical for the specifics) and had severe unremitting pain that affects sleep, daily activities and work/hobbies. After pre-op clearance, the patient was taken to the operating room on 10/11/2021 and underwent  Procedure(s): LUMBAR FOUR-FIVE GILL PROCEDURE, RIGHT TRANSFORAMINAL LUMBAR INTERBODY FUSION, CAGE, PEDICLE INSTRUMENTATION, BILATERAL FUSION.    Patient was given perioperative antibiotics:  Anti-infectives (From admission, onward)    Start     Dose/Rate Route Frequency Ordered Stop   10/11/21 1045  ceFAZolin (ANCEF) IVPB 2g/100 mL premix        2 g 200 mL/hr over 30 Minutes Intravenous On call to O.R. 10/11/21 1037 10/11/21 1300        Patient was given sequential compression devices and early ambulation to  prevent DVT.   Patient benefited maximally from hospital stay and there were no complications. At the time of discharge, the patient was urinating/moving their bowels without difficulty, tolerating a regular diet, pain is controlled with oral pain medications and they have been cleared by PT/OT.   Recent vital signs: No data found.   Recent laboratory studies: No results for input(s): WBC, HGB, HCT, PLT, NA, K, CL, CO2, BUN, CREATININE, GLUCOSE, INR, CALCIUM in the last 72 hours.  Invalid input(s): PT, 2   Discharge Medications:   Allergies as of 10/12/2021   No Known Allergies      Medication List     STOP taking these medications    aspirin 325 MG tablet       TAKE these medications    atorvastatin 80 MG tablet Commonly known as: LIPITOR Take 1 tablet (80 mg total) by mouth at bedtime.   baclofen 10 MG tablet Commonly known as: LIORESAL TAKE ONE TABLET BY MOUTH EVERY 8 HOURS AS NEEDED FOR MUSCLE SPASMS   buPROPion 300 MG 24 hr tablet Commonly known as: WELLBUTRIN XL Take 1 tablet (300 mg total) by mouth daily.   calcium carbonate 1500 (600 Ca) MG Tabs tablet Commonly known as: OSCAL Take 600 mg of elemental calcium by mouth 2 (two) times daily with a meal.   diclofenac sodium 1 % Gel Commonly known as: VOLTAREN APPLY 2-4 GRAMS TO LARGE JOINT AREA UP TO FOUR TIMES A DAY AS NEEDED   lamoTRIgine 100 MG tablet Commonly known as: LAMICTAL Take 1 tablet (100 mg total) by mouth daily.   levothyroxine 88 MCG tablet Commonly known as: SYNTHROID Take 88 mcg  by mouth daily before breakfast.   loratadine 10 MG tablet Commonly known as: CLARITIN Take 10 mg by mouth daily as needed for allergies.   omeprazole 40 MG capsule Commonly known as: PRILOSEC Take 40 mg by mouth daily.   oxyCODONE-acetaminophen 5-325 MG tablet Commonly known as: Percocet Take 1-2 tablets by mouth every 6 (six) hours as needed for severe pain.   Restasis 0.05 % ophthalmic  emulsion Generic drug: cycloSPORINE Place 1 drop into both eyes 2 (two) times daily.   sertraline 100 MG tablet Commonly known as: ZOLOFT Take 1 tablet (100 mg total) by mouth daily.   spironolactone 25 MG tablet Commonly known as: ALDACTONE Take 25 mg by mouth daily.   tiZANidine 4 MG tablet Commonly known as: ZANAFLEX Take 1 tablet (4 mg total) by mouth every 6 (six) hours as needed for muscle spasms.   traZODone 150 MG tablet Commonly known as: DESYREL Take 0.5 tablets (75 mg total) by mouth at bedtime.   Vitamin D 50 MCG (2000 UT) tablet Take 2,000 Units by mouth daily.        Diagnostic Studies: DG Lumbar Spine 2-3 Views  Result Date: 10/11/2021 CLINICAL DATA:  L4-5 PLIF EXAM: LUMBAR SPINE - 2-3 VIEW COMPARISON:  None. FLUOROSCOPY TIME:  Radiation Exposure Index (as provided by the fluoroscopic device): 29.16 mGy If the device does not provide the exposure index: Fluoroscopy Time:  40 seconds Number of Acquired Images:  2 FINDINGS: Interbody fusion is noted at L4-5. Pedicle screw placement with posterior fixation is noted. Residual anterolisthesis of L4 on L5 is noted. IMPRESSION: L4-5 fusion as described. Electronically Signed   By: Inez Catalina M.D.   On: 10/11/2021 15:40   DG C-Arm 1-60 Min-No Report  Result Date: 10/11/2021 Fluoroscopy was utilized by the requesting physician.  No radiographic interpretation.   DG C-Arm 1-60 Min-No Report  Result Date: 10/11/2021 Fluoroscopy was utilized by the requesting physician.  No radiographic interpretation.   DG C-Arm 1-60 Min-No Report  Result Date: 10/11/2021 Fluoroscopy was utilized by the requesting physician.  No radiographic interpretation.    Discharge Instructions     Incentive spirometry RT   Complete by: As directed         Follow-up Information     Marybelle Killings, MD Follow up in 1 week(s).   Specialty: Orthopedic Surgery Contact information: [address] [place]  [address] [phone]                 Discharge Plan:  discharge to home  Disposition:     Signed: Benjiman Core  10/16/2021, 7:19 AM

## 2021-10-17 ENCOUNTER — Encounter: Payer: Medicare Other | Admitting: Orthopaedic Surgery

## 2021-10-24 ENCOUNTER — Ambulatory Visit (INDEPENDENT_AMBULATORY_CARE_PROVIDER_SITE_OTHER): Payer: Medicare Other | Admitting: Orthopaedic Surgery

## 2021-10-24 ENCOUNTER — Encounter: Payer: Self-pay | Admitting: Orthopaedic Surgery

## 2021-10-24 ENCOUNTER — Other Ambulatory Visit: Payer: Self-pay

## 2021-10-24 VITALS — BP 108/69 | Ht 62.0 in | Wt 195.0 lb

## 2021-10-24 DIAGNOSIS — Z981 Arthrodesis status: Secondary | ICD-10-CM

## 2021-10-24 MED ORDER — OXYCODONE-ACETAMINOPHEN 5-325 MG PO TABS: 1.0000 | ORAL_TABLET | Freq: Three times a day (TID) | ORAL | 0 refills | Status: DC | PRN

## 2021-10-24 NOTE — Progress Notes (Signed)
Post-Op Visit Note   Patient: April Armstrong           Date of Birth: May 08, 1951           MRN: [id number] Visit Date: 10/24/2021 PCP: Aura Dials, PA-C   Assessment & Plan: Post L4-5 TLIF on the right.  Still has a little trouble sleeping she is walking better.  Incision looks good staples are removed.  Neurontin is given her some relief.  She can increase it to 3 pills a day taking 2 at night.  Chief Complaint:  Chief Complaint  Patient presents with   Lower Back - Follow-up    10/11/2021 L4-5 GILL procedure, Right TLIF   Visit Diagnoses:  1. S/P lumbar fusion     Plan: Staples removed incision looks good recheck 4 weeks.  She will call when she runs low on the gabapentin and we can increase her prescription to either 300 mg at night or she can take the 100 mg tablet 1 in the morning and 2 at night her choice.  She will let us know about this.  Follow-Up Instructions: Return in about 4 weeks (around 11/21/2021).   Orders:  No orders of the defined types were placed in this encounter.  Meds ordered this encounter  Medications   oxyCODONE-acetaminophen (PERCOCET/ROXICET) 5-325 MG tablet    Sig: Take 1 tablet by mouth every 8 (eight) hours as needed for severe pain.    Dispense:  20 tablet    Refill:  0    Imaging: No results found.  PMFS History: Patient Active Problem List   Diagnosis Date Noted   S/P lumbar fusion 10/11/2021   Trigger finger, left ring finger 03/10/2018   BPPV (benign paroxysmal positional vertigo) 10/22/2016   Anxiety state 06/22/2016   Tachycardia 04/25/2016   Bilateral occipital neuralgia    Cryptogenic stroke ([place]) 04/13/2016   Cerebrovascular accident (CVA) due to thrombosis of right middle cerebral artery ([place]) 04/13/2016   Cerebral infarction ([place])    Thyroid activity decreased    Depression    Hypothyroidism    GERD (gastroesophageal reflux disease)    Hyperlipidemia    Hypertension    IBS (irritable bowel syndrome)     Past Medical History:  Diagnosis Date   Arthritis    Depression    GERD (gastroesophageal reflux disease)    Hypercholesterolemia    Hypertension    Hypothyroidism    IBS (irritable bowel syndrome)    Stroke ([place])    Thyroid disease     Family History  Problem Relation Age of Onset   Dementia Mother    Hypercholesterolemia Mother    Heart disease Father    Stroke Father    Heart disease Brother    Congestive Heart Failure Sister    Breast cancer Paternal Aunt     Past Surgical History:  Procedure Laterality Date   "Tummy Tuck"  2019   ABDOMINAL HYSTERECTOMY     APPENDECTOMY     CESAREAN SECTION     CHOLECYSTECTOMY     EP IMPLANTABLE DEVICE N/A 12/28/2016   Procedure: Loop Recorder Insertion;  Surgeon: Evans Lance, MD;  Location: [facility];  Service: Cardiovascular;  Laterality: N/A;   EYE SURGERY Bilateral    cataract removal   THYROIDECTOMY     TRIGGER FINGER RELEASE     Social History   Occupational History   Not on file  Tobacco Use   Smoking status: Never   Smokeless tobacco:  Former    Types: Nurse, children's Use: Never used  Substance and Sexual Activity   Alcohol use: No   Drug use: No   Sexual activity: Not on file

## 2021-11-21 ENCOUNTER — Encounter: Payer: Self-pay | Admitting: Orthopaedic Surgery

## 2021-11-21 ENCOUNTER — Ambulatory Visit (INDEPENDENT_AMBULATORY_CARE_PROVIDER_SITE_OTHER): Payer: Medicare Other | Admitting: Orthopaedic Surgery

## 2021-11-21 ENCOUNTER — Ambulatory Visit (INDEPENDENT_AMBULATORY_CARE_PROVIDER_SITE_OTHER): Payer: Medicare Other

## 2021-11-21 ENCOUNTER — Other Ambulatory Visit: Payer: Self-pay

## 2021-11-21 VITALS — BP 102/67 | HR 73 | Ht 62.0 in | Wt 195.0 lb

## 2021-11-21 DIAGNOSIS — Z981 Arthrodesis status: Secondary | ICD-10-CM

## 2021-11-21 NOTE — Progress Notes (Signed)
Post-Op Visit Note   Patient: April Armstrong           Date of Birth: 27-Jul-1951           MRN: [id number] Visit Date: 11/21/2021 PCP: Aura Dials, PA-C   Assessment & Plan: Post the single level fusion anterolisthesis stenosis L4-5.  X-rays are stable the cage might of backed out 1 mm from Intra-Op.  Some of this may be projection.  She is walking faster does not have claudication symptoms she has been to the grocery store and other stores.  I will check her back again in 2 months and we will repeat 2 view x-ray on return in 2 months.  She can drive and can bend over if she needs to but will avoid doing twisting lifting type activities for another couple months.  Patient states she only took 1 pain pill.  Chief Complaint:  Chief Complaint  Patient presents with   Lower Back - Follow-up    10/11/2021 L4-5 GILL procedure, Right TLIF   Visit Diagnoses:  1. S/P lumbar fusion     Plan: ROV 2 months. Repeat lumbar AP and lateral xray on return  Follow-Up Instructions: No follow-ups on file.   Orders:  Orders Placed This Encounter  Procedures   XR Lumbar Spine 2-3 Views   No orders of the defined types were placed in this encounter.   Imaging: No results found.  PMFS History: Patient Active Problem List   Diagnosis Date Noted   S/P lumbar fusion 10/11/2021   Trigger finger, left ring finger 03/10/2018   BPPV (benign paroxysmal positional vertigo) 10/22/2016   Anxiety state 06/22/2016   Tachycardia 04/25/2016   Bilateral occipital neuralgia    Cryptogenic stroke ([place]) 04/13/2016   Cerebrovascular accident (CVA) due to thrombosis of right middle cerebral artery ([place]) 04/13/2016   Cerebral infarction ([place])    Thyroid activity decreased    Depression    Hypothyroidism    GERD (gastroesophageal reflux disease)    Hyperlipidemia    Hypertension    IBS (irritable bowel syndrome)    Past Medical History:  Diagnosis Date   Arthritis    Depression    GERD  (gastroesophageal reflux disease)    Hypercholesterolemia    Hypertension    Hypothyroidism    IBS (irritable bowel syndrome)    Stroke ([place])    Thyroid disease     Family History  Problem Relation Age of Onset   Dementia Mother    Hypercholesterolemia Mother    Heart disease Father    Stroke Father    Heart disease Brother    Congestive Heart Failure Sister    Breast cancer Paternal Aunt     Past Surgical History:  Procedure Laterality Date   "Tummy Tuck"  2019   ABDOMINAL HYSTERECTOMY     APPENDECTOMY     CESAREAN SECTION     CHOLECYSTECTOMY     EP IMPLANTABLE DEVICE N/A 12/28/2016   Procedure: Loop Recorder Insertion;  Surgeon: Evans Lance, MD;  Location: [facility];  Service: Cardiovascular;  Laterality: N/A;   EYE SURGERY Bilateral    cataract removal   THYROIDECTOMY     TRIGGER FINGER RELEASE     Social History   Occupational History   Not on file  Tobacco Use   Smoking status: Never   Smokeless tobacco: Former    Types: Nurse, children's Use: Never used  Substance and Sexual Activity  Alcohol use: No   Drug use: No   Sexual activity: Not on file

## 2021-11-28 ENCOUNTER — Telehealth: Payer: Self-pay | Admitting: Orthopaedic Surgery

## 2021-11-28 ENCOUNTER — Telehealth: Payer: Self-pay

## 2021-11-28 NOTE — Telephone Encounter (Signed)
Pt stated

## 2021-11-28 NOTE — Telephone Encounter (Signed)
Patient called states she fell Friday tripped hit knee  and landed on right side. She's planning to go to [place] but is waiting on Dr. Lorin Mercy to okay her going. She would like to know if she needs appt or can she travel? Husband will drive. Would like CB to discuss.    CB [phone]

## 2021-11-29 NOTE — Telephone Encounter (Signed)
I called patient's husband and advised.  

## 2021-12-03 ENCOUNTER — Other Ambulatory Visit: Payer: Self-pay | Admitting: Adult Health

## 2021-12-03 DIAGNOSIS — F331 Major depressive disorder, recurrent, moderate: Secondary | ICD-10-CM

## 2021-12-03 DIAGNOSIS — F411 Generalized anxiety disorder: Secondary | ICD-10-CM

## 2021-12-04 NOTE — Telephone Encounter (Signed)
Please schedule appt

## 2021-12-04 NOTE — Telephone Encounter (Signed)
Last seen 4/12 due back 7/12

## 2021-12-08 NOTE — Telephone Encounter (Signed)
Patient scheduled for 2/23

## 2022-01-03 ENCOUNTER — Other Ambulatory Visit: Payer: Self-pay | Admitting: Adult Health

## 2022-01-03 DIAGNOSIS — F331 Major depressive disorder, recurrent, moderate: Secondary | ICD-10-CM

## 2022-01-03 DIAGNOSIS — F411 Generalized anxiety disorder: Secondary | ICD-10-CM

## 2022-01-18 ENCOUNTER — Other Ambulatory Visit: Payer: Self-pay

## 2022-01-18 ENCOUNTER — Ambulatory Visit (INDEPENDENT_AMBULATORY_CARE_PROVIDER_SITE_OTHER): Payer: Medicare Other | Admitting: Adult Health

## 2022-01-18 ENCOUNTER — Encounter: Payer: Self-pay | Admitting: Adult Health

## 2022-01-18 DIAGNOSIS — F411 Generalized anxiety disorder: Secondary | ICD-10-CM

## 2022-01-18 DIAGNOSIS — F410 Panic disorder [episodic paroxysmal anxiety] without agoraphobia: Secondary | ICD-10-CM | POA: Diagnosis not present

## 2022-01-18 DIAGNOSIS — F331 Major depressive disorder, recurrent, moderate: Secondary | ICD-10-CM

## 2022-01-18 DIAGNOSIS — G4700 Insomnia, unspecified: Secondary | ICD-10-CM | POA: Diagnosis not present

## 2022-01-18 MED ORDER — TRAZODONE HCL 150 MG PO TABS: 150.0000 mg | ORAL_TABLET | Freq: Every day | ORAL | 3 refills | Status: DC

## 2022-01-18 MED ORDER — BUPROPION HCL ER (XL) 300 MG PO TB24: 300.0000 mg | ORAL_TABLET | Freq: Every day | ORAL | 3 refills | Status: DC

## 2022-01-18 MED ORDER — LAMOTRIGINE 100 MG PO TABS: 100.0000 mg | ORAL_TABLET | Freq: Every day | ORAL | 3 refills | Status: DC

## 2022-01-18 MED ORDER — SERTRALINE HCL 100 MG PO TABS: 100.0000 mg | ORAL_TABLET | Freq: Every day | ORAL | 3 refills | Status: DC

## 2022-01-18 NOTE — Progress Notes (Signed)
April Armstrong [id number] 02-Sep-1951 71 y.o.  Subjective:   Patient ID:  April Armstrong is a 71 y.o. (DOB 1950/12/30) female.  Chief Complaint: No chief complaint on file.   HPI April Armstrong presents to the office today for follow-up of MDD, GAD, insomnia, and panic attacks.   Describes mood today as "ok". Pleasant. Mood symptoms - denies depression, anxiety, and irritability. Denies panic attacks. Stating "I feel good". Recent back surgery. Feels like medications continue to work well for her. She and husband doing well. Stable interest and motivation. Taking medications as prescribed.  Energy levels decreased. Active, has a regular exercise routine.  Enjoys some usual interests and activities. Married. Lives with husband of 18 years and 3 dogs. Has 3 daughters and 8 grandchildren. Has 3 chickens and 4 ducks. Appetite adequate. Weight stable. Sleeps well most nights. Averages 5 hours. Focus and concentration stable. Completing tasks. Managing aspects of household.  Denies SI or HI.  Denies AH or VH.   Flowsheet Row Pre-Admission Testing 60 from 10/09/2021 in [facility] PREADMISSION TESTING ED from 03/04/2021 in [facility] No Risk No Risk        Review of Systems:  Review of Systems  Musculoskeletal:  Negative for gait problem.  Neurological:  Negative for tremors.  Psychiatric/Behavioral:         Please refer to HPI   Medications: I have reviewed the patient's current medications.  Current Outpatient Medications  Medication Sig Dispense Refill   atorvastatin (LIPITOR) 80 MG tablet Take 1 tablet (80 mg total) by mouth at bedtime. 30 tablet 0   baclofen (LIORESAL) 10 MG tablet TAKE ONE TABLET BY MOUTH EVERY 8 HOURS AS NEEDED FOR MUSCLE SPASMS (Patient not taking: Reported on 10/05/2021) 60 tablet 0   buPROPion (WELLBUTRIN XL) 300 MG 24 hr tablet Take 1 tablet (300 mg total) by mouth  daily. 90 tablet 3   calcium carbonate (OSCAL) 1500 (600 Ca) MG TABS tablet Take 600 mg of elemental calcium by mouth 2 (two) times daily with a meal.     Cholecalciferol (VITAMIN D) 50 MCG (2000 UT) tablet Take 2,000 Units by mouth daily.     diclofenac sodium (VOLTAREN) 1 % GEL APPLY 2-4 GRAMS TO LARGE JOINT AREA UP TO FOUR TIMES A DAY AS NEEDED 2 Tube 3   gabapentin (NEURONTIN) 100 MG capsule Take one at night for one week then one po bid 60 capsule 1   lamoTRIgine (LAMICTAL) 100 MG tablet Take 1 tablet (100 mg total) by mouth daily. 900 tablet 3   levothyroxine (SYNTHROID) 88 MCG tablet Take 88 mcg by mouth daily before breakfast.     loratadine (CLARITIN) 10 MG tablet Take 10 mg by mouth daily as needed for allergies.     omeprazole (PRILOSEC) 40 MG capsule Take 40 mg by mouth daily.      oxyCODONE-acetaminophen (PERCOCET) 5-325 MG tablet Take 1-2 tablets by mouth every 6 (six) hours as needed for severe pain. (Patient not taking: Reported on 11/21/2021) 40 tablet 0   oxyCODONE-acetaminophen (PERCOCET/ROXICET) 5-325 MG tablet Take 1 tablet by mouth every 8 (eight) hours as needed for severe pain. 20 tablet 0   OZEMPIC, 0.25 OR 0.5 MG/DOSE, 2 MG/1.5ML SOPN Inject into the skin.     RESTASIS 0.05 % ophthalmic emulsion Place 1 drop into both eyes 2 (two) times daily.      sertraline (ZOLOFT) 100 MG tablet Take 1 tablet (  100 mg total) by mouth daily. 90 tablet 3   spironolactone (ALDACTONE) 25 MG tablet Take 25 mg by mouth daily.      tiZANidine (ZANAFLEX) 4 MG tablet Take 1 tablet (4 mg total) by mouth every 6 (six) hours as needed for muscle spasms. 30 tablet 0   traZODone (DESYREL) 150 MG tablet Take 1 tablet (150 mg total) by mouth at bedtime. 90 tablet 3   No current facility-administered medications for this visit.    Medication Side Effects: None  Allergies: No Known Allergies  Past Medical History:  Diagnosis Date   Arthritis    Depression    GERD (gastroesophageal reflux  disease)    Hypercholesterolemia    Hypertension    Hypothyroidism    IBS (irritable bowel syndrome)    Stroke [facility])    Thyroid disease     Past Medical History, Surgical history, Social history, and Family history were reviewed and updated as appropriate.   Please see review of systems for further details on the patient's review from today.   Objective:   Physical Exam:  There were no vitals taken for this visit.  Physical Exam Constitutional:      General: She is not in acute distress. Musculoskeletal:        General: No deformity.  Neurological:     Mental Status: She is alert and oriented to person, place, and time.     Coordination: Coordination normal.  Psychiatric:        Attention and Perception: Attention and perception normal. She does not perceive auditory or visual hallucinations.        Mood and Affect: Mood normal. Mood is not anxious or depressed. Affect is not labile, blunt, angry or inappropriate.        Speech: Speech normal.        Behavior: Behavior normal.        Thought Content: Thought content normal. Thought content is not paranoid or delusional. Thought content does not include homicidal or suicidal ideation. Thought content does not include homicidal or suicidal plan.        Cognition and Memory: Cognition and memory normal.        Judgment: Judgment normal.     Comments: Insight intact    Lab Review:     Component Value Date/Time   NA 138 10/09/2021 1415   K 3.7 10/09/2021 1415   CL 102 10/09/2021 1415   CO2 27 10/09/2021 1415   GLUCOSE 106 (H) 10/09/2021 1415   BUN 7 (L) 10/09/2021 1415   CREATININE 0.78 10/09/2021 1415   CALCIUM 9.1 10/09/2021 1415   GFRNONAA >60 10/09/2021 1415   GFRAA >60 04/12/2016 1925       Component Value Date/Time   WBC 8.2 10/09/2021 1415   RBC 4.64 10/09/2021 1415   HGB 14.1 10/09/2021 1415   HCT 43.4 10/09/2021 1415   PLT 247 10/09/2021 1415   MCV 93.5 10/09/2021 1415   MCH 30.4 10/09/2021 1415   MCHC  32.5 10/09/2021 1415   RDW 12.9 10/09/2021 1415    No results found for: POCLITH, LITHIUM   No results found for: PHENYTOIN, PHENOBARB, VALPROATE, CBMZ   .res Assessment: Plan:    Plan:  1. Lamictal 100mg  daily 2. Wellbutrin XL 300mg  daily 3. Zoloft 100mg  daily 4. Trazadone 150mg  daily - taking 75mg  at hs  RTC 3 months  Patient advised to contact office with any questions, adverse effects, or acute worsening in signs and symptoms.  Counseled patient  regarding potential benefits, risks, and side effects of Lamictal to include potential risk of Stevens-Johnson syndrome. Advised patient to stop taking Lamictal and contact office immediately if rash develops and to seek urgent medical attention if rash is severe and/or spreading quickly.  Discussed potential benefits, risk, and side effects of benzodiazepines to include potential risk of tolerance and dependence, as well as possible drowsiness.  Advised patient not to drive if experiencing drowsiness and to take lowest possible effective dose to minimize risk of dependence and tolerance.  Diagnoses and all orders for this visit:  Panic attacks  Generalized anxiety disorder -     buPROPion (WELLBUTRIN XL) 300 MG 24 hr tablet; Take 1 tablet (300 mg total) by mouth daily. -     lamoTRIgine (LAMICTAL) 100 MG tablet; Take 1 tablet (100 mg total) by mouth daily. -     sertraline (ZOLOFT) 100 MG tablet; Take 1 tablet (100 mg total) by mouth daily. -     traZODone (DESYREL) 150 MG tablet; Take 1 tablet (150 mg total) by mouth at bedtime.  Major depressive disorder, recurrent episode, moderate (HCC) -     buPROPion (WELLBUTRIN XL) 300 MG 24 hr tablet; Take 1 tablet (300 mg total) by mouth daily. -     lamoTRIgine (LAMICTAL) 100 MG tablet; Take 1 tablet (100 mg total) by mouth daily. -     sertraline (ZOLOFT) 100 MG tablet; Take 1 tablet (100 mg total) by mouth daily. -     traZODone (DESYREL) 150 MG tablet; Take 1 tablet (150 mg total) by  mouth at bedtime.  Insomnia, unspecified type     Please see After Visit Summary for patient specific instructions.  Future Appointments  Date Time Provider [place]  01/23/2022  1:00 PM Marybelle Killings, MD [facility] None    No orders of the defined types were placed in this encounter.   -------------------------------

## 2022-01-23 ENCOUNTER — Ambulatory Visit: Payer: Medicare Other | Admitting: Orthopaedic Surgery

## 2022-02-28 ENCOUNTER — Encounter: Payer: Self-pay | Admitting: Orthopaedic Surgery

## 2022-02-28 ENCOUNTER — Ambulatory Visit (INDEPENDENT_AMBULATORY_CARE_PROVIDER_SITE_OTHER): Payer: Medicare Other | Admitting: Orthopaedic Surgery

## 2022-02-28 ENCOUNTER — Ambulatory Visit (INDEPENDENT_AMBULATORY_CARE_PROVIDER_SITE_OTHER): Payer: Medicare Other

## 2022-02-28 DIAGNOSIS — Z981 Arthrodesis status: Secondary | ICD-10-CM

## 2022-02-28 NOTE — Progress Notes (Signed)
? ?Office Visit Note ?  ?Patient: April Armstrong           ?Date of Birth: 04-11-1951           ?MRN: [id number] ?Visit Date: 02/28/2022 ?             ?Requested by: Aura Dials, PA-C ?[address] ?[place],  [place] [address] ?PCP: Aura Dials, PA-C ? ? ?Assessment & Plan: ?Visit Diagnoses:  ?1. S/P lumbar fusion   ? ? ?Plan: X-rays look good she can discontinue the brace I will check her back in 1 to 2 months.  I recommend some therapy with her history of CVA and some balance issues she can work on lower extremity strengthening with physical therapy at the [facility] and work on exercises that will improve her balance. ? ?Follow-Up Instructions: No follow-ups on file.  ? ?Orders:  ?Orders Placed This Encounter  ?Procedures  ? XR Lumbar Spine 2-3 Views  ? ?No orders of the defined types were placed in this encounter. ? ? ? ? Procedures: ?No procedures performed ? ? ?Clinical Data: ?No additional findings. ? ? ?Subjective: ?Chief Complaint  ?Patient presents with  ? Lower Back - Follow-up  ?  10/11/2021 L4-5 GILL procedure, Right TLIF  ? ? ?HPI 71 year old female returns post L4-5 fusion with right TLIF.  States she has had 2 episodes when she fell wanted make sure x-rays look good.  She has been wearing her brace if she goes out of the house.  Good relief of preop stenosis symptoms.  Patient had past history of small stroke which resulted in some balance problems she tends to fall toward the right side and states she does not have any weakness and feels like her legs are very strong but balance is her principal problem particular with turning changing directions. ? ?Review of Systems 14 point system unchanged. ? ? ?Objective: ?Vital Signs: BP 110/73   Pulse 78   Ht '5\' 3"'$  (1.6 m)   Wt 192 lb (87.1 kg)   BMI 34.01 kg/m?  ? ?Physical Exam ?Constitutional:   ?   Appearance: She is well-developed.  ?HENT:  ?   Head: Normocephalic.  ?   Right Ear: External ear normal.  ?   Left Ear: External ear  normal. There is no impacted cerumen.  ?Eyes:  ?   Pupils: Pupils are equal, round, and reactive to light.  ?Neck:  ?   Thyroid: No thyromegaly.  ?   Trachea: No tracheal deviation.  ?Cardiovascular:  ?   Rate and Rhythm: Normal rate.  ?Pulmonary:  ?   Effort: Pulmonary effort is normal.  ?Abdominal:  ?   Palpations: Abdomen is soft.  ?Musculoskeletal:  ?   Cervical back: No rigidity.  ?Skin: ?   General: Skin is warm and dry.  ?Neurological:  ?   Mental Status: She is alert and oriented to person, place, and time.  ?Psychiatric:     ?   Behavior: Behavior normal.  ? ? ?Ortho Exam well-healed lumbar incision.  Patient is Dealer. ? ?Specialty Comments:  ?No specialty comments available. ? ?Imaging: ?No results found. ? ? ?PMFS History: ?Patient Active Problem List  ? Diagnosis Date Noted  ? S/P lumbar fusion 10/11/2021  ? Trigger finger, left ring finger 03/10/2018  ? BPPV (benign paroxysmal positional vertigo) 10/22/2016  ? Anxiety state 06/22/2016  ? Tachycardia 04/25/2016  ? Bilateral occipital neuralgia   ? Cryptogenic stroke ([place]) 04/13/2016  ?  Cerebrovascular accident (CVA) due to thrombosis of right middle cerebral artery ([place]) 04/13/2016  ? Cerebral infarction [facility])   ? Thyroid activity decreased   ? Depression   ? Hypothyroidism   ? GERD (gastroesophageal reflux disease)   ? Hyperlipidemia   ? Hypertension   ? IBS (irritable bowel syndrome)   ? ?Past Medical History:  ?Diagnosis Date  ? Arthritis   ? Depression   ? GERD (gastroesophageal reflux disease)   ? Hypercholesterolemia   ? Hypertension   ? Hypothyroidism   ? IBS (irritable bowel syndrome)   ? Stroke [facility])   ? Thyroid disease   ?  ?Family History  ?Problem Relation Age of Onset  ? Dementia Mother   ? Hypercholesterolemia Mother   ? Heart disease Father   ? Stroke Father   ? Heart disease Brother   ? Congestive Heart Failure Sister   ? Breast cancer Paternal Aunt   ?  ?Past Surgical History:  ?Procedure Laterality Date  ? "Tummy Tuck"  2019  ?  ABDOMINAL HYSTERECTOMY    ? APPENDECTOMY    ? CESAREAN SECTION    ? CHOLECYSTECTOMY    ? EP IMPLANTABLE DEVICE N/A 12/28/2016  ? Procedure: Loop Recorder Insertion;  Surgeon: Evans Lance, MD;  Location: [facility];  Service: Cardiovascular;  Laterality: N/A;  ? EYE SURGERY Bilateral   ? cataract removal  ? THYROIDECTOMY    ? TRIGGER FINGER RELEASE    ? ?Social History  ? ?Occupational History  ? Not on file  ?Tobacco Use  ? Smoking status: Never  ? Smokeless tobacco: Former  ?  Types: Chew  ?Vaping Use  ? Vaping Use: Never used  ?Substance and Sexual Activity  ? Alcohol use: No  ? Drug use: No  ? Sexual activity: Not on file  ? ? ? ? ? ? ?

## 2022-04-17 ENCOUNTER — Encounter: Payer: Self-pay | Admitting: Adult Health

## 2022-04-17 ENCOUNTER — Ambulatory Visit (INDEPENDENT_AMBULATORY_CARE_PROVIDER_SITE_OTHER): Payer: Medicare Other | Admitting: Adult Health

## 2022-04-17 DIAGNOSIS — F410 Panic disorder [episodic paroxysmal anxiety] without agoraphobia: Secondary | ICD-10-CM | POA: Diagnosis not present

## 2022-04-17 DIAGNOSIS — F411 Generalized anxiety disorder: Secondary | ICD-10-CM

## 2022-04-17 DIAGNOSIS — F331 Major depressive disorder, recurrent, moderate: Secondary | ICD-10-CM

## 2022-04-17 DIAGNOSIS — G4700 Insomnia, unspecified: Secondary | ICD-10-CM | POA: Diagnosis not present

## 2022-04-17 MED ORDER — LORAZEPAM 0.5 MG PO TABS: 0.5000 mg | ORAL_TABLET | Freq: Every day | ORAL | 2 refills | Status: DC | PRN

## 2022-04-17 NOTE — Progress Notes (Signed)
April Armstrong [id number] 1951/07/03 71 y.o.  Subjective:   Patient ID:  April Armstrong is a 71 y.o. (DOB 1950/12/11) female.  Chief Complaint: No chief complaint on file.   HPI April Armstrong presents to the office today for follow-up of MDD, GAD, insomnia, and panic attacks.   Describes mood today as "ok". Pleasant. Mood symptoms - denies depression and irritability. Increased anxiety - "[place]". Denies panic attacks. Stating "I'm doing alright". Recovering from back surgery - full recovery anticipated in August. Feels like medications continue to work well for her. She and husband doing well. Stable interest and motivation. Taking medications as prescribed.  Energy levels stable. Active, has a regular exercise routine.  Enjoys some usual interests and activities. Married. Lives with husband 3 dogs - 23 chickens and 15 ducks. Has 3 daughters and 8 grandchildren.  Appetite adequate. Weight stable. Sleeps well most nights. Averages 5 to 6 hours. Focus and concentration stable. Completing tasks. Managing aspects of household.  Denies SI or HI.  Denies AH or VH.   Flowsheet Row Pre-Admission Testing 60 from 10/09/2021 in [facility] PREADMISSION TESTING ED from 03/04/2021 in [facility] No Risk No Risk        Review of Systems:  Review of Systems  Musculoskeletal:  Negative for gait problem.  Neurological:  Negative for tremors.  Psychiatric/Behavioral:         Please refer to HPI   Medications: I have reviewed the patient's current medications.  Current Outpatient Medications  Medication Sig Dispense Refill   LORazepam (ATIVAN) 0.5 MG tablet Take 1 tablet (0.5 mg total) by mouth daily as needed for anxiety. 30 tablet 2   atorvastatin (LIPITOR) 80 MG tablet Take 1 tablet (80 mg total) by mouth at bedtime. 30 tablet 0   baclofen (LIORESAL) 10 MG tablet TAKE ONE TABLET BY MOUTH  EVERY 8 HOURS AS NEEDED FOR MUSCLE SPASMS (Patient not taking: Reported on 10/05/2021) 60 tablet 0   buPROPion (WELLBUTRIN XL) 300 MG 24 hr tablet Take 1 tablet (300 mg total) by mouth daily. 90 tablet 3   calcium carbonate (OSCAL) 1500 (600 Ca) MG TABS tablet Take 600 mg of elemental calcium by mouth 2 (two) times daily with a meal.     Cholecalciferol (VITAMIN D) 50 MCG (2000 UT) tablet Take 2,000 Units by mouth daily.     diclofenac sodium (VOLTAREN) 1 % GEL APPLY 2-4 GRAMS TO LARGE JOINT AREA UP TO FOUR TIMES A DAY AS NEEDED 2 Tube 3   gabapentin (NEURONTIN) 100 MG capsule Take one at night for one week then one po bid 60 capsule 1   lamoTRIgine (LAMICTAL) 100 MG tablet Take 1 tablet (100 mg total) by mouth daily. 900 tablet 3   levothyroxine (SYNTHROID) 88 MCG tablet Take 88 mcg by mouth daily before breakfast.     loratadine (CLARITIN) 10 MG tablet Take 10 mg by mouth daily as needed for allergies.     omeprazole (PRILOSEC) 40 MG capsule Take 40 mg by mouth daily.      oxyCODONE-acetaminophen (PERCOCET) 5-325 MG tablet Take 1-2 tablets by mouth every 6 (six) hours as needed for severe pain. (Patient not taking: Reported on 11/21/2021) 40 tablet 0   oxyCODONE-acetaminophen (PERCOCET/ROXICET) 5-325 MG tablet Take 1 tablet by mouth every 8 (eight) hours as needed for severe pain. (Patient not taking: Reported on 02/28/2022) 20 tablet 0   OZEMPIC, 0.25 OR  0.5 MG/DOSE, 2 MG/1.5ML SOPN Inject into the skin.     RESTASIS 0.05 % ophthalmic emulsion Place 1 drop into both eyes 2 (two) times daily.      sertraline (ZOLOFT) 100 MG tablet Take 1 tablet (100 mg total) by mouth daily. 90 tablet 3   spironolactone (ALDACTONE) 25 MG tablet Take 25 mg by mouth daily.      tiZANidine (ZANAFLEX) 4 MG tablet Take 1 tablet (4 mg total) by mouth every 6 (six) hours as needed for muscle spasms. 30 tablet 0   traZODone (DESYREL) 150 MG tablet Take 1 tablet (150 mg total) by mouth at bedtime. 90 tablet 3   No current  facility-administered medications for this visit.    Medication Side Effects: None  Allergies: No Known Allergies  Past Medical History:  Diagnosis Date   Arthritis    Depression    GERD (gastroesophageal reflux disease)    Hypercholesterolemia    Hypertension    Hypothyroidism    IBS (irritable bowel syndrome)    Stroke [facility])    Thyroid disease     Past Medical History, Surgical history, Social history, and Family history were reviewed and updated as appropriate.   Please see review of systems for further details on the patient's review from today.   Objective:   Physical Exam:  There were no vitals taken for this visit.  Physical Exam Constitutional:      General: She is not in acute distress. Musculoskeletal:        General: No deformity.  Neurological:     Mental Status: She is alert and oriented to person, place, and time.     Coordination: Coordination normal.  Psychiatric:        Attention and Perception: Attention and perception normal. She does not perceive auditory or visual hallucinations.        Mood and Affect: Mood normal. Mood is not anxious or depressed. Affect is not labile, blunt, angry or inappropriate.        Speech: Speech normal.        Behavior: Behavior normal.        Thought Content: Thought content normal. Thought content is not paranoid or delusional. Thought content does not include homicidal or suicidal ideation. Thought content does not include homicidal or suicidal plan.        Cognition and Memory: Cognition and memory normal.        Judgment: Judgment normal.     Comments: Insight intact    Lab Review:     Component Value Date/Time   NA 138 10/09/2021 1415   K 3.7 10/09/2021 1415   CL 102 10/09/2021 1415   CO2 27 10/09/2021 1415   GLUCOSE 106 (H) 10/09/2021 1415   BUN 7 (L) 10/09/2021 1415   CREATININE 0.78 10/09/2021 1415   CALCIUM 9.1 10/09/2021 1415   GFRNONAA >60 10/09/2021 1415   GFRAA >60 04/12/2016 1925        Component Value Date/Time   WBC 8.2 10/09/2021 1415   RBC 4.64 10/09/2021 1415   HGB 14.1 10/09/2021 1415   HCT 43.4 10/09/2021 1415   PLT 247 10/09/2021 1415   MCV 93.5 10/09/2021 1415   MCH 30.4 10/09/2021 1415   MCHC 32.5 10/09/2021 1415   RDW 12.9 10/09/2021 1415    No results found for: POCLITH, LITHIUM   No results found for: PHENYTOIN, PHENOBARB, VALPROATE, CBMZ   .res Assessment: Plan:    Plan:  1. Lamictal '100mg'$  daily 2. Wellbutrin  XL '300mg'$  daily 3. Zoloft '100mg'$  daily 4. Trazadone '150mg'$  daily - taking '75mg'$  at hs 5. Add Lorazapam 0.'5mg'$  daily  RTC 3 months  Patient advised to contact office with any questions, adverse effects, or acute worsening in signs and symptoms.  Counseled patient regarding potential benefits, risks, and side effects of Lamictal to include potential risk of Stevens-Johnson syndrome. Advised patient to stop taking Lamictal and contact office immediately if rash develops and to seek urgent medical attention if rash is severe and/or spreading quickly.  Discussed potential benefits, risk, and side effects of benzodiazepines to include potential risk of tolerance and dependence, as well as possible drowsiness. Advised patient not to drive if experiencing drowsiness and to take lowest possible effective dose to minimize risk of dependence and tolerance.  Diagnoses and all orders for this visit:  Panic attacks -     LORazepam (ATIVAN) 0.5 MG tablet; Take 1 tablet (0.5 mg total) by mouth daily as needed for anxiety.  Major depressive disorder, recurrent episode, moderate (HCC)  Generalized anxiety disorder  Insomnia, unspecified type     Please see After Visit Summary for patient specific instructions.  No future appointments.  No orders of the defined types were placed in this encounter.   -------------------------------

## 2022-07-17 ENCOUNTER — Ambulatory Visit (INDEPENDENT_AMBULATORY_CARE_PROVIDER_SITE_OTHER): Payer: Self-pay | Admitting: Adult Health

## 2022-07-17 ENCOUNTER — Ambulatory Visit (INDEPENDENT_AMBULATORY_CARE_PROVIDER_SITE_OTHER): Payer: Medicare Other | Admitting: Adult Health

## 2022-07-17 ENCOUNTER — Encounter: Payer: Self-pay | Admitting: Adult Health

## 2022-07-17 DIAGNOSIS — G4700 Insomnia, unspecified: Secondary | ICD-10-CM | POA: Diagnosis not present

## 2022-07-17 DIAGNOSIS — F331 Major depressive disorder, recurrent, moderate: Secondary | ICD-10-CM

## 2022-07-17 DIAGNOSIS — F410 Panic disorder [episodic paroxysmal anxiety] without agoraphobia: Secondary | ICD-10-CM

## 2022-07-17 DIAGNOSIS — F411 Generalized anxiety disorder: Secondary | ICD-10-CM

## 2022-07-17 DIAGNOSIS — F489 Nonpsychotic mental disorder, unspecified: Secondary | ICD-10-CM

## 2022-07-17 NOTE — Progress Notes (Addendum)
April Armstrong [id number] 06-25-1951 71 y.o.  Subjective:   Patient ID:  April Armstrong is a 71 y.o. (DOB 1951/05/12) female.  Chief Complaint: No chief complaint on file.   HPI April Armstrong presents to the office today for follow-up of MDD, GAD, insomnia and panic attacks.   Describes mood today as "ok". Pleasant. Mood symptoms - denies depression - "I have been, but it's better", anxiety - "at times" and irritability - "not really". Reports some worry and rumination - "more about family". Denies panic attacks. Stating "I'm not doing as well as I was". Reporting some situational stressors within family. Recovered from back surgery. She and husband doing well - recently celebrated 50th wedding anniversary. Stable interest and motivation. Taking medications as prescribed.  Energy levels stable. Active, has a regular exercise routine - walking - swimming.  Enjoys some usual interests and activities. Married. Lives with husband 3 dogs - 23 chickens and 50 ducks. Has 3 daughters and 8 grandchildren.  Appetite adequate. Weight loss.  Sleeps well most nights. Averages 5 to 6 hours. Focus and concentration stable. Completing tasks. Managing aspects of household.  Denies SI or HI.  Denies AH or VH.     Flowsheet Row Pre-Admission Testing 60 from 10/09/2021 in [facility] PREADMISSION TESTING ED from 03/04/2021 in [facility] No Risk No Risk        Review of Systems:  Review of Systems  Musculoskeletal:  Negative for gait problem.  Neurological:  Negative for tremors.  Psychiatric/Behavioral:         Please refer to HPI    Medications: I have reviewed the patient's current medications.  Current Outpatient Medications  Medication Sig Dispense Refill   atorvastatin (LIPITOR) 80 MG tablet Take 1 tablet (80 mg total) by mouth at bedtime. 30 tablet 0   baclofen (LIORESAL) 10 MG tablet TAKE ONE TABLET  BY MOUTH EVERY 8 HOURS AS NEEDED FOR MUSCLE SPASMS (Patient not taking: Reported on 10/05/2021) 60 tablet 0   buPROPion (WELLBUTRIN XL) 300 MG 24 hr tablet Take 1 tablet (300 mg total) by mouth daily. 90 tablet 3   calcium carbonate (OSCAL) 1500 (600 Ca) MG TABS tablet Take 600 mg of elemental calcium by mouth 2 (two) times daily with a meal.     Cholecalciferol (VITAMIN D) 50 MCG (2000 UT) tablet Take 2,000 Units by mouth daily.     diclofenac sodium (VOLTAREN) 1 % GEL APPLY 2-4 GRAMS TO LARGE JOINT AREA UP TO FOUR TIMES A DAY AS NEEDED 2 Tube 3   gabapentin (NEURONTIN) 100 MG capsule Take one at night for one week then one po bid 60 capsule 1   lamoTRIgine (LAMICTAL) 100 MG tablet Take 1 tablet (100 mg total) by mouth daily. 900 tablet 3   levothyroxine (SYNTHROID) 88 MCG tablet Take 88 mcg by mouth daily before breakfast.     loratadine (CLARITIN) 10 MG tablet Take 10 mg by mouth daily as needed for allergies.     LORazepam (ATIVAN) 0.5 MG tablet Take 1 tablet (0.5 mg total) by mouth daily as needed for anxiety. 30 tablet 2   omeprazole (PRILOSEC) 40 MG capsule Take 40 mg by mouth daily.      oxyCODONE-acetaminophen (PERCOCET) 5-325 MG tablet Take 1-2 tablets by mouth every 6 (six) hours as needed for severe pain. (Patient not taking: Reported on 11/21/2021) 40 tablet 0   oxyCODONE-acetaminophen (PERCOCET/ROXICET) 5-325 MG tablet Take 1  tablet by mouth every 8 (eight) hours as needed for severe pain. (Patient not taking: Reported on 02/28/2022) 20 tablet 0   OZEMPIC, 0.25 OR 0.5 MG/DOSE, 2 MG/1.5ML SOPN Inject into the skin.     RESTASIS 0.05 % ophthalmic emulsion Place 1 drop into both eyes 2 (two) times daily.      sertraline (ZOLOFT) 100 MG tablet Take 1 tablet (100 mg total) by mouth daily. 90 tablet 3   spironolactone (ALDACTONE) 25 MG tablet Take 25 mg by mouth daily.      tiZANidine (ZANAFLEX) 4 MG tablet Take 1 tablet (4 mg total) by mouth every 6 (six) hours as needed for muscle spasms. 30  tablet 0   traZODone (DESYREL) 150 MG tablet Take 1 tablet (150 mg total) by mouth at bedtime. 90 tablet 3   No current facility-administered medications for this visit.    Medication Side Effects: None  Allergies: No Known Allergies  Past Medical History:  Diagnosis Date   Arthritis    Depression    GERD (gastroesophageal reflux disease)    Hypercholesterolemia    Hypertension    Hypothyroidism    IBS (irritable bowel syndrome)    Stroke [facility])    Thyroid disease     Past Medical History, Surgical history, Social history, and Family history were reviewed and updated as appropriate.   Please see review of systems for further details on the patient's review from today.   Objective:   Physical Exam:  There were no vitals taken for this visit.  Physical Exam Constitutional:      General: She is not in acute distress. Musculoskeletal:        General: No deformity.  Neurological:     Mental Status: She is alert and oriented to person, place, and time.     Coordination: Coordination normal.  Psychiatric:        Attention and Perception: Attention and perception normal. She does not perceive auditory or visual hallucinations.        Mood and Affect: Mood normal. Mood is not anxious or depressed. Affect is not labile, blunt, angry or inappropriate.        Speech: Speech normal.        Behavior: Behavior normal.        Thought Content: Thought content normal. Thought content is not paranoid or delusional. Thought content does not include homicidal or suicidal ideation. Thought content does not include homicidal or suicidal plan.        Cognition and Memory: Cognition and memory normal.        Judgment: Judgment normal.     Comments: Insight intact     Lab Review:     Component Value Date/Time   NA 138 10/09/2021 1415   K 3.7 10/09/2021 1415   CL 102 10/09/2021 1415   CO2 27 10/09/2021 1415   GLUCOSE 106 (H) 10/09/2021 1415   BUN 7 (L) 10/09/2021 1415   CREATININE  0.78 10/09/2021 1415   CALCIUM 9.1 10/09/2021 1415   GFRNONAA >60 10/09/2021 1415   GFRAA >60 04/12/2016 1925       Component Value Date/Time   WBC 8.2 10/09/2021 1415   RBC 4.64 10/09/2021 1415   HGB 14.1 10/09/2021 1415   HCT 43.4 10/09/2021 1415   PLT 247 10/09/2021 1415   MCV 93.5 10/09/2021 1415   MCH 30.4 10/09/2021 1415   MCHC 32.5 10/09/2021 1415   RDW 12.9 10/09/2021 1415    No results found for: "POCLITH", "  LITHIUM"   No results found for: "PHENYTOIN", "PHENOBARB", "VALPROATE", "CBMZ"   .res Assessment: Plan:    Plan:  1. Lamictal '100mg'$  daily 2. Wellbutrin XL '300mg'$  daily 3. Zoloft '100mg'$  daily 4. Trazadone '150mg'$  daily - taking '75mg'$  at hs 5. Lorazapam 0.'5mg'$  daily - has not taken it.  RTC 3 months  Patient advised to contact office with any questions, adverse effects, or acute worsening in signs and symptoms.  Counseled patient regarding potential benefits, risks, and side effects of Lamictal to include potential risk of Stevens-Johnson syndrome. Advised patient to stop taking Lamictal and contact office immediately if rash develops and to seek urgent medical attention if rash is severe and/or spreading quickly.  Discussed potential benefits, risk, and side effects of benzodiazepines to include potential risk of tolerance and dependence, as well as possible drowsiness. Advised patient not to drive if experiencing drowsiness and to take lowest possible effective dose to minimize risk of dependence and tolerance.  Diagnoses and all orders for this visit:  Panic attacks  Major depressive disorder, recurrent episode, moderate (HCC)  Insomnia, unspecified type  Generalized anxiety disorder     Please see After Visit Summary for patient specific instructions.  Future Appointments  Date Time Provider [place]  10/23/2022  2:00 PM Marvie Calender, Berdie Ogren, NP [facility] None     No orders of the defined types were placed in this  encounter.   -------------------------------

## 2022-07-17 NOTE — Progress Notes (Signed)
Patient no show appointment. ? ?

## 2022-10-23 ENCOUNTER — Ambulatory Visit: Payer: Medicare Other | Admitting: Adult Health

## 2022-11-01 ENCOUNTER — Other Ambulatory Visit: Payer: Self-pay | Admitting: Physician Assistant

## 2022-11-01 DIAGNOSIS — Z1231 Encounter for screening mammogram for malignant neoplasm of breast: Secondary | ICD-10-CM

## 2022-11-22 IMAGING — MR MR LUMBAR SPINE W/O CM
4 of 5 series · 25 of 48 positions shown · non-contrast
Comparison: 5372

CLINICAL DATA: Lumbar radiculopathy, right-sided sciatica

EXAM:
MRI LUMBAR SPINE WITHOUT CONTRAST
TECHNIQUE: Multiplanar, multisequence MR imaging of the lumbar spine was
performed. No intravenous contrast was administered.

[Series 3: T2 · sagittal · 4.0mm · 0.53mm/px · 6 of 16 slices shown (1 of 2)]
[im 1/16]
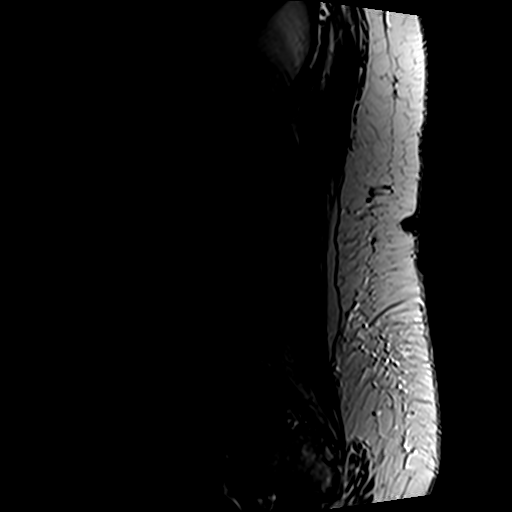
[im 4/16]
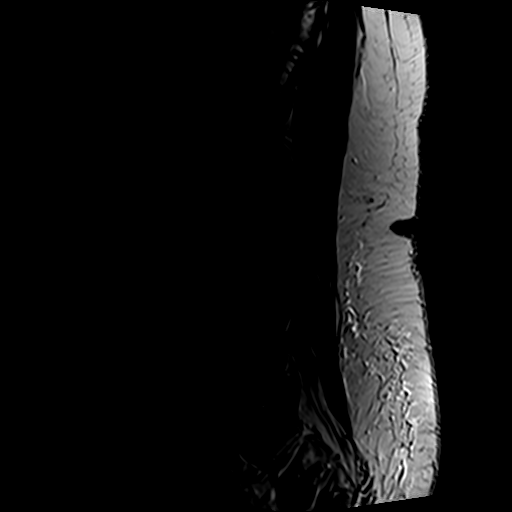
[im 7/16]
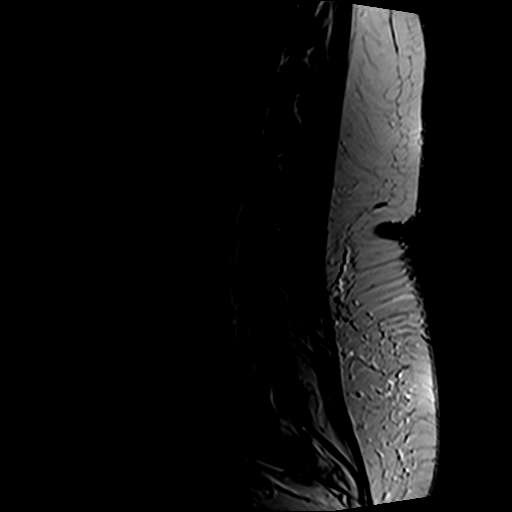
[im 10/16]
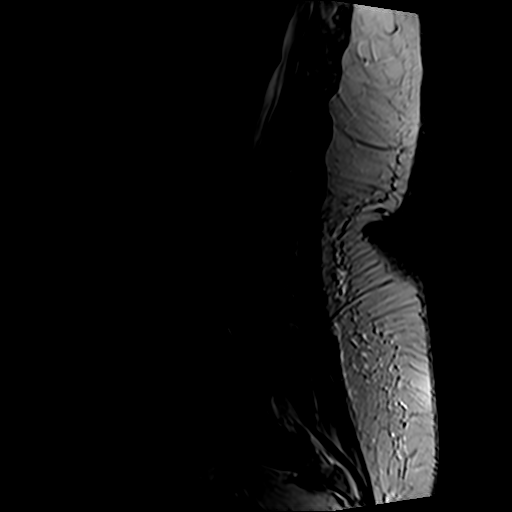
[im 13/16]
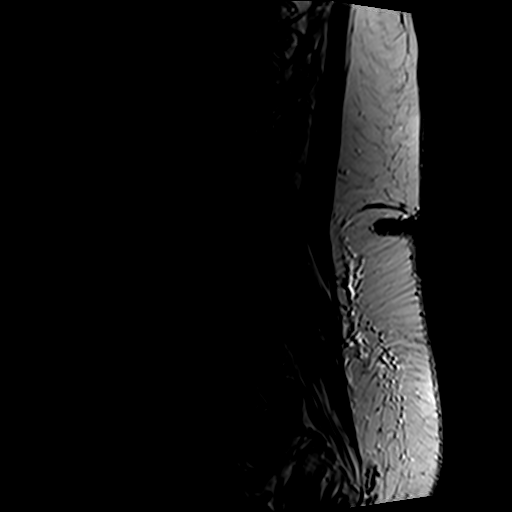
[im 16/16]
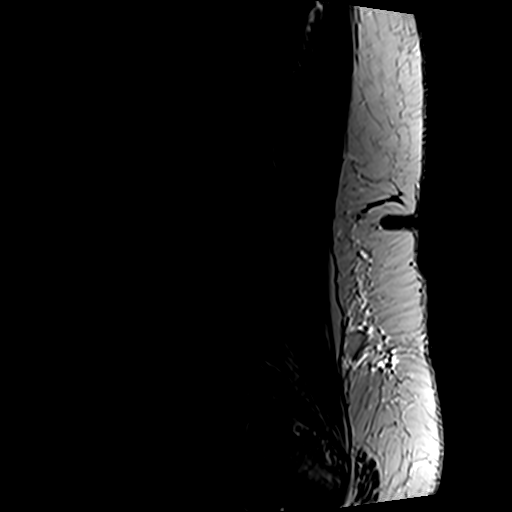

[Series 5: T1 · sagittal · 4.0mm · 0.53mm/px · 7 of 16 slices shown (1 of 2)]
[im 1/16]
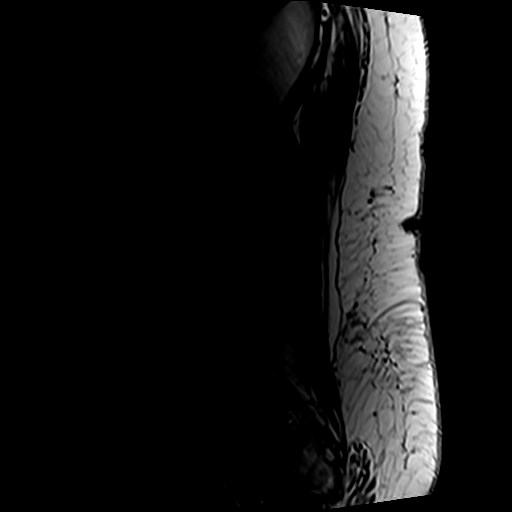
[im 3/16]
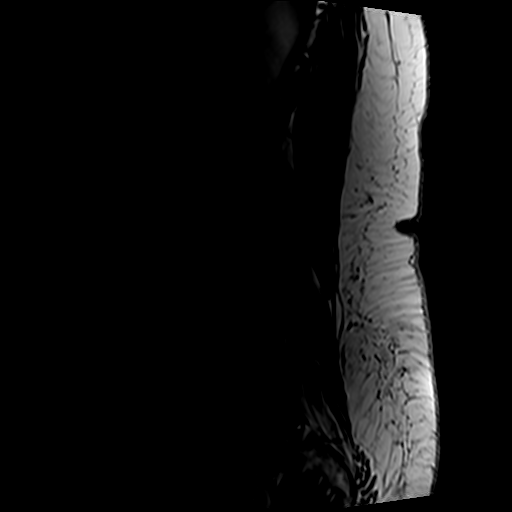
[im 6/16]
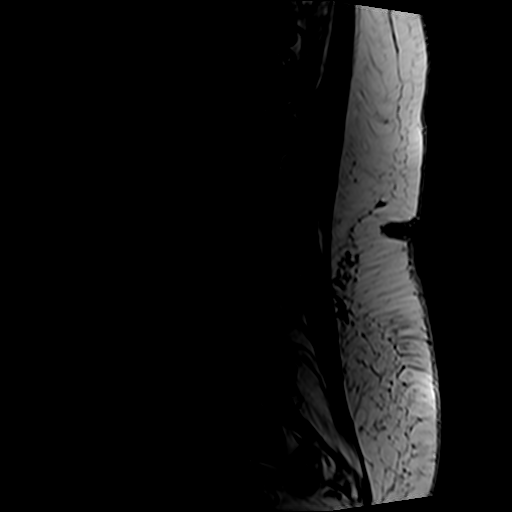
[im 8/16]
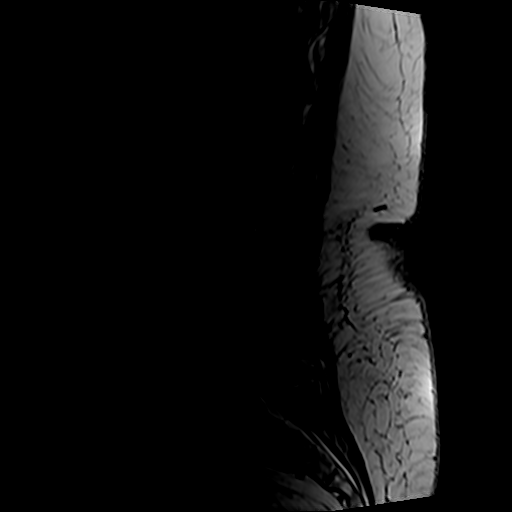
[im 11/16]
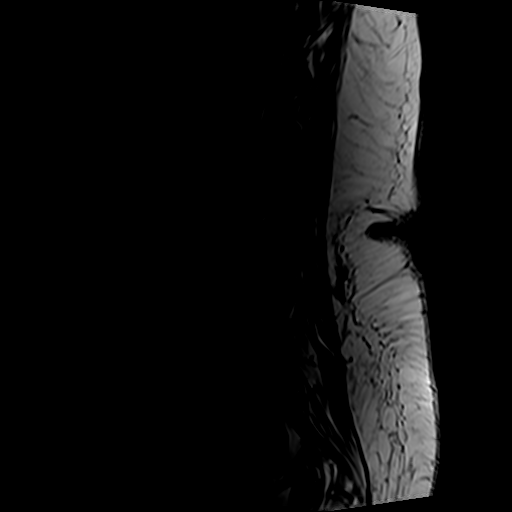
[im 13/16]
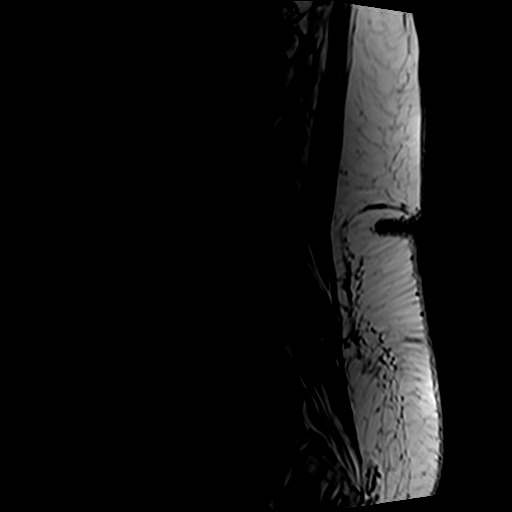
[im 16/16]
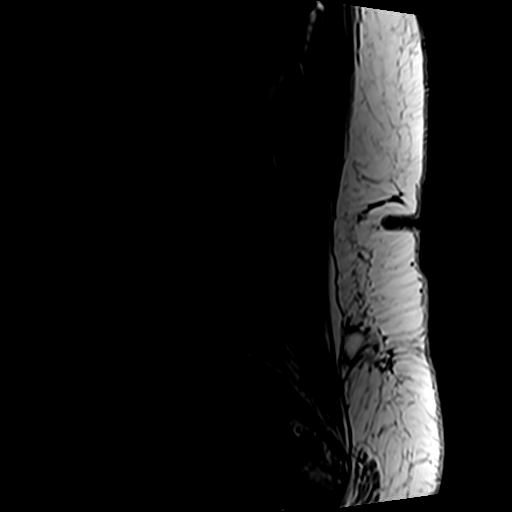

[Series 6: T2 · axial · 4.0mm · 0.70mm/px · z∈[-93,+124]mm · 8 of 35 slices shown (2 of 2)]
[im 1/35]
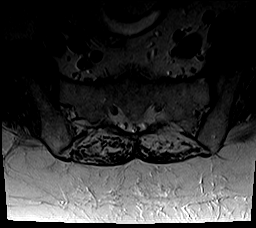
[im 6/35]
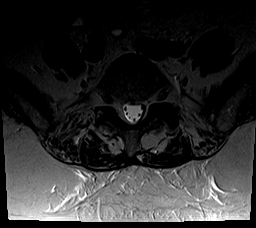
[im 11/35]
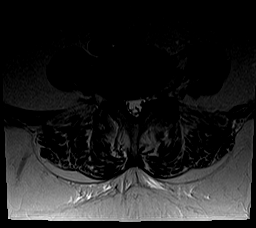
[im 16/35]
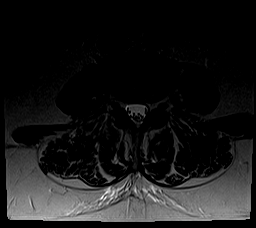
[im 19/35]
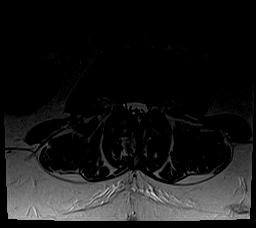
[im 24/35]
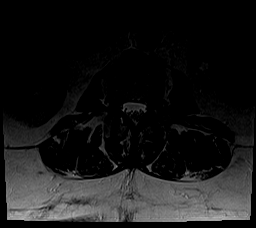
[im 29/35]
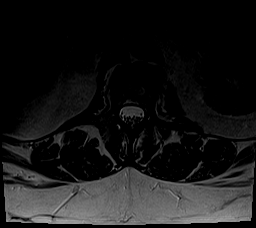
[im 35/35]
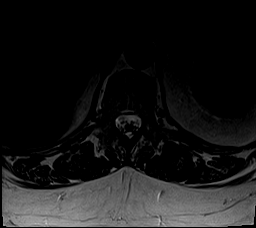

[Series 7: T1 · axial · 4.0mm · 0.35mm/px · z∈[-93,+93]mm · 4 of 35 slices shown (2 of 2)]
[im 1/35]
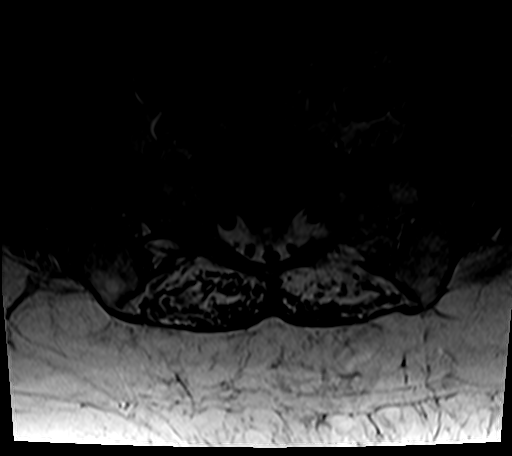
[im 6/35]
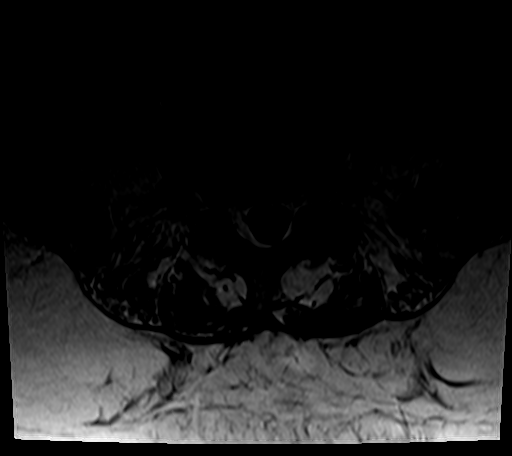
[im 19/35]
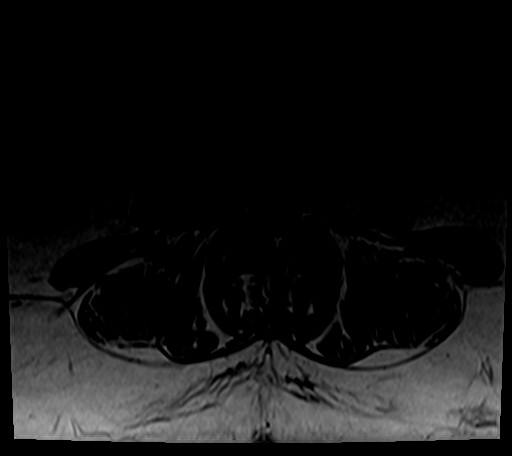
[im 29/35]
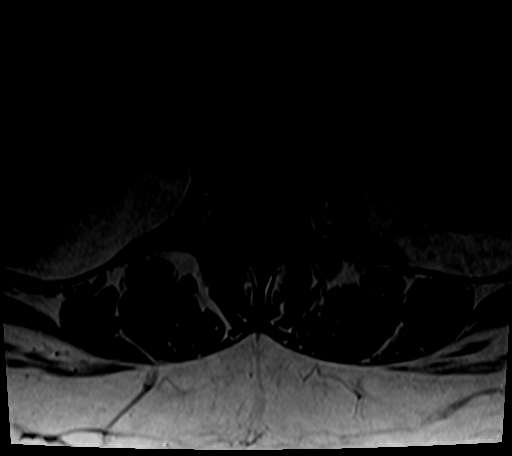

[25 of 48 positions shown; findings below may reference images not displayed]

FINDINGS: Segmentation:  Standard.

Alignment:  Increased mild grade 1 anterolisthesis at L4-L5.

Vertebrae: Stable vertebral body heights. No substantial marrow
edema. No suspicious osseous lesion.

Conus medullaris and cauda equina: Conus extends to the T12-L1
level. Conus and cauda equina appear normal.

Paraspinal and other soft tissues: Unremarkable.

Disc levels:

L1-L2:  No canal or foraminal stenosis.

L2-L3: Disc bulge. Mild facet arthropathy. No canal or foraminal
stenosis.

L3-L4: Disc bulge. Mild facet arthropathy. No canal or foraminal
stenosis.

L4-L5: Anterolisthesis with uncovering of disc bulge with
superimposed right foraminal disc protrusion. Moderate facet
arthropathy with ligamentum flavum infolding. Increased mild to
moderate canal stenosis. Increased narrowing of the right greater
than left subarticular recesses. Increased moderate right foraminal
stenosis. Similar mild left foraminal stenosis.

L5-S1:  No canal or foraminal stenosis.
IMPRESSION: Multilevel degenerative changes as detailed above with progression
at L4-L5 since 5372. There is right subarticular recess and
foraminal stenosis at this level with potential nerve root
compression.

## 2022-12-27 ENCOUNTER — Ambulatory Visit: Payer: Medicare Other

## 2023-01-09 ENCOUNTER — Ambulatory Visit (INDEPENDENT_AMBULATORY_CARE_PROVIDER_SITE_OTHER): Payer: Medicare Other | Admitting: Podiatry

## 2023-01-09 ENCOUNTER — Encounter: Payer: Self-pay | Admitting: Podiatry

## 2023-01-09 DIAGNOSIS — L600 Ingrowing nail: Secondary | ICD-10-CM

## 2023-01-09 NOTE — Patient Instructions (Signed)

## 2023-01-10 NOTE — Progress Notes (Signed)
Subjective:   Patient ID: April Armstrong, female   DOB: 72 y.o.   MRN: [id number]   HPI Patient presents with painful ingrown toenail of the left big toe that she states that she tries to trim out and soak and has not been able to and it gets sore.  Patient has had it for several months does not smoke likes to be active   Review of Systems  All other systems reviewed and are negative.       Objective:  Physical Exam Vitals and nursing note reviewed.  Constitutional:      Appearance: She is well-developed.  Pulmonary:     Effort: Pulmonary effort is normal.  Musculoskeletal:        General: Normal range of motion.  Skin:    General: Skin is warm.  Neurological:     Mental Status: She is alert.     Neurovascular status intact muscle strength found to be adequate range of motion adequate with patient found to have incurvated left hallux medial border painful when pressed with difficulty wearing shoe gear at the current time that is tight.  No erythema edema or drainage associated with this deformity     Assessment:  Ingrown toenail deformity left hallux medial border that is painful when pressed making shoe gear difficult     Plan:  H&P reviewed recommended correction of deformity explained the procedure and risk and patient wants surgery.  Patient read then signed consent form understanding risk and I infiltrated the left hallux 60 mg like Marcaine mixture sterile prep done and using sterile instrumentation remove the lateral border exposed matrix applied phenol 3 applications 30 seconds followed by alcohol lavage sterile dressing gave instructions on soaks and to wear dressing 24 hours but take it off earlier if throbbing were to occur.  Encouraged to call questions concerns which may arise

## 2023-01-17 ENCOUNTER — Other Ambulatory Visit: Payer: Self-pay | Admitting: Adult Health

## 2023-01-17 DIAGNOSIS — F411 Generalized anxiety disorder: Secondary | ICD-10-CM

## 2023-01-17 DIAGNOSIS — F331 Major depressive disorder, recurrent, moderate: Secondary | ICD-10-CM

## 2023-01-20 NOTE — Telephone Encounter (Signed)
Please call to schedule an appt, was due in November

## 2023-01-21 NOTE — Telephone Encounter (Signed)
Pt made appt for 3/15

## 2023-01-25 ENCOUNTER — Telehealth: Payer: Self-pay | Admitting: Adult Health

## 2023-01-25 NOTE — Telephone Encounter (Signed)
Daughter, Nira Conn, called to report that her moms anxiety is not fine. She will need an adjustment.  She was to have ann appt. 3/15 but we had to cancel the appt due to Barnett Applebaum being out of the office.  Nira Conn will have her mom call to RS.  She needs to be seen.  There is not an ROI on file to speak with Heather.  She will have her mom do that too.  Nira Conn is in the processing of having her mom referred to [facility] which is causing added anxiety. Nira Conn just wants Barnett Applebaum be aware of all of this.  It can be addressed at next appt once made. Nira Conn will come to the appt. With Pam.

## 2023-01-25 NOTE — Telephone Encounter (Signed)
FYI. Daughter said it could be addressed at her next appt, though doesn't have one scheduled yet.

## 2023-01-25 NOTE — Telephone Encounter (Signed)
Noted  

## 2023-02-08 ENCOUNTER — Ambulatory Visit: Payer: Medicare Other | Admitting: Adult Health

## 2023-02-13 ENCOUNTER — Ambulatory Visit: Payer: Medicare Other | Admitting: Adult Health

## 2023-02-22 ENCOUNTER — Other Ambulatory Visit: Payer: Self-pay | Admitting: Adult Health

## 2023-02-22 DIAGNOSIS — F331 Major depressive disorder, recurrent, moderate: Secondary | ICD-10-CM

## 2023-02-22 DIAGNOSIS — F411 Generalized anxiety disorder: Secondary | ICD-10-CM

## 2023-02-24 NOTE — Telephone Encounter (Signed)
Has appt 4/3

## 2023-02-27 ENCOUNTER — Encounter: Payer: Self-pay | Admitting: Adult Health

## 2023-02-27 ENCOUNTER — Ambulatory Visit (INDEPENDENT_AMBULATORY_CARE_PROVIDER_SITE_OTHER): Payer: Medicare Other | Admitting: Adult Health

## 2023-02-27 DIAGNOSIS — F411 Generalized anxiety disorder: Secondary | ICD-10-CM

## 2023-02-27 DIAGNOSIS — F331 Major depressive disorder, recurrent, moderate: Secondary | ICD-10-CM

## 2023-02-27 DIAGNOSIS — G4700 Insomnia, unspecified: Secondary | ICD-10-CM

## 2023-02-27 DIAGNOSIS — F410 Panic disorder [episodic paroxysmal anxiety] without agoraphobia: Secondary | ICD-10-CM | POA: Diagnosis not present

## 2023-02-27 MED ORDER — SERTRALINE HCL 100 MG PO TABS: ORAL_TABLET | ORAL | 3 refills | Status: DC

## 2023-02-27 MED ORDER — LAMOTRIGINE 150 MG PO TABS: 150.0000 mg | ORAL_TABLET | Freq: Every day | ORAL | 3 refills | Status: DC

## 2023-02-27 MED ORDER — BUPROPION HCL ER (XL) 300 MG PO TB24: 300.0000 mg | ORAL_TABLET | Freq: Every day | ORAL | 3 refills | Status: DC

## 2023-02-27 MED ORDER — TRAZODONE HCL 150 MG PO TABS: 150.0000 mg | ORAL_TABLET | Freq: Every day | ORAL | 3 refills | Status: DC

## 2023-02-27 NOTE — Progress Notes (Signed)
April Armstrong [id number] 01-22-1951 72 y.o.  Subjective:   Patient ID:  April Armstrong is a 72 y.o. (DOB [birth year]) female.  Chief Complaint: No chief complaint on file.   HPI April Armstrong presents to the office today for follow-up of  MDD, GAD, insomnia and panic attacks.   Describes mood today as "ok". Pleasant. Mood symptoms - denies depression - "worries about finances". Reports anxiety - "it's spotty - comes from no where". Daughter feels like she is anxious a lot of the time - unaware of triggers. Gets frustrated with husband's loss of hearing. Denies irritability. Reports worry, rumination and over thinking. Denies recent panic attacks. Mood is consistent. Stating "things could be better". Reporting some situational stressors - family. She and husband doing well - recently celebrated 50th wedding anniversary. Stable interest and motivation. Taking medications as prescribed.   I'm a fixer. How can I change things.   Feeling more tired - dragging.  Worried about balance issues - stroke 7 years ago.  Energy levels stable. Active, does not have a regular exercise routine.  Enjoys some usual interests and activities. Married. Lives with husband 3 dogs - 23 chickens and 80 ducks. Has 3 daughters and 9 grandchildren.  Appetite adequate. Weight loss. Has taken Ozempic. Sleeps well most nights. Averages 5 to 6 hours. Staying up late at night - "night owl". Focus and concentration stable. Completing tasks. Managing aspects of household.  Denies SI or HI.  Denies AH or VH. Denies self harm.  Denies substance use.   Flowsheet Row Pre-Admission Testing 60 from 10/09/2021 in [facility] PREADMISSION TESTING ED from 03/04/2021 in [facility] Emergency Department at [place] No Risk No Risk        Review of Systems:  Review of Systems  Musculoskeletal:  Negative for gait problem.  Neurological:  Negative for tremors.   Psychiatric/Behavioral:         Please refer to HPI    Medications: I have reviewed the patient's current medications.  Current Outpatient Medications  Medication Sig Dispense Refill   atorvastatin (LIPITOR) 80 MG tablet Take 1 tablet (80 mg total) by mouth at bedtime. 30 tablet 0   baclofen (LIORESAL) 10 MG tablet TAKE ONE TABLET BY MOUTH EVERY 8 HOURS AS NEEDED FOR MUSCLE SPASMS (Patient not taking: Reported on 10/05/2021) 60 tablet 0   buPROPion (WELLBUTRIN XL) 300 MG 24 hr tablet Take 1 tablet (300 mg total) by mouth daily. 90 tablet 3   calcium carbonate (OSCAL) 1500 (600 Ca) MG TABS tablet Take 600 mg of elemental calcium by mouth 2 (two) times daily with a meal.     Cholecalciferol (VITAMIN D) 50 MCG (2000 UT) tablet Take 2,000 Units by mouth daily.     diclofenac sodium (VOLTAREN) 1 % GEL APPLY 2-4 GRAMS TO LARGE JOINT AREA UP TO FOUR TIMES A DAY AS NEEDED 2 Tube 3   gabapentin (NEURONTIN) 100 MG capsule Take one at night for one week then one po bid 60 capsule 1   lamoTRIgine (LAMICTAL) 150 MG tablet Take 1 tablet (150 mg total) by mouth daily. 90 tablet 3   levothyroxine (SYNTHROID) 88 MCG tablet Take 88 mcg by mouth daily before breakfast.     loratadine (CLARITIN) 10 MG tablet Take 10 mg by mouth daily as needed for allergies.     LORazepam (ATIVAN) 0.5 MG tablet Take 1 tablet (0.5 mg total) by mouth daily as needed for anxiety.  30 tablet 2   omeprazole (PRILOSEC) 40 MG capsule Take 40 mg by mouth daily.      oxyCODONE-acetaminophen (PERCOCET/ROXICET) 5-325 MG tablet Take 1 tablet by mouth every 8 (eight) hours as needed for severe pain. 20 tablet 0   OZEMPIC, 0.25 OR 0.5 MG/DOSE, 2 MG/1.5ML SOPN Inject into the skin.     RESTASIS 0.05 % ophthalmic emulsion Place 1 drop into both eyes 2 (two) times daily.      sertraline (ZOLOFT) 100 MG tablet Take one and 1/2 tablets daily. 135 tablet 3   spironolactone (ALDACTONE) 25 MG tablet Take 25 mg by mouth daily.      tiZANidine  (ZANAFLEX) 4 MG tablet Take 1 tablet (4 mg total) by mouth every 6 (six) hours as needed for muscle spasms. 30 tablet 0   traZODone (DESYREL) 150 MG tablet Take 1 tablet (150 mg total) by mouth at bedtime. 90 tablet 3   No current facility-administered medications for this visit.    Medication Side Effects: None  Allergies: No Known Allergies  Past Medical History:  Diagnosis Date   Arthritis    Depression    GERD (gastroesophageal reflux disease)    Hypercholesterolemia    Hypertension    Hypothyroidism    IBS (irritable bowel syndrome)    Stroke [facility])    Thyroid disease     Past Medical History, Surgical history, Social history, and Family history were reviewed and updated as appropriate.   Please see review of systems for further details on the patient's review from today.   Objective:   Physical Exam:  There were no vitals taken for this visit.  Physical Exam Constitutional:      General: She is not in acute distress. Musculoskeletal:        General: No deformity.  Neurological:     Mental Status: She is alert and oriented to person, place, and time.     Coordination: Coordination normal.  Psychiatric:        Attention and Perception: Attention and perception normal. She does not perceive auditory or visual hallucinations.        Mood and Affect: Mood normal. Mood is not anxious or depressed. Affect is not labile, blunt, angry or inappropriate.        Speech: Speech normal.        Behavior: Behavior normal.        Thought Content: Thought content normal. Thought content is not paranoid or delusional. Thought content does not include homicidal or suicidal ideation. Thought content does not include homicidal or suicidal plan.        Cognition and Memory: Cognition and memory normal.        Judgment: Judgment normal.     Comments: Insight intact     Lab Review:     Component Value Date/Time   NA 138 10/09/2021 1415   K 3.7 10/09/2021 1415   CL 102 10/09/2021  1415   CO2 27 10/09/2021 1415   GLUCOSE 106 (H) 10/09/2021 1415   BUN 7 (L) 10/09/2021 1415   CREATININE 0.78 10/09/2021 1415   CALCIUM 9.1 10/09/2021 1415   GFRNONAA >60 10/09/2021 1415   GFRAA >60 04/12/2016 1925       Component Value Date/Time   WBC 8.2 10/09/2021 1415   RBC 4.64 10/09/2021 1415   HGB 14.1 10/09/2021 1415   HCT 43.4 10/09/2021 1415   PLT 247 10/09/2021 1415   MCV 93.5 10/09/2021 1415   MCH 30.4 10/09/2021 1415  MCHC 32.5 10/09/2021 1415   RDW 12.9 10/09/2021 1415    No results found for: "POCLITH", "LITHIUM"   No results found for: "PHENYTOIN", "PHENOBARB", "VALPROATE", "CBMZ"   .res Assessment: Plan:    Plan:  Increase Zoloft 100mg  to 150mg  daily Increase Lamictal 100mg  to 150mg  daily  Wellbutrin XL 300mg  daily Trazadone 150mg  daily  Lorazapam 0.5mg  daily - rarely takes.  RTC 3 months  05/29 - referral to [facility]   Patient advised to contact office with any questions, adverse effects, or acute worsening in signs and symptoms.  Counseled patient regarding potential benefits, risks, and side effects of Lamictal to include potential risk of Stevens-Johnson syndrome. Advised patient to stop taking Lamictal and contact office immediately if rash develops and to seek urgent medical attention if rash is severe and/or spreading quickly.  Discussed potential benefits, risk, and side effects of benzodiazepines to include potential risk of tolerance and dependence, as well as possible drowsiness. Advised patient not to drive if experiencing drowsiness and to take lowest possible effective dose to minimize risk of dependence and tolerance.  Diagnoses and all orders for this visit:  Major depressive disorder, recurrent episode, moderate -     sertraline (ZOLOFT) 100 MG tablet; Take one and 1/2 tablets daily. -     traZODone (DESYREL) 150 MG tablet; Take 1 tablet (150 mg total) by mouth at bedtime. -     lamoTRIgine (LAMICTAL) 150 MG tablet; Take 1 tablet  (150 mg total) by mouth daily. -     buPROPion (WELLBUTRIN XL) 300 MG 24 hr tablet; Take 1 tablet (300 mg total) by mouth daily.  Generalized anxiety disorder -     sertraline (ZOLOFT) 100 MG tablet; Take one and 1/2 tablets daily. -     traZODone (DESYREL) 150 MG tablet; Take 1 tablet (150 mg total) by mouth at bedtime. -     lamoTRIgine (LAMICTAL) 150 MG tablet; Take 1 tablet (150 mg total) by mouth daily. -     buPROPion (WELLBUTRIN XL) 300 MG 24 hr tablet; Take 1 tablet (300 mg total) by mouth daily.  Insomnia, unspecified type  Panic attacks     Please see After Visit Summary for patient specific instructions.  No future appointments.  No orders of the defined types were placed in this encounter.   -------------------------------

## 2023-03-29 ENCOUNTER — Ambulatory Visit: Payer: Medicare Other | Admitting: Adult Health

## 2023-05-27 ENCOUNTER — Other Ambulatory Visit: Payer: Self-pay | Admitting: Adult Health

## 2023-05-27 DIAGNOSIS — F411 Generalized anxiety disorder: Secondary | ICD-10-CM

## 2023-05-27 DIAGNOSIS — F331 Major depressive disorder, recurrent, moderate: Secondary | ICD-10-CM

## 2023-11-29 ENCOUNTER — Other Ambulatory Visit: Payer: Self-pay | Admitting: Physician Assistant

## 2023-11-29 DIAGNOSIS — Z1231 Encounter for screening mammogram for malignant neoplasm of breast: Secondary | ICD-10-CM

## 2023-12-12 ENCOUNTER — Ambulatory Visit
Admission: RE | Admit: 2023-12-12 | Discharge: 2023-12-12 | Disposition: A | Discharge status: Home / Self Care | Payer: Medicare Other | Source: Ambulatory Visit | Attending: Physician Assistant | Admitting: Physician Assistant

## 2023-12-12 DIAGNOSIS — Z1231 Encounter for screening mammogram for malignant neoplasm of breast: Secondary | ICD-10-CM

## 2023-12-18 ENCOUNTER — Encounter: Payer: Self-pay | Admitting: Adult Health

## 2023-12-18 ENCOUNTER — Ambulatory Visit (INDEPENDENT_AMBULATORY_CARE_PROVIDER_SITE_OTHER): Payer: Medicare Other | Admitting: Adult Health

## 2023-12-18 DIAGNOSIS — F410 Panic disorder [episodic paroxysmal anxiety] without agoraphobia: Secondary | ICD-10-CM | POA: Diagnosis not present

## 2023-12-18 DIAGNOSIS — F331 Major depressive disorder, recurrent, moderate: Secondary | ICD-10-CM

## 2023-12-18 DIAGNOSIS — G4700 Insomnia, unspecified: Secondary | ICD-10-CM | POA: Diagnosis not present

## 2023-12-18 DIAGNOSIS — F411 Generalized anxiety disorder: Secondary | ICD-10-CM

## 2023-12-18 MED ORDER — ALPRAZOLAM 0.25 MG PO TABS: 0.2500 mg | ORAL_TABLET | Freq: Every day | ORAL | 2 refills | Status: AC | PRN

## 2023-12-18 NOTE — Progress Notes (Signed)
Arletha Trevithick [id number] November 12, 1951 73 y.o.  Subjective:   Patient ID:  Saranne Pinta is a 73 y.o. (DOB Aug 07, 1951) female.  Chief Complaint: No chief complaint on file.   HPI Brishae Lancaster presents to the office today for follow-up of MDD, GAD, insomnia and panic attacks.   Describes mood today as "ok". Pleasant. Mood symptoms - denies depression - "more situational". Decreased interest and motivation. Reports recent loss of brother and sister.  Reports anxiety - "insides moving and can't get them to stop". Denies panic attacks. Denies irritability. Reports worry, rumination and over thinking. Mood is lower. Stating "I feel anxious - fluttering inside".  Taking medications as prescribed.  Energy levels lower. Active, does not have a regular exercise routine.  Enjoys some usual interests and activities. Married. Lives with husband 3 dogs - 23 chickens and 11 ducks. Has 3 daughters and 9 grandchildren.  Appetite adequate. Weight loss with Ozempic. Sleeps well most nights. Averages 6 to 8 hours.  Focus and concentration stable. Completing tasks. Managing aspects of household.  Denies SI or HI.  Denies AH or VH. Denies self harm.  Denies substance use.    Flowsheet Row Pre-Admission Testing 60 from 10/09/2021 in [facility] PREADMISSION TESTING ED from 03/04/2021 in [facility] Emergency Department at [facility]  C-SSRS RISK CATEGORY No Risk No Risk        Review of Systems:  Review of Systems  Musculoskeletal:  Negative for gait problem.  Neurological:  Negative for tremors.  Psychiatric/Behavioral:         Please refer to HPI    Medications: I have reviewed the patient's current medications.  Current Outpatient Medications  Medication Sig Dispense Refill   atorvastatin (LIPITOR) 80 MG tablet Take 1 tablet (80 mg total) by mouth at bedtime. 30 tablet 0   baclofen (LIORESAL) 10 MG tablet TAKE ONE TABLET BY MOUTH EVERY 8 HOURS AS NEEDED  FOR MUSCLE SPASMS (Patient not taking: Reported on 10/05/2021) 60 tablet 0   buPROPion (WELLBUTRIN XL) 300 MG 24 hr tablet Take 1 tablet (300 mg total) by mouth daily. 90 tablet 3   calcium carbonate (OSCAL) 1500 (600 Ca) MG TABS tablet Take 600 mg of elemental calcium by mouth 2 (two) times daily with a meal.     Cholecalciferol (VITAMIN D) 50 MCG (2000 UT) tablet Take 2,000 Units by mouth daily.     diclofenac sodium (VOLTAREN) 1 % GEL APPLY 2-4 GRAMS TO LARGE JOINT AREA UP TO FOUR TIMES A DAY AS NEEDED 2 Tube 3   gabapentin (NEURONTIN) 100 MG capsule Take one at night for one week then one po bid 60 capsule 1   lamoTRIgine (LAMICTAL) 150 MG tablet Take 1 tablet (150 mg total) by mouth daily. 90 tablet 3   levothyroxine (SYNTHROID) 88 MCG tablet Take 88 mcg by mouth daily before breakfast.     loratadine (CLARITIN) 10 MG tablet Take 10 mg by mouth daily as needed for allergies.     LORazepam (ATIVAN) 0.5 MG tablet Take 1 tablet (0.5 mg total) by mouth daily as needed for anxiety. 30 tablet 2   omeprazole (PRILOSEC) 40 MG capsule Take 40 mg by mouth daily.      oxyCODONE-acetaminophen (PERCOCET/ROXICET) 5-325 MG tablet Take 1 tablet by mouth every 8 (eight) hours as needed for severe pain. 20 tablet 0   OZEMPIC, 0.25 OR 0.5 MG/DOSE, 2 MG/1.5ML SOPN Inject into the skin.     RESTASIS 0.05 %  ophthalmic emulsion Place 1 drop into both eyes 2 (two) times daily.      sertraline (ZOLOFT) 100 MG tablet Take one and 1/2 tablets daily. 135 tablet 3   spironolactone (ALDACTONE) 25 MG tablet Take 25 mg by mouth daily.      tiZANidine (ZANAFLEX) 4 MG tablet Take 1 tablet (4 mg total) by mouth every 6 (six) hours as needed for muscle spasms. 30 tablet 0   traZODone (DESYREL) 150 MG tablet Take 1 tablet (150 mg total) by mouth at bedtime. 90 tablet 3   No current facility-administered medications for this visit.    Medication Side Effects: None  Allergies: No Known Allergies  Past Medical History:   Diagnosis Date   Arthritis    Depression    GERD (gastroesophageal reflux disease)    Hypercholesterolemia    Hypertension    Hypothyroidism    IBS (irritable bowel syndrome)    Stroke [facility])    Thyroid disease     Past Medical History, Surgical history, Social history, and Family history were reviewed and updated as appropriate.   Please see review of systems for further details on the patient's review from today.   Objective:   Physical Exam:  There were no vitals taken for this visit.  Physical Exam Constitutional:      General: She is not in acute distress. Musculoskeletal:        General: No deformity.  Neurological:     Mental Status: She is alert and oriented to person, place, and time.     Coordination: Coordination normal.  Psychiatric:        Attention and Perception: Attention and perception normal. She does not perceive auditory or visual hallucinations.        Mood and Affect: Mood normal. Mood is not anxious or depressed. Affect is not labile, blunt, angry or inappropriate.        Speech: Speech normal.        Behavior: Behavior normal.        Thought Content: Thought content normal. Thought content is not paranoid or delusional. Thought content does not include homicidal or suicidal ideation. Thought content does not include homicidal or suicidal plan.        Cognition and Memory: Cognition and memory normal.        Judgment: Judgment normal.     Comments: Insight intact     Lab Review:     Component Value Date/Time   NA 138 10/09/2021 1415   K 3.7 10/09/2021 1415   CL 102 10/09/2021 1415   CO2 27 10/09/2021 1415   GLUCOSE 106 (H) 10/09/2021 1415   BUN 7 (L) 10/09/2021 1415   CREATININE 0.78 10/09/2021 1415   CALCIUM 9.1 10/09/2021 1415   GFRNONAA >60 10/09/2021 1415   GFRAA >60 04/12/2016 1925       Component Value Date/Time   WBC 8.2 10/09/2021 1415   RBC 4.64 10/09/2021 1415   HGB 14.1 10/09/2021 1415   HCT 43.4 10/09/2021 1415   PLT  247 10/09/2021 1415   MCV 93.5 10/09/2021 1415   MCH 30.4 10/09/2021 1415   MCHC 32.5 10/09/2021 1415   RDW 12.9 10/09/2021 1415    No results found for: "POCLITH", "LITHIUM"   No results found for: "PHENYTOIN", "PHENOBARB", "VALPROATE", "CBMZ"   .res Assessment: Plan:    Plan:  Zoloft 150mg  daily Lamictal 150mg  daily Wellbutrin XL 300mg  daily Trazadone 150mg  daily  D/C Lorazapam 0.5mg  daily - rarely takes. Add Xanax 0.25mg   daily - 30 tabs once for grief/loss  RTC 3 months  Patient advised to contact office with any questions, adverse effects, or acute worsening in signs and symptoms.  Counseled patient regarding potential benefits, risks, and side effects of Lamictal to include potential risk of Stevens-Johnson syndrome. Advised patient to stop taking Lamictal and contact office immediately if rash develops and to seek urgent medical attention if rash is severe and/or spreading quickly.  Discussed potential benefits, risk, and side effects of benzodiazepines to include potential risk of tolerance and dependence, as well as possible drowsiness. Advised patient not to drive if experiencing drowsiness and to take lowest possible effective dose to minimize risk of dependence and tolerance. There are no diagnoses linked to this encounter.   Please see After Visit Summary for patient specific instructions.  No future appointments.  No orders of the defined types were placed in this encounter.   -------------------------------

## 2024-02-25 ENCOUNTER — Encounter: Payer: Self-pay | Admitting: Cardiology

## 2024-02-25 ENCOUNTER — Ambulatory Visit: Payer: Medicare Other | Attending: Cardiology | Admitting: Cardiology

## 2024-02-25 VITALS — BP 114/84 | HR 79 | Resp 16 | Ht 63.0 in | Wt 190.6 lb

## 2024-02-25 DIAGNOSIS — I10 Essential (primary) hypertension: Secondary | ICD-10-CM | POA: Diagnosis not present

## 2024-02-25 DIAGNOSIS — Z8673 Personal history of transient ischemic attack (TIA), and cerebral infarction without residual deficits: Secondary | ICD-10-CM | POA: Diagnosis not present

## 2024-02-25 DIAGNOSIS — E782 Mixed hyperlipidemia: Secondary | ICD-10-CM

## 2024-02-25 MED ORDER — ASPIRIN 81 MG PO TBEC: 81.0000 mg | DELAYED_RELEASE_TABLET | Freq: Every day | ORAL | Status: DC

## 2024-02-25 MED ORDER — ROSUVASTATIN CALCIUM 40 MG PO TABS: 40.0000 mg | ORAL_TABLET | Freq: Every day | ORAL | 3 refills | Status: DC

## 2024-02-25 NOTE — Progress Notes (Addendum)
 Cardiology Office Note:  .   Date:  02/25/2024  ID:  April Armstrong, DOB 1951/11/11, MRN [id number] PCP: Marella Bile  [facility] Providers Cardiologist:  Truett Mainland, MD PCP: Dani Gobble, PA-C  Chief Complaint  Patient presents with   Hypertension   Establish Care   New Patient (Initial Visit)     April Armstrong is a 73 y.o. female with hypertension, mixed hyperlipidemia, h/o cryptogenic stroke  Patient previously saw Dr. Katrinka Blazing and Dr. Ladona Ridgel.  She has been doing well.  She had back surgery during last year or 2.  She has been on Ozempic and has achieved significant weight loss.  She has good energy levels and able to do most things around the house, including taking care of her dogs and chicken etc. without any difficulty.  Reviewed recent lab results with the patient, details below.  He is currently on Crestor 40 mg daily for last several months.  Prior to that, she was on Lipitor 80 mg daily.  She eats fairly heart healthy diet with no red meat, tries to eat more fish.  On second note, she has experienced recent tremors.  She was evaluated by a neurologist at [facility] in 2020 for and was ruled out from Parkinson's, as well as Alzheimer dementia.  However, persistent tremors remain.  In addition, she has had more balance issues lately.  She has had some difficulty with balance ever since her stroke in 2017, but seems to have gotten worse recently.  Patient had loop recorder placed after cryptogenic stroke, but did not find any A-fib.  Subsequently, loop recorder has been explanted.     Vitals:   02/25/24 1420  BP: 114/84  Pulse: 79  Resp: 16  SpO2: 95%      Review of Systems  Cardiovascular:  Negative for chest pain, dyspnea on exertion, leg swelling, palpitations and syncope.  Neurological:  Positive for loss of balance and tremors.        Studies Reviewed: Marland Kitchen        EKG 02/25/2024: Normal sinus rhythm Normal ECG When  compared with ECG of 04-Mar-2021 20:24, Minimal criteria for Anterior infarct are no longer Present T wave inversion no longer evident in Anterior leads  Independently interpreted 11/2023: Chol 222, TG 132, HDL 71, LDL 128 HbA1C 5.7% Hb 14.2 Cr 0.79 TSH 0.30 (0.45-4.5)  Stress test 08/2021: Normal perfusion. LVEF 61% with normal wall motion. This is a low risk study. No changes compared to prior study in 2018.    Physical Exam Vitals and nursing note reviewed.  Constitutional:      General: She is not in acute distress. Neck:     Vascular: No JVD.  Cardiovascular:     Rate and Rhythm: Normal rate and regular rhythm.     Heart sounds: Normal heart sounds. No murmur heard. Pulmonary:     Effort: Pulmonary effort is normal.     Breath sounds: Normal breath sounds. No wheezing or rales.  Musculoskeletal:     Right lower leg: No edema.     Left lower leg: No edema.      VISIT DIAGNOSES:   ICD-10-CM   1. Primary hypertension  I10 EKG 12-Lead    2. H/O: stroke  Z86.73 AMB Referral to [facility] Pharm-D    3. Mixed hyperlipidemia  E78.2 AMB Referral to [facility] Pharm-D       April Armstrong is a 73 y.o. female with hypertension, mixed hyperlipidemia, h/o cryptogenic  stroke  Assessment & Plan  Mixed hyperlipidemia: LDL 128 on Crestor 40 mg daily.  With her prior history of stroke, goal LDL is <70.  I doubt is a dehydration would continue LDL reduction at that extent.  I will refer her to lipid clinic for consideration of PCSK9 elevators or inclisiran.  Hypertension: Well-controlled  H/o cryptogenic stroke: Recommend aspirin 81 mg daily. Lipid management as above. No A-fib was found on loop recorder in the past.  Tremor, loss of balance: Recent worsening of the symptoms.  Loss of balance has been present since her stroke, but much worse now.  Consider neurology referral.    Meds ordered this encounter  Medications   rosuvastatin (CRESTOR) 40 MG tablet     Sig: Take 1 tablet (40 mg total) by mouth daily.    Dispense:  90 tablet    Refill:  3   aspirin EC 81 MG tablet    Sig: Take 1 tablet (81 mg total) by mouth daily. Swallow whole.     F/u in 1 year  Signed, Elder Negus, MD

## 2024-02-25 NOTE — Patient Instructions (Addendum)
 Medication Instructions:  START Aspirin 81 Take one tablet by mouth daily   Refilled Crestor   *If you need a refill on your cardiac medications before your next appointment, please call your pharmacy*  REFERRAL TO LIPID CLINIC  Follow-Up: At [facility], you and your health needs are our priority.  As part of our continuing mission to provide you with exceptional heart care, our providers are all part of one team.  This team includes your primary Cardiologist (physician) and Advanced Practice Providers or APPs (Physician Assistants and Nurse Practitioners) who all work together to provide you with the care you need, when you need it.  Your next appointment:   1 year(s)  Provider:   Elder Negus, MD     We recommend signing up for the patient portal called "MyChart".  Sign up information is provided on this After Visit Summary.  MyChart is used to connect with patients for Virtual Visits (Telemedicine).  Patients are able to view lab/test results, encounter notes, upcoming appointments, etc.  Non-urgent messages can be sent to your provider as well.   To learn more about what you can do with MyChart, go to [email or web address].   Other Instructions      1st Floor: - Lobby - Registration  - Pharmacy  - Lab - Cafe  2nd Floor: - PV Lab - Diagnostic Testing (echo, CT, nuclear med)  3rd Floor: - Vacant  4th Floor: - TCTS (cardiothoracic surgery) - AFib Clinic - Structural Heart Clinic - Vascular Surgery  - Vascular Ultrasound  5th Floor: - HeartCare Cardiology (general and EP) - Clinical Pharmacy for coumadin, hypertension, lipid, weight-loss medications, and med management appointments    Valet parking services will be available as well.

## 2024-03-27 ENCOUNTER — Other Ambulatory Visit: Payer: Self-pay

## 2024-03-27 DIAGNOSIS — G250 Essential tremor: Secondary | ICD-10-CM

## 2024-03-27 NOTE — Progress Notes (Signed)
 Referral placed to neurology per verbal from Dr. Filiberto Hug.

## 2024-04-06 ENCOUNTER — Encounter: Payer: Self-pay | Admitting: Neurology

## 2024-04-13 NOTE — Progress Notes (Addendum)
 Assessment/Plan:   1.  Essential tremor  - Discussed nature and pathophysiology.  She asked me if I thought it to be related to her stroke in 2017, as she thinks it started at that time.  While some infarcts in the basal ganglia can produce tremor, it would be unusual to have a small, posterior right frontal lobe infarct causing bilateral upper extremity tremor.  - I see no evidence of Parkinsons disease.  - She was seen at the memory clinic at [facility] with the PA and they did a skin biopsy for alpha-synuclein which was negative  - She and I talked about treatments for tremor and ultimately we decided on primidone.  A titration was given and that we will work her up to 50 mg twice per day.  We discussed risk, benefits, side effects.  2.  Memory change  - I do not think that the patient has a primary neurodegenerative process.  - Patient is actively following up with [facility] neurology.  She had an MRI brain there about a year ago and her daughter called them about a month ago and they just ordered a PET scan.  Patient is not sure that she wants to follow through with this.  This is certainly up to her.  3.  Fall/balance change  - Patient states that this really has been stable ever since her infarct in 2017.  It has not gotten better or worse.  She was pretty stable in the office today, with the exception of being slightly off balance in the turn.  Physical therapy may be of benefit to her  Subjective:   April Armstrong was seen today in the movement disorders clinic for neurologic consultation at the request of Patwardhan, Kaye Parsons, MD.  Pt with her husband who supplements hx.  The consultation is for the evaluation of essential tremor.  Patient was seen at [facility] neurology years ago for an acute infarct in the posterior right frontal lobe in 2017.  She was seen about a year ago at [facility] neurology by the PA for memory/cognitive evaluation.  It was recommended at that time that she do a skin biopsy  for alpha-synuclein, which she ultimately did in September, 2024.  [facility] notes indicate that the biopsy was negative.  MRI brain was done at [facility] May 24, 2023 that stated "age-appropriate diffuse cerebral volume loss."  There was white matter disease.  It appears that the patient called them about a month ago and they stated at that time that they were going to order a PET scan looking for Alzheimer's pathology. Pt states that her daughter called.   It appears that they are working her up for monoclonal antibodies.  I do not see that she has ever had neurocognitive testing.  Tremor: Yes.     How long has it been going on? "Since the stroke in 2017; I told them all back then that I had tremor and balance tremor."  At rest or with activation?  Both but worse with activation (holding fun; picking up coffee)  Fam hx of tremor?  Brother had Parkinsons Disease   Located where?  Bilateral UE (none in the legs)  Affected by caffeine:  No.  Affected by alcohol:  No.  Affected by stress:  Yes.    Affected by fatigue:  No.  Spills soup if on spoon:  No.  Spills glass of liquid if full:  No.  Affects ADL's (tying shoes, brushing teeth, etc):  No. (Only affects  ability to put on eyeliner)  Other Specific Symptoms:  Voice: no change Wet Pillows: No. Postural symptoms:  she has had some balance difficulties ever since her stroke in 2017; that has not changed over the years but it isn't getting better.  Has done PT for back many years ago but no balance PT recently  Falls?  Yes.   - had near fall this AM but just tripped over husbands shoes and was able to catch herself; she did fall about a week ago and she was cleaning and tripped over the dog bed and fell; a few days earlier, she tripped/fell and nearly hit the fireplace hearth; she estimates that she may fall 2 times per month; some of the falls are due to fact that they raise chickens and they create holes in the backyard that get covered with leaves and she  ends up falling in the yard. Bradykinesia symptoms: some trouble pushing up from the chair lately due to tendinitis in the arm; her husband doesn't think that she shuffles Loss of smell:  no Loss of taste:  no Urinary Incontinence:  No. Difficulty Swallowing:  some troubles with eating chicken Handwriting, micrographia: "its smaller and you can't read it" Depression:  No. But admits to anxiety N/V:  No. Lightheaded:  if gets up too fast  Syncope: No. Diplopia:  No.   ALLERGIES:  No Known Allergies  CURRENT MEDICATIONS:  Current Outpatient Medications  Medication Instructions   ALPRAZolam  (XANAX ) 0.25 mg, Oral, Daily PRN   aspirin  EC 81 mg, Oral, Daily, Swallow whole.   buPROPion  (WELLBUTRIN  XL) 300 mg, Oral, Daily   lamoTRIgine  (LAMICTAL ) 150 mg, Oral, Daily   levothyroxine  (SYNTHROID ) 88 mcg, Daily before breakfast   magnesium gluconate (MAGONATE) 500 mg, 2 times daily   omeprazole (PRILOSEC) 40 mg, Daily   OZEMPIC, 0.25 OR 0.5 MG/DOSE, 2 MG/1.5ML SOPN Inject into the skin.   primidone (MYSOLINE) 50 mg, Oral, 2 times daily   rosuvastatin  (CRESTOR ) 40 mg, Oral, Daily   sertraline  (ZOLOFT ) 100 MG tablet Take one and 1/2 tablets daily.   spironolactone  (ALDACTONE ) 25 mg, Daily   traZODone  (DESYREL ) 150 mg, Oral, Daily at bedtime   Vitamin B-12 1,000 mcg, Daily   Vitamin D  2,000 Units, Daily   vitamin E 100 Units    Objective:   PHYSICAL EXAMINATION:    VITALS:   Vitals:   04/15/24 0822  BP: 118/65  Pulse: 72  SpO2: 98%  Weight: 197 lb 6.4 oz (89.5 kg)  Height: 5\' 3"  (1.6 m)    GEN:  The patient appears stated age and is in NAD. HEENT:  Normocephalic, atraumatic.  The mucous membranes are moist. The superficial temporal arteries are without ropiness or tenderness. CV:  RRR Lungs:  CTAB Neck/HEME:  There are no carotid bruits bilaterally.  Neurological examination:  Orientation: The patient is alert and oriented x3.  She provides her complex history without any  trouble. Cranial nerves: There is good facial symmetry.  Extraocular muscles are intact. The visual fields are full to confrontational testing. The speech is fluent and clear. Soft palate rises symmetrically and there is no tongue deviation. Hearing is intact to conversational tone. Sensation: Sensation is intact to light touch throughout (facial, trunk, extremities). Vibration is intact at the bilateral big toe. There is no extinction with double simultaneous stimulation.  Motor: Strength is 5/5 in the bilateral upper and lower extremities.   Shoulder shrug is equal and symmetric.  There is no pronator drift. Deep  tendon reflexes: Deep tendon reflexes are 2/4 at the bilateral biceps, triceps, brachioradialis, patella and achilles. Plantar responses are downgoing bilaterally.  Movement examination: Tone: There is nl tone in the bilateral upper extremities.  The tone in the lower extremities is nl.  Abnormal movements: there is small amounts of rest tremor that can be felt in the left upper extremity, but not particularly seen.  There is mild postural and intention tremor.  The most significant tremor is when she is pouring water from one glass to another, but overall tremor here is mild to moderate (she does state that tremor is much better today than usual).  She does not spill any significant amount of water.     Coordination:  There is no decremation with RAM's, with any form of RAMS, including alternating supination and pronation of the forearm, hand opening and closing, finger taps, heel taps and toe taps.  Gait and Station: The patient has no difficulty arising out of a deep-seated chair without the use of the hands. The patient's stride length is good with good arm swing.  She is slightly off balance in the turn, but catches herself.   I have reviewed and interpreted the following labs independently   Chemistry      Component Value Date/Time   NA 138 10/09/2021 1415   K 3.7 10/09/2021 1415    CL 102 10/09/2021 1415   CO2 27 10/09/2021 1415   BUN 7 (L) 10/09/2021 1415   CREATININE 0.78 10/09/2021 1415      Component Value Date/Time   CALCIUM  9.1 10/09/2021 1415      Lab Results  Component Value Date   TSH 3.067 04/13/2016   Lab Results  Component Value Date   WBC 8.2 10/09/2021   HGB 14.1 10/09/2021   HCT 43.4 10/09/2021   MCV 93.5 10/09/2021   PLT 247 10/09/2021      Total time spent on today's visit was 60 minutes, including both face-to-face time and nonface-to-face time.  Time included that spent on review of records (prior notes available to me/labs/imaging if pertinent), discussing treatment and goals, answering patient's questions and coordinating care.  Cc:  Jearlean Mince, PA-C

## 2024-04-15 ENCOUNTER — Encounter: Payer: Self-pay | Admitting: Neurology

## 2024-04-15 ENCOUNTER — Ambulatory Visit

## 2024-04-15 ENCOUNTER — Ambulatory Visit (INDEPENDENT_AMBULATORY_CARE_PROVIDER_SITE_OTHER): Admitting: Neurology

## 2024-04-15 VITALS — BP 118/65 | HR 72 | Ht 63.0 in | Wt 197.4 lb

## 2024-04-15 DIAGNOSIS — G250 Essential tremor: Secondary | ICD-10-CM

## 2024-04-15 DIAGNOSIS — R296 Repeated falls: Secondary | ICD-10-CM | POA: Diagnosis not present

## 2024-04-15 DIAGNOSIS — R413 Other amnesia: Secondary | ICD-10-CM

## 2024-04-15 MED ORDER — PRIMIDONE 50 MG PO TABS: 50.0000 mg | ORAL_TABLET | Freq: Two times a day (BID) | ORAL | 1 refills | Status: DC

## 2024-04-15 NOTE — Patient Instructions (Signed)
 Start primidone 50 mg - 1/2 tablet at bedtime for 1 week and then increase to 1 tablet at bedtime x 2 weeks.  If tremor is better, you can stay here but if not, you can then increase to 50 mg twice per day.

## 2024-04-17 ENCOUNTER — Other Ambulatory Visit: Payer: Self-pay | Admitting: Adult Health

## 2024-04-17 DIAGNOSIS — F331 Major depressive disorder, recurrent, moderate: Secondary | ICD-10-CM

## 2024-04-17 DIAGNOSIS — F411 Generalized anxiety disorder: Secondary | ICD-10-CM

## 2024-04-23 ENCOUNTER — Other Ambulatory Visit: Payer: Self-pay | Admitting: Adult Health

## 2024-04-23 DIAGNOSIS — F411 Generalized anxiety disorder: Secondary | ICD-10-CM

## 2024-04-23 DIAGNOSIS — F331 Major depressive disorder, recurrent, moderate: Secondary | ICD-10-CM

## 2024-04-28 ENCOUNTER — Ambulatory Visit: Attending: Cardiology | Admitting: Pharmacist

## 2024-04-28 DIAGNOSIS — E782 Mixed hyperlipidemia: Secondary | ICD-10-CM | POA: Diagnosis present

## 2024-04-28 DIAGNOSIS — I639 Cerebral infarction, unspecified: Secondary | ICD-10-CM | POA: Insufficient documentation

## 2024-04-28 NOTE — Progress Notes (Signed)
 Patient ID: April Armstrong                 DOB: 12-18-1950                    MRN: [id number]      HPI: April Armstrong is a 73 y.o. female patient referred to lipid clinic by Dr. Filiberto Hug. PMH is significant for HDL, HTN, cryptogenic stroke, temor and pre-DM.   Latest lipid panel from 04/21/24 shows LDL-C 109, TG 228, HDL 61, non-HDL cholesterol 148 and TC 209. A1C 5.7  Patient presents today accompanied by her husband.  She previously took atorvastatin  but was switched to rosuvastatin  due to concerns about memory.  Per husband he reports no memory concerns with rosuvastatin .  Patient is active taking care of her chickens and ducks and doing house and yard work.  No formal exercise but is interested in starting to walk after dinner.   Reviewed options for lowering LDL cholesterol, including ezetimibe, PCSK-9 inhibitors, bempedoic acid and inclisiran.  Discussed mechanisms of action, dosing, side effects and potential decreases in LDL cholesterol.  Also reviewed cost information and potential options for patient assistance.   Current Medications: rosuvastatin  40mg  daily Intolerances: atorvastatin  (concerns about memory issues) Risk Factors: CVA, HTN,age LDL-C goal: <70 ApoB goal:   Diet:  Breakfast: not hungry- if she does eat couple boiled eggs and banana Lunch: Dinner: picks around (last night, peas and mac and cheese) Eats a lot of fruit and vegetables Chicken, rare red meat  Downfall is ice cream Drinks: water  Exercise: haul water and feed for ducks and chicken, housework  Family History:  Family History  Problem Relation Age of Onset   Dementia Mother    Hypercholesterolemia Mother    Heart disease Father    Stroke Father    Congestive Heart Failure Sister    Congestive Heart Failure Brother    Breast cancer Paternal Aunt    Healthy Daughter      Social History: no ETOH, no tobacco  Labs: Lipid Panel     Component Value Date/Time   CHOL 167 04/13/2016  0512   TRIG 172 (H) 04/13/2016 0512   HDL 41 04/13/2016 0512   CHOLHDL 4.1 04/13/2016 0512   VLDL 34 04/13/2016 0512   LDLCALC 92 04/13/2016 0512    Past Medical History:  Diagnosis Date   Arthritis    Depression    GERD (gastroesophageal reflux disease)    Hypercholesterolemia    Hyperlipemia    Hypertension    Hypothyroidism    IBS (irritable bowel syndrome)    Stroke (HCC)    Thyroid  disease     Current Outpatient Medications on File Prior to Visit  Medication Sig Dispense Refill   rosuvastatin  (CRESTOR ) 40 MG tablet Take 1 tablet (40 mg total) by mouth daily. 90 tablet 3   ALPRAZolam  (XANAX ) 0.25 MG tablet Take 1 tablet (0.25 mg total) by mouth daily as needed for anxiety. 30 tablet 2   aspirin  EC 81 MG tablet Take 1 tablet (81 mg total) by mouth daily. Swallow whole. (Patient not taking: Reported on 04/15/2024)     buPROPion  (WELLBUTRIN  XL) 300 MG 24 hr tablet Take 1 tablet (300 mg total) by mouth daily. 90 tablet 3   Cholecalciferol  (VITAMIN D ) 50 MCG (2000 UT) tablet Take 2,000 Units by mouth daily.     Cyanocobalamin  (VITAMIN B-12) 1000 MCG SUBL Take 1,000 mcg by mouth daily.     lamoTRIgine  (LAMICTAL )  150 MG tablet TAKE 1 TABLET BY MOUTH DAILY 90 tablet 0   levothyroxine  (SYNTHROID ) 88 MCG tablet Take 88 mcg by mouth daily before breakfast.     magnesium gluconate (MAGONATE) 500 (27 Mg) MG TABS tablet Take 500 mg by mouth in the morning and at bedtime.     omeprazole (PRILOSEC) 40 MG capsule Take 40 mg by mouth daily.      OZEMPIC, 0.25 OR 0.5 MG/DOSE, 2 MG/1.5ML SOPN Inject into the skin. (Patient not taking: Reported on 04/28/2024)     primidone  (MYSOLINE ) 50 MG tablet Take 1 tablet (50 mg total) by mouth 2 (two) times daily. 180 tablet 1   sertraline  (ZOLOFT ) 100 MG tablet Take one and 1/2 tablets daily. 135 tablet 3   spironolactone  (ALDACTONE ) 25 MG tablet Take 25 mg by mouth daily.      traZODone  (DESYREL ) 150 MG tablet Take 1 tablet (150 mg total) by mouth at  bedtime. 90 tablet 3   vitamin E 45 MG (100 UNITS) capsule Take 100 Units by mouth.     No current facility-administered medications on file prior to visit.    No Known Allergies  Assessment/Plan:  1. Hyperlipidemia -  Hyperlipidemia Assessment: LDL-C is above goal of less than 70 Reviewed reason for cholesterol goals Tolerating rosuvastatin  40 mg daily Reviewed medication options with patient including side effects Injection technique for Repatha reviewed  Plan: Submit prior authorization for Repatha We will need to get healthwell grant Labs in 3 months-she will be seeing her primary around that time and will get labs with primary Start walking daily after dinner    Thank you,  April Armstrong D Raivyn Kabler, Pharm.Monika Annas, CPP [facility] [address], [place], [place] [address]  Phone: [phone]; Fax: [phone]

## 2024-04-28 NOTE — Patient Instructions (Addendum)
 I will submit a prior authorization for Repatha. I will call you once I hear back. Please call me at [phone] with any questions.   Repatha is a cholesterol medication that improved your body's ability to get rid of "bad cholesterol" known as LDL. It can lower your LDL up to 60%! It is an injection that is given under the skin every 2 weeks. The medication often requires a prior authorization from your insurance company. We will take care of submitting all the necessary information to your insurance company to get it approved. The most common side effects of Repatha include runny nose, symptoms of the common cold, rarely flu or flu-like symptoms, back/muscle pain in about 3-4% of the patients, and redness, pain, or bruising at the injection site. Tell your healthcare provider if you have any side effect that bothers you or that does not go away.   Continue rosuvastatin  40mg  daily

## 2024-04-28 NOTE — Assessment & Plan Note (Signed)
 Assessment: LDL-C is above goal of less than 70 Reviewed reason for cholesterol goals Tolerating rosuvastatin  40 mg daily Reviewed medication options with patient including side effects Injection technique for Repatha reviewed  Plan: Submit prior authorization for Repatha We will need to get healthwell grant Labs in 3 months-she will be seeing her primary around that time and will get labs with primary Start walking daily after dinner

## 2024-04-29 ENCOUNTER — Telehealth: Payer: Self-pay

## 2024-04-29 ENCOUNTER — Other Ambulatory Visit (HOSPITAL_COMMUNITY): Payer: Self-pay

## 2024-04-29 ENCOUNTER — Telehealth: Payer: Self-pay | Admitting: Pharmacy Technician

## 2024-04-29 MED ORDER — REPATHA SURECLICK 140 MG/ML ~~LOC~~ SOAJ: 1.0000 mL | SUBCUTANEOUS | 11 refills | Status: DC

## 2024-04-29 NOTE — Telephone Encounter (Signed)
-----   Message from Benancio Bracket sent at 04/28/2024  4:22 PM EDT ----- Please do PA for Repatha

## 2024-04-29 NOTE — Telephone Encounter (Signed)
 Please get patient healthwell grant and call info into [employer]. Thanks!

## 2024-04-29 NOTE — Telephone Encounter (Signed)
 Pharmacy Patient Advocate Encounter  Received notification from [employer] that Prior Authorization for REPATHA has been APPROVED from 04/29/24 to 10/29/24. Ran test claim, Copay is $47. This test claim was processed through [facility]- copay amounts may vary at other pharmacies due to pharmacy/plan contracts, or as the patient moves through the different stages of their insurance plan.

## 2024-04-29 NOTE — Telephone Encounter (Signed)
 Patient Advocate Encounter   The patient was approved for a Healthwell grant that will help cover the cost of Repatha Total amount awarded, 2500.00.  Effective: 03/30/24 - 03/29/25   [id number] [id number] [id number]  Healthwell ID: [id number]   Pharmacy provided with approval and processing information. Patient informed via call VM/mychart

## 2024-04-29 NOTE — Addendum Note (Signed)
 Addended by: Yanni Ruberg D on: 04/29/2024 04:10 PM   Modules accepted: Orders

## 2024-04-29 NOTE — Telephone Encounter (Signed)
 Pharmacy Patient Advocate Encounter   Received notification from Physician's Office that prior authorization for REPATHA is required/requested.   Insurance verification completed.   The patient is insured through [facility] .   Per test claim: PA required; PA submitted to above mentioned insurance via CoverMyMeds Key/confirmation #/EOC [id number] Status is pending

## 2024-05-27 ENCOUNTER — Other Ambulatory Visit: Payer: Self-pay | Admitting: Adult Health

## 2024-05-27 DIAGNOSIS — F331 Major depressive disorder, recurrent, moderate: Secondary | ICD-10-CM

## 2024-05-27 DIAGNOSIS — F411 Generalized anxiety disorder: Secondary | ICD-10-CM

## 2024-05-31 NOTE — Telephone Encounter (Signed)
 Please call patient to schedule FU, is past due. RF sent.

## 2024-06-01 NOTE — Telephone Encounter (Signed)
 Lvm for patient to call and schedule

## 2024-07-07 ENCOUNTER — Other Ambulatory Visit: Payer: Self-pay | Admitting: Adult Health

## 2024-07-07 DIAGNOSIS — F331 Major depressive disorder, recurrent, moderate: Secondary | ICD-10-CM

## 2024-07-07 DIAGNOSIS — F411 Generalized anxiety disorder: Secondary | ICD-10-CM

## 2024-07-28 ENCOUNTER — Encounter: Payer: Self-pay | Admitting: Adult Health

## 2024-07-28 ENCOUNTER — Ambulatory Visit (INDEPENDENT_AMBULATORY_CARE_PROVIDER_SITE_OTHER): Admitting: Adult Health

## 2024-07-28 DIAGNOSIS — F411 Generalized anxiety disorder: Secondary | ICD-10-CM

## 2024-07-28 DIAGNOSIS — F331 Major depressive disorder, recurrent, moderate: Secondary | ICD-10-CM

## 2024-07-28 DIAGNOSIS — F410 Panic disorder [episodic paroxysmal anxiety] without agoraphobia: Secondary | ICD-10-CM

## 2024-07-28 DIAGNOSIS — G4700 Insomnia, unspecified: Secondary | ICD-10-CM | POA: Diagnosis not present

## 2024-07-28 MED ORDER — LAMOTRIGINE 150 MG PO TABS: 150.0000 mg | ORAL_TABLET | Freq: Every day | ORAL | 0 refills | Status: DC

## 2024-07-28 MED ORDER — TRAZODONE HCL 150 MG PO TABS: 150.0000 mg | ORAL_TABLET | Freq: Every day | ORAL | 0 refills | Status: AC

## 2024-07-28 MED ORDER — SERTRALINE HCL 100 MG PO TABS: ORAL_TABLET | ORAL | 0 refills | Status: DC

## 2024-07-28 MED ORDER — BUPROPION HCL ER (XL) 300 MG PO TB24: 300.0000 mg | ORAL_TABLET | Freq: Every day | ORAL | 0 refills | Status: DC

## 2024-07-28 NOTE — Progress Notes (Signed)
 April Armstrong [id number] 18-Jun-1951 73 y.o.  Virtual Visit via Video Note  I connected with pt @ on 07/28/24 at  4:00 PM EDT by a video enabled telemedicine application and verified that I am speaking with the correct person using two identifiers.   I discussed the limitations of evaluation and management by telemedicine and the availability of in person appointments. The patient expressed understanding and agreed to proceed.  I discussed the assessment and treatment plan with the patient. The patient was provided an opportunity to ask questions and all were answered. The patient agreed with the plan and demonstrated an understanding of the instructions.   The patient was advised to call back or seek an in-person evaluation if the symptoms worsen or if the condition fails to improve as anticipated.  I provided 25 minutes of non-face-to-face time during this encounter.  The patient was located at home.  The provider was located at [facility].   April LOISE Sayers, NP   Subjective:   Patient ID:  April Armstrong is a 73 y.o. (DOB [birth year]) female.  Chief Complaint: No chief complaint on file.   HPI April Armstrong presents for follow-up of MDD, GAD, insomnia and panic attacks.   Describes mood today as ok. Pleasant. Mood symptoms - denies depression. Reports stable interest and motivation. Reports situational anxiety. Denies panic attacks. Denies irritability. Reports some worry, rumination and over thinking. Reports mood is stable. Stating I feel like I'm doing ok. Taking medications as prescribed.  Energy levels improved. Active, does not have a regular exercise routine.  Enjoys some usual interests and activities. Married. Lives with husband 2 dogs. Has 3 daughters and 9 grandchildren.  Appetite adequate. Reports weight loss - using Ozempic when she can get it. Sleeps well most nights. Averages 5 to 6 hours.  Focus and concentration stable. Completing tasks.  Managing aspects of household.  Denies SI or HI.  Denies AH or VH. Denies self harm.  Denies substance use.  Review of Systems:  Review of Systems  Musculoskeletal:  Negative for gait problem.  Neurological:  Negative for tremors.  Psychiatric/Behavioral:         Please refer to HPI    Medications: I have reviewed the patient's current medications.  Current Outpatient Medications  Medication Sig Dispense Refill   ALPRAZolam  (XANAX ) 0.25 MG tablet Take 1 tablet (0.25 mg total) by mouth daily as needed for anxiety. 30 tablet 2   aspirin  EC 81 MG tablet Take 1 tablet (81 mg total) by mouth daily. Swallow whole. (Patient not taking: Reported on 04/15/2024)     buPROPion  (WELLBUTRIN  XL) 300 MG 24 hr tablet TAKE 1 TABLET BY MOUTH DAILY 30 tablet 0   Cholecalciferol  (VITAMIN D ) 50 MCG (2000 UT) tablet Take 2,000 Units by mouth daily.     Cyanocobalamin  (VITAMIN B-12) 1000 MCG SUBL Take 1,000 mcg by mouth daily.     Evolocumab  (REPATHA  SURECLICK) 140 MG/ML SOAJ Inject 140 mg into the skin every 14 (fourteen) days. 2 mL 11   lamoTRIgine  (LAMICTAL ) 150 MG tablet TAKE 1 TABLET BY MOUTH DAILY 30 tablet 0   levothyroxine  (SYNTHROID ) 88 MCG tablet Take 88 mcg by mouth daily before breakfast.     magnesium gluconate (MAGONATE) 500 (27 Mg) MG TABS tablet Take 500 mg by mouth in the morning and at bedtime.     omeprazole (PRILOSEC) 40 MG capsule Take 40 mg by mouth daily.      OZEMPIC, 0.25 OR 0.5 MG/DOSE, 2  MG/1.5ML SOPN Inject into the skin. (Patient not taking: Reported on 04/28/2024)     primidone  (MYSOLINE ) 50 MG tablet Take 1 tablet (50 mg total) by mouth 2 (two) times daily. 180 tablet 1   rosuvastatin  (CRESTOR ) 40 MG tablet Take 1 tablet (40 mg total) by mouth daily. 90 tablet 3   sertraline  (ZOLOFT ) 100 MG tablet Take one and 1/2 tablets daily. 135 tablet 3   spironolactone  (ALDACTONE ) 25 MG tablet Take 25 mg by mouth daily.      traZODone  (DESYREL ) 150 MG tablet Take 1 tablet (150 mg total) by  mouth at bedtime. 90 tablet 3   vitamin E 45 MG (100 UNITS) capsule Take 100 Units by mouth.     No current facility-administered medications for this visit.    Medication Side Effects: None  Allergies: No Known Allergies  Past Medical History:  Diagnosis Date   Arthritis    Depression    GERD (gastroesophageal reflux disease)    Hypercholesterolemia    Hyperlipemia    Hypertension    Hypothyroidism    IBS (irritable bowel syndrome)    Stroke (HCC)    Thyroid  disease     Family History  Problem Relation Age of Onset   Dementia Mother    Hypercholesterolemia Mother    Heart disease Father    Stroke Father    Congestive Heart Failure Sister    Congestive Heart Failure Brother    Breast cancer Paternal Aunt    Healthy Daughter     Social History   Socioeconomic History   Marital status: Married    Spouse name: Not on file   Number of children: Not on file   Years of education: Not on file   Highest education level: Not on file  Occupational History   Not on file  Tobacco Use   Smoking status: Never   Smokeless tobacco: Former    Types: Engineer, drilling   Vaping status: Never Used  Substance and Sexual Activity   Alcohol use: No   Drug use: No   Sexual activity: Not on file  Other Topics Concern   Not on file  Social History Narrative   Right handed    Social Drivers of Health   Financial Resource Strain: Low Risk  (12/03/2023)   Received from [facility]   Overall Financial Resource Strain (CARDIA)    Difficulty of Paying Living Expenses: Not very hard  Food Insecurity: No Food Insecurity (12/03/2023)   Received from [facility]   Hunger Vital Sign    Within the past 12 months, you worried that your food would run out before you got the money to buy more.: Never true    Within the past 12 months, the food you bought just didn't last and you didn't have money to get more.: Never true  Transportation Needs: No Transportation Needs (12/03/2023)    Received from [facility] - Transportation    Lack of Transportation (Medical): No    Lack of Transportation (Non-Medical): No  Physical Activity: Unknown (09/19/2023)   Received from [facility]   Exercise Vital Sign    On average, how many days per week do you engage in moderate to strenuous exercise (like a brisk walk)?: 0 days    Minutes of Exercise per Session: Not on file  Stress: Stress Concern Present (09/19/2023)   Received from [facility] - Occupational Stress Questionnaire    Feeling  of Stress : To some extent  Social Connections: Socially Integrated (09/19/2023)   Received from [facility]   Social Network    How would you rate your social network (family, work, friends)?: Good participation with social networks  Intimate Partner Violence: Not At Risk (09/19/2023)   Received from [facility]   HITS    Over the last 12 months how often did your partner physically hurt you?: Never    Over the last 12 months how often did your partner insult you or talk down to you?: Never    Over the last 12 months how often did your partner threaten you with physical harm?: Never    Over the last 12 months how often did your partner scream or curse at you?: Never    Past Medical History, Surgical history, Social history, and Family history were reviewed and updated as appropriate.   Please see review of systems for further details on the patient's review from today.   Objective:   Physical Exam:  There were no vitals taken for this visit.  Physical Exam Constitutional:      General: She is not in acute distress. Musculoskeletal:        General: No deformity.  Neurological:     Mental Status: She is alert and oriented to person, place, and time.     Coordination: Coordination normal.  Psychiatric:        Attention and Perception: Attention and perception normal. She does not perceive auditory or visual  hallucinations.        Mood and Affect: Mood normal. Mood is not anxious or depressed. Affect is not labile, blunt, angry or inappropriate.        Speech: Speech normal.        Behavior: Behavior normal.        Thought Content: Thought content normal. Thought content is not paranoid or delusional. Thought content does not include homicidal or suicidal ideation. Thought content does not include homicidal or suicidal plan.        Cognition and Memory: Cognition and memory normal.        Judgment: Judgment normal.     Comments: Insight intact     Lab Review:     Component Value Date/Time   NA 138 10/09/2021 1415   K 3.7 10/09/2021 1415   CL 102 10/09/2021 1415   CO2 27 10/09/2021 1415   GLUCOSE 106 (H) 10/09/2021 1415   BUN 7 (L) 10/09/2021 1415   CREATININE 0.78 10/09/2021 1415   CALCIUM  9.1 10/09/2021 1415   GFRNONAA >60 10/09/2021 1415   GFRAA >60 04/12/2016 1925       Component Value Date/Time   WBC 8.2 10/09/2021 1415   RBC 4.64 10/09/2021 1415   HGB 14.1 10/09/2021 1415   HCT 43.4 10/09/2021 1415   PLT 247 10/09/2021 1415   MCV 93.5 10/09/2021 1415   MCH 30.4 10/09/2021 1415   MCHC 32.5 10/09/2021 1415   RDW 12.9 10/09/2021 1415    No results found for: POCLITH, LITHIUM   No results found for: PHENYTOIN, PHENOBARB, VALPROATE, CBMZ   .res Assessment: Plan:    Plan:  Zoloft  150mg  daily Lamictal  150mg  daily Wellbutrin  XL 300mg  daily Trazadone 150mg  daily  Xanax  0.25mg  daily - 30 tabs once for grief/loss  RTC 3 months  25 minutes spent dedicated to the care of this patient on the date of this encounter to include pre-visit review of records, ordering of medication, post visit documentation, and face-to-face  time with the patient discussing depression, anxiety, insomnia and obsessional thoughts. Discussed continuing current medication regimen.  Patient advised to contact office with any questions, adverse effects, or acute worsening in signs and  symptoms.  Counseled patient regarding potential benefits, risks, and side effects of Lamictal  to include potential risk of Stevens-Johnson syndrome. Advised patient to stop taking Lamictal  and contact office immediately if rash develops and to seek urgent medical attention if rash is severe and/or spreading quickly.  Discussed potential benefits, risk, and side effects of benzodiazepines to include potential risk of tolerance and dependence, as well as possible drowsiness. Advised patient not to drive if experiencing drowsiness and to take lowest possible effective dose to minimize risk of dependence and tolerance.  There are no diagnoses linked to this encounter.   Please see After Visit Summary for patient specific instructions.  Future Appointments  Date Time Provider Department Center  07/28/2024  4:00 PM Estalee Mccandlish, April Mattocks, NP [facility] None  09/21/2024  2:00 PM Tat, Asberry RAMAN, DO [facility] None    No orders of the defined types were placed in this encounter.     -------------------------------

## 2024-08-25 ENCOUNTER — Encounter: Payer: Self-pay | Admitting: Pharmacist

## 2024-08-25 ENCOUNTER — Other Ambulatory Visit: Payer: Self-pay | Admitting: Pharmacist

## 2024-08-25 ENCOUNTER — Other Ambulatory Visit: Payer: Self-pay | Admitting: Adult Health

## 2024-08-25 DIAGNOSIS — F331 Major depressive disorder, recurrent, moderate: Secondary | ICD-10-CM

## 2024-08-25 DIAGNOSIS — F411 Generalized anxiety disorder: Secondary | ICD-10-CM

## 2024-08-25 MED ORDER — ROSUVASTATIN CALCIUM 20 MG PO TABS: 20.0000 mg | ORAL_TABLET | Freq: Every day | ORAL | 3 refills | Status: AC

## 2024-09-18 NOTE — Progress Notes (Signed)
 Assessment/Plan:    1.  Essential tremor             - Increase primidone , 50 mg twice per day.  My intention was to have her take it this way previously, but she misunderstood and has only been taking it once per day.  - Skin biopsy was done through the memory clinic at [facility] for alpha-synuclein and that was negative.  They asked about the skin biopsy today and what it was for and we discussed this.  She does have just a bit of rest tremor today again.  However, no other further evidence of bradykinesia.   2.  Memory change             - I am not fully convinced that the patient has a primary neurodegenerative process.             - Patient has actively been following up with [facility] neurolog[facility].  She previously had an MRI of the brain at [facility] and her daughter had wanted her to have a PET scan, which they ordered, but the patient ultimately had decided to hold off on that.  After some discussion today, she and her husband decided that they just did not want to travel that far and are going to start seeing Camie here.  They know that we do not offer the monoclonal antibodies here.  She has not had a lot of testing at [facility], so further testing here would be valuable, including neurocognitive testing, labs and potentially PET scan if indicated.   3.  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  It has not gotten better or worse.  She was pretty stable in the office today, with the exception of being slightly off balance in the turn.  Physical therapy may be of benefit to her.  We discussed this today and last visit as well.  Subjective:   April Armstrong was seen today in follow up for essential tremor.  Pt with husband who supplements hx.  My previous records were reviewed prior to todays visit. Pt was started on primidone , 50 mg twice per day.  She reports she is only taking it once per day; she didn't realize she was supposed to  take it bid.  She is tolerating medication well, without side effects.  She reports that its helping some but husband isn't so convinced.  They ask about memory.  She was supposed to have a PET scan at [facility] but ended up not doing them.  She has a follow-up in April, 2026 but they really would rather not travel that far.  Current prescribed movement disorder medications: Primidone , 50 mg twice per day (started last visit)    ALLERGIES:  No Known Allergies  CURRENT MEDICATIONS:  Current Meds  Medication Sig   ALPRAZolam  (XANAX ) 0.25 MG tablet Take 1 tablet (0.25 mg total) by mouth daily as needed for anxiety.   lamoTRIgine  (LAMICTAL ) 150 MG tablet TAKE 1 TABLET BY MOUTH DAILY   levothyroxine  (SYNTHROID ) 88 MCG tablet Take 88 mcg by mouth daily before breakfast.   magnesium 30 MG tablet Take 30 mg by mouth 2 (two) times daily.   magnesium gluconate (MAGONATE) 500 (27 Mg) MG TABS tablet Take 500 mg by mouth in the morning and at bedtime.   omeprazole (PRILOSEC) 40 MG capsule Take 40 mg by  mouth daily.    OZEMPIC, 0.25 OR 0.5 MG/DOSE, 2 MG/1.5ML SOPN Inject into the skin.   primidone  (MYSOLINE ) 50 MG tablet Take 1 tablet (50 mg total) by mouth 2 (two) times daily.   rosuvastatin  (CRESTOR ) 20 MG tablet Take 1 tablet (20 mg total) by mouth daily.   sertraline  (ZOLOFT ) 100 MG tablet Take one and 1/2 tablets daily.   spironolactone  (ALDACTONE ) 25 MG tablet Take 25 mg by mouth daily.    traZODone  (DESYREL ) 150 MG tablet Take 1 tablet (150 mg total) by mouth at bedtime.   vitamin B-12 (CYANOCOBALAMIN ) 100 MCG tablet Take 100 mcg by mouth daily.   vitamin E 45 MG (100 UNITS) capsule Take 100 Units by mouth.      Objective:    PHYSICAL EXAMINATION:    VITALS:   Vitals:   09/21/24 1352  BP: 133/88  Pulse: 72  SpO2: 98%    GEN:  The patient appears stated age and is in NAD. HEENT:  Normocephalic, atraumatic.  The mucous membranes are moist. The superficial temporal arteries are without  ropiness or tenderness. CV:  RRR Lungs:  CTAB Neck/HEME:  There are no carotid bruits bilaterally.  Neurological examination:  Orientation: The patient is alert and oriented x3. Cranial nerves: There is good facial symmetry. The speech is fluent and clear. Soft palate rises symmetrically and there is no tongue deviation. Hearing is intact to conversational tone. Sensation: Sensation is intact to light touch throughout Motor: Strength is at least antigravity x4.  Movement examination: Tone: There is nl tone in the bilateral upper extremities.  The tone in the lower extremities is nl.  Abnormal movements: there is small amounts of rest tremor bilaterally and mild postural and intention tremor.  She has mild trouble with Archimedes spirals bilaterally.  Coordination:  There is no decremation with RAM's, with any form of RAMS, including alternating supination and pronation of the forearm, hand opening and closing, finger taps, heel taps and toe taps.  Gait and Station: The patient pushes off to arise.  Stride length is good.  Armswing is good.  Very minimal trouble in the turn. I have reviewed and interpreted the following labs independently   Chemistry      Component Value Date/Time   NA 138 10/09/2021 1415   K 3.7 10/09/2021 1415   CL 102 10/09/2021 1415   CO2 27 10/09/2021 1415   BUN 7 (L) 10/09/2021 1415   CREATININE 0.78 10/09/2021 1415      Component Value Date/Time   CALCIUM  9.1 10/09/2021 1415      Lab Results  Component Value Date   WBC 8.2 10/09/2021   HGB 14.1 10/09/2021   HCT 43.4 10/09/2021   MCV 93.5 10/09/2021   PLT 247 10/09/2021   Lab Results  Component Value Date   TSH 3.067 04/13/2016     Chemistry      Component Value Date/Time   NA 138 10/09/2021 1415   K 3.7 10/09/2021 1415   CL 102 10/09/2021 1415   CO2 27 10/09/2021 1415   BUN 7 (L) 10/09/2021 1415   CREATININE 0.78 10/09/2021 1415      Component Value Date/Time   CALCIUM  9.1 10/09/2021  1415      Total time spent on today's visit was 39 minutes, including both face-to-face time and nonface-to-face time.  Time included that spent on review of records (prior notes available to me/labs/imaging if pertinent), discussing treatment and goals, answering patient's questions and coordinating care.  Cc:  Jacques Camie Pepper, PA-C

## 2024-09-21 ENCOUNTER — Ambulatory Visit (INDEPENDENT_AMBULATORY_CARE_PROVIDER_SITE_OTHER): Admitting: Neurology

## 2024-09-21 ENCOUNTER — Encounter: Payer: Self-pay | Admitting: Neurology

## 2024-09-21 VITALS — BP 133/88 | HR 72

## 2024-09-21 DIAGNOSIS — R413 Other amnesia: Secondary | ICD-10-CM | POA: Diagnosis not present

## 2024-09-21 DIAGNOSIS — G250 Essential tremor: Secondary | ICD-10-CM | POA: Diagnosis not present

## 2024-09-21 NOTE — Patient Instructions (Signed)
 Increase primidone  50 mg to twice per day.    The physicians and staff at [facility] are committed to providing excellent care. You may receive a survey requesting feedback about your experience at our office. We strive to receive very good responses to the survey questions. If you feel that your experience would prevent you from giving the office a very good  response, please contact our office to try to remedy the situation. We may be reached at [phone]. Thank you for taking the time out of your busy day to complete the survey.

## 2024-10-16 ENCOUNTER — Other Ambulatory Visit: Payer: Self-pay | Admitting: Neurology

## 2024-10-24 ENCOUNTER — Other Ambulatory Visit: Payer: Self-pay | Admitting: Adult Health

## 2024-10-24 DIAGNOSIS — F331 Major depressive disorder, recurrent, moderate: Secondary | ICD-10-CM

## 2024-10-24 DIAGNOSIS — F411 Generalized anxiety disorder: Secondary | ICD-10-CM

## 2024-10-25 ENCOUNTER — Other Ambulatory Visit: Payer: Self-pay | Admitting: Adult Health

## 2024-10-25 DIAGNOSIS — F411 Generalized anxiety disorder: Secondary | ICD-10-CM

## 2024-10-25 NOTE — Telephone Encounter (Signed)
 Please call to schedule FU, due now.

## 2024-10-26 NOTE — Telephone Encounter (Signed)
 Lvm for pt to call and schedule

## 2024-11-21 ENCOUNTER — Other Ambulatory Visit: Payer: Self-pay | Admitting: Adult Health

## 2024-11-21 DIAGNOSIS — F411 Generalized anxiety disorder: Secondary | ICD-10-CM

## 2024-11-21 DIAGNOSIS — F331 Major depressive disorder, recurrent, moderate: Secondary | ICD-10-CM

## 2024-11-22 NOTE — Telephone Encounter (Signed)
 Please call to schedule FU

## 2024-11-27 NOTE — Telephone Encounter (Signed)
 LVM @ 2:29p for pt to call back and schedule appt

## 2024-12-08 ENCOUNTER — Ambulatory Visit: Admitting: Physician Assistant

## 2024-12-14 ENCOUNTER — Telehealth: Payer: Self-pay | Admitting: Adult Health

## 2024-12-14 DIAGNOSIS — F331 Major depressive disorder, recurrent, moderate: Secondary | ICD-10-CM

## 2024-12-14 DIAGNOSIS — F411 Generalized anxiety disorder: Secondary | ICD-10-CM

## 2024-12-14 MED ORDER — LAMOTRIGINE 150 MG PO TABS: 150.0000 mg | ORAL_TABLET | Freq: Every day | ORAL | 0 refills | Status: AC

## 2024-12-14 NOTE — Telephone Encounter (Signed)
 A 30-day supply sent to the requested pharmacy. She has an appt tomorrow. LVM to RC.
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
## 2024-12-15 ENCOUNTER — Other Ambulatory Visit: Payer: Self-pay

## 2024-12-15 ENCOUNTER — Ambulatory Visit: Admitting: Adult Health

## 2024-12-15 DIAGNOSIS — F411 Generalized anxiety disorder: Secondary | ICD-10-CM

## 2024-12-15 DIAGNOSIS — F331 Major depressive disorder, recurrent, moderate: Secondary | ICD-10-CM

## 2024-12-15 DIAGNOSIS — Z0389 Encounter for observation for other suspected diseases and conditions ruled out: Secondary | ICD-10-CM

## 2024-12-15 MED ORDER — SERTRALINE HCL 100 MG PO TABS: ORAL_TABLET | ORAL | 0 refills | Status: AC

## 2024-12-15 NOTE — Progress Notes (Signed)
 Patient in [place]  - unable to have an appointment.

## 2024-12-21 ENCOUNTER — Other Ambulatory Visit: Payer: Self-pay | Admitting: Adult Health

## 2024-12-21 DIAGNOSIS — F331 Major depressive disorder, recurrent, moderate: Secondary | ICD-10-CM

## 2024-12-21 DIAGNOSIS — F411 Generalized anxiety disorder: Secondary | ICD-10-CM

## 2024-12-22 ENCOUNTER — Telehealth: Payer: Self-pay | Admitting: Pharmacy Technician

## 2024-12-22 ENCOUNTER — Other Ambulatory Visit (HOSPITAL_COMMUNITY): Payer: Self-pay

## 2024-12-22 ENCOUNTER — Telehealth: Payer: Self-pay | Admitting: Pharmacist

## 2024-12-22 NOTE — Telephone Encounter (Signed)
 Pharmacy Patient Advocate Encounter   Received notification from Physician's Office that prior authorization for Ozempic is required/requested.   Insurance verification completed.   The patient is insured through [employer].   Per test claim: PA required; PA submitted to above mentioned insurance via Latent Key/confirmation #/EOC [id number] Status is pending

## 2024-12-22 NOTE — Telephone Encounter (Signed)
 Pharmacy Patient Advocate Encounter  Received notification from [employer] that Prior Authorization for repatha  has been APPROVED from 11/26/24 to 11/25/25. Ran test claim, Copay is $543.83- one month..  deductible. This test claim was processed through [facility]- copay amounts may vary at other pharmacies due to pharmacy/plan contracts, or as the patient moves through the different stages of their insurance plan.   PA #/Case ID/Reference #: [id number]

## 2024-12-22 NOTE — Telephone Encounter (Signed)
 Pharmacy Patient Advocate Encounter   Received notification from Physician's Office that prior authorization for Repatha  is required/requested.   Insurance verification completed.   The patient is insured through [employer].   Per test claim: PA required; PA submitted to above mentioned insurance via Latent Key/confirmation #/EOC [id number] Status is pending

## 2024-12-22 NOTE — Telephone Encounter (Signed)
 Please update PA for Repatha  and Ozempic

## 2024-12-23 ENCOUNTER — Other Ambulatory Visit (HOSPITAL_COMMUNITY): Payer: Self-pay

## 2024-12-23 NOTE — Telephone Encounter (Signed)
 Pharmacy Patient Advocate Encounter  Received notification from [employer] that Prior Authorization for Ozempic has been APPROVED from 11/26/24 to 11/25/25. Ran test claim, Copay is $627.18- one month DEDUCTIBLE. This test claim was processed through [facility]- copay amounts may vary at other pharmacies due to pharmacy/plan contracts, or as the patient moves through the different stages of their insurance plan.   PA #/Case ID/Reference #: [id number]

## 2024-12-28 MED ORDER — REPATHA SURECLICK 140 MG/ML ~~LOC~~ SOAJ: 1.0000 mL | SUBCUTANEOUS | 11 refills | Status: AC

## 2024-12-28 NOTE — Addendum Note (Signed)
 Addended by: DARRELL BAREFOOT on: 12/28/2024 11:50 AM   Modules accepted: Orders

## 2025-01-29 ENCOUNTER — Ambulatory Visit: Admitting: Physician Assistant

## 2025-03-23 ENCOUNTER — Ambulatory Visit: Admitting: Neurology
